# Patient Record
Sex: Male | Born: 1970 | Race: Black or African American | Hispanic: No | Marital: Single | Smoking: Current every day smoker
Health system: Southern US, Community
[De-identification: ages and names within clinical notes are randomized; demographics above are authoritative.]

## PROBLEM LIST (undated history)

## (undated) DIAGNOSIS — C8190 Hodgkin lymphoma, unspecified, unspecified site: Secondary | ICD-10-CM

## (undated) DIAGNOSIS — R590 Localized enlarged lymph nodes: Secondary | ICD-10-CM

## (undated) DIAGNOSIS — J40 Bronchitis, not specified as acute or chronic: Secondary | ICD-10-CM

## (undated) HISTORY — PX: INCISION AND DRAINAGE PERITONSILLAR ABSCESS: SUR670

## (undated) HISTORY — PX: WRIST SURGERY: SHX841

## (undated) HISTORY — PX: KNEE SURGERY: SHX244

---

## 2001-04-27 ENCOUNTER — Inpatient Hospital Stay (HOSPITAL_COMMUNITY): Admission: EM | Admit: 2001-04-27 | Discharge: 2001-04-29 | Payer: Self-pay | Admitting: Emergency Medicine

## 2001-05-18 ENCOUNTER — Encounter: Admission: RE | Admit: 2001-05-18 | Discharge: 2001-08-16 | Payer: Self-pay | Admitting: Orthopedic Surgery

## 2001-08-17 ENCOUNTER — Encounter: Admission: RE | Admit: 2001-08-17 | Discharge: 2001-09-06 | Payer: Self-pay | Admitting: Orthopedic Surgery

## 2002-03-28 ENCOUNTER — Emergency Department (HOSPITAL_COMMUNITY): Admission: EM | Admit: 2002-03-28 | Discharge: 2002-03-28 | Payer: Self-pay | Admitting: Emergency Medicine

## 2002-08-23 ENCOUNTER — Emergency Department (HOSPITAL_COMMUNITY): Admission: EM | Admit: 2002-08-23 | Discharge: 2002-08-23 | Payer: Self-pay | Admitting: Emergency Medicine

## 2002-08-23 ENCOUNTER — Encounter: Payer: Self-pay | Admitting: Emergency Medicine

## 2003-09-09 ENCOUNTER — Emergency Department (HOSPITAL_COMMUNITY): Admission: EM | Admit: 2003-09-09 | Discharge: 2003-09-10 | Payer: Self-pay | Admitting: Emergency Medicine

## 2004-06-22 ENCOUNTER — Emergency Department (HOSPITAL_COMMUNITY): Admission: EM | Admit: 2004-06-22 | Discharge: 2004-06-22 | Payer: Self-pay | Admitting: Emergency Medicine

## 2005-10-23 ENCOUNTER — Emergency Department (HOSPITAL_COMMUNITY): Admission: EM | Admit: 2005-10-23 | Discharge: 2005-10-23 | Payer: Self-pay | Admitting: Emergency Medicine

## 2010-04-12 ENCOUNTER — Emergency Department (HOSPITAL_COMMUNITY): Admission: EM | Admit: 2010-04-12 | Discharge: 2010-04-12 | Payer: Self-pay | Admitting: Emergency Medicine

## 2010-11-13 LAB — BASIC METABOLIC PANEL
Chloride: 95 mEq/L — ABNORMAL LOW (ref 96–112)
Potassium: 3.4 mEq/L — ABNORMAL LOW (ref 3.5–5.1)

## 2010-11-13 LAB — DIFFERENTIAL
Basophils Absolute: 0 10*3/uL (ref 0.0–0.1)
Eosinophils Absolute: 0 10*3/uL (ref 0.0–0.7)
Eosinophils Relative: 0 % (ref 0–5)
Lymphs Abs: 1.6 10*3/uL (ref 0.7–4.0)
Monocytes Absolute: 1.2 10*3/uL — ABNORMAL HIGH (ref 0.1–1.0)
Monocytes Relative: 12 % (ref 3–12)
Neutrophils Relative %: 72 % (ref 43–77)

## 2010-11-13 LAB — CBC
HCT: 44.4 % (ref 39.0–52.0)
MCH: 30.2 pg (ref 26.0–34.0)
MCHC: 33.7 g/dL (ref 30.0–36.0)
Platelets: 173 10*3/uL (ref 150–400)

## 2011-01-15 NOTE — Op Note (Signed)
[place]  Patient:    Robert Acevedo, [facility] Visit Number: [id number] MRN: [id number]          Service Type: MED Location: [id number] Attending Physician:  Dominica Severin Dictated by:   Elisha Ponder, M.D. Admit Date:  04/27/2001   CC:         [name], M.D., [facility], [place], [place]   Operative Report  PREOPERATIVE DIAGNOSIS:  Right middistal forearm laceration with ulnar nerve, ulnar artery, FDP, FDS, and FCU lacerations.  POSTOPERATIVE DIAGNOSIS:  Right middistal forearm laceration with ulnar nerve, ulnar artery, FDP, FDS, and FCU lacerations.  PROCEDURE: 1. I and D, skin and subcutaneous tissue, and tendon, right forearm, secondary to glass injury. 2. Repair of flexor digitorum superficialis to the index finger, middle finger, ring finger, and small finger. 3. Repair of flexor digitorum profundus to the middle finger, ring finger, and small finger. 4. Repair of FCU at the distal middle wrist level. 5. Repair of ulnar nerve, epineural technique, with microscope. 6. Repair of ulnar artery with microscope. 7. Fasciotomy, forearm, right upper extremity.  SURGEON:  Elisha Ponder, M.D.  ASSISTANT:  ______  ANESTHESIA:  General.  COMPLICATIONS:  None orthopedically that were immediate.  TOURNIQUET TIME:  Less than 2 hours.  DRAINS: One.  INDICATIONS FOR PROCEDURE:  This patient is a 40 year old white male who presents ______ He has been given tetanus prophylaxis.  He was given antibiotics during the procedure in the form of 2 gm of Ancef.  I have counseled him regarding risks and benefits of surgery.  I have discussed an ulnar nerve laceration is very significant and that he will have a very prolonged recovery period.  He may ultimately require tendon transfers, etc. I have given him no guarantees due to the fact that he has an ulnar nerve injury.  I have discussed with him that he needs to quit smoking  permanently. He understands this preoperatively and desires to proceed.  OPERATIVE FINDINGS:  The patient had a laceration to the FCU, FDS to the index and to small finger, and FDP to the middle finger, ring finger, and small finger.  He also had a laceration to the ulnar nerve and artery.  He ______ all structures and tendinous repairs were at the musculotendinous junction, but I was able to achieve tendon substance approximation and recreate the finger cascade.  PROCEDURE:  ______ anesthesia, he was taken to the operative suite and underwent smooth induction of general endotracheal anesthesia.  He was placed supine and appropriately padded and prepped and draped in the usual sterile fashion about the right upper extremity.  Betadine scrub and paint were placed.  Following this, a sterile field was isolated and secured.  Once this was done, the tourniquet was insufflated to 250 mmHg, and the patient underwent a distal and proximal extension to the incision.  Skin flaps were created.  The patient then underwent a thorough fasciotomy to prevent later swelling, etc.  Following this, the patient underwent I and D of skin and subcutaneous tissue, muscle, and tendon.  This was done to my satisfaction without difficulty.  This was excisional debridement without difficulty. Large amounts of copious irrigation was placed in the wound.  Once this was done, all tendinous structures were found as noted previously.  FDP, FDS, and ulnar nerves and arteries, as well as FCU were identified and were noted to be lacerated.  The profundus tendon to the index finger, as well as the  median nerve were identified and were noted to be intact.  I was very pleased wit the fact that the median nerve was lacerated; however, the patient certainly had an ulnar nerve laceration, and this will portend to a poor prognosis in the future, given that this was a complete laceration in the midforearm level. Once all  structures were identified in the median nerve, as well as FPL and the profundus and index finger were noted to be intact, I then proceeded with repair.  The FCU was repaired with Mersilene/Ethibond type suture of nonabsorbable braided suture without difficulty.  Multiple throws were placed in a modified Kessler ______ fashion.  Following this, the patient underwent ulnar nerve repair.  The microscope was brought in. An epineurial technique was used to repair the nerve. The nerve was not placed under any excessive tension.  I was able to get a very nice epineurial repair and align the fascicles.  I took care to try to recreate fascicular cascade, etc.  The patients dorsal ulnar motor branches about the nerve were kept in mind.  This was done without difficulty, and I was very happy with the nerve repair under the microscope.  Following this, the ulnar nerve was protected, and the patient underwent repair of the ulnar artery.  The patient had proximal and distal dissection.  Serrated scissors were used to freshen the ends of the artery.  A vessel clamp was placed.  The tourniquet was deflated at this point, and the patient then had proximal and distal flow re-established without difficulty.  He had excellent flow, both retrograde and antegrade. Following this, he underwent a repair with interrupted 8-0 and 9-0 nylon combination.  Once this was done, the clamp was removed, and the patient had an excellent patent artery.  I was very happy with the nerve and artery repair under the microscope.  This was done without difficulty.  Following this, the FDP was repaired deeply.  This was FDPd to the middle finger, small finger, and ring finger.  Following repair of this, the patient had a similar repair of the FDS tendon.  This was at the musculotendinous junction.  Index to small finger were repaired with a nonabsorbable braided suture.  This was done in a modified Kessler fashion without  difficulty.  I was very pleased with the repair.  I should note the median nerve and FDP to the index finger and FDL  were intact.  These were explored.  Thus, the patient underwent tendon repairs about the FDS, index through small, FDP, middle, ring, and small FCU and ulnar nerve and artery.  This was done without difficulty.  Following this, the fasciotomy was checked.  There was no excessive pressure.  The tourniquet was deflated.  The tourniquet time was less than two hours.  The patient had the tendon ends sutured with the tourniquet down. The tourniquet was not reinflated after the ulnar artery was repaired.  Following this, the patient had the size 10 TLS drain placed without difficulty, extending up proximally. The patient then underwent repair with Vicryl, followed by Prolene suture at the skin edge.  The area was then cleansed, and the patient had Adaptic placed.  20 cc of Marcaine were placed for postop analgesia.  The patient then underwent a sterile compressive dressing without excessive tightness.  A long arm splint with the wrist and fingers in flexion was placed without difficulty.  He was awakened from anesthesia without difficulty.  He presented to the recovery room.  We  will monitor him closely.  He will be placed on antibiotics, elevation, general wound precautions, and monitored closely with postop pain measures, etc.  It has been a pleasure to participate in his care.  ______ Dictated by:   Elisha Ponder, M.D. Attending Physician:  Dominica Severin DD:  04/27/01 TD:  04/28/01 Job: [id number]

## 2012-07-13 ENCOUNTER — Encounter (HOSPITAL_COMMUNITY): Payer: Self-pay | Admitting: *Deleted

## 2012-07-13 ENCOUNTER — Emergency Department (HOSPITAL_COMMUNITY)
Admission: EM | Admit: 2012-07-13 | Discharge: 2012-07-13 | Disposition: A | Discharge status: Home / Self Care | Payer: Self-pay | Attending: Emergency Medicine | Admitting: Emergency Medicine

## 2012-07-13 DIAGNOSIS — K0889 Other specified disorders of teeth and supporting structures: Secondary | ICD-10-CM

## 2012-07-13 DIAGNOSIS — R6884 Jaw pain: Secondary | ICD-10-CM | POA: Insufficient documentation

## 2012-07-13 DIAGNOSIS — F17200 Nicotine dependence, unspecified, uncomplicated: Secondary | ICD-10-CM | POA: Insufficient documentation

## 2012-07-13 DIAGNOSIS — K089 Disorder of teeth and supporting structures, unspecified: Secondary | ICD-10-CM | POA: Insufficient documentation

## 2012-07-13 DIAGNOSIS — R221 Localized swelling, mass and lump, neck: Secondary | ICD-10-CM | POA: Insufficient documentation

## 2012-07-13 DIAGNOSIS — R220 Localized swelling, mass and lump, head: Secondary | ICD-10-CM | POA: Insufficient documentation

## 2012-07-13 MED ORDER — HYDROCODONE-ACETAMINOPHEN 5-325 MG PO TABS: 1.0000 | ORAL_TABLET | ORAL | Status: DC | PRN

## 2012-07-13 MED ORDER — AMOXICILLIN 500 MG PO CAPS: 500.0000 mg | ORAL_CAPSULE | Freq: Three times a day (TID) | ORAL | Status: DC

## 2012-07-13 NOTE — ED Notes (Signed)
Dental pain to left side since yesterday. 

## 2012-07-13 NOTE — ED Provider Notes (Signed)
History     CSN: [id number]  Arrival date & time 07/13/12  1541   First MD Initiated Contact with Patient 07/13/12 1608      Chief Complaint  Patient presents with  . Dental Pain    (Consider location/radiation/quality/duration/timing/severity/associated sxs/prior treatment) Patient is a 41 y.o. male presenting with tooth pain. The history is provided by the patient.  Dental PainThe primary symptoms include mouth pain. Primary symptoms do not include shortness of breath or cough. The symptoms began yesterday. The symptoms are worsening. The symptoms occur constantly.  Additional symptoms include: gum swelling and jaw pain. Additional symptoms do not include: nosebleeds. Medical issues include: smoking.    History reviewed. No pertinent past medical history.  Past Surgical History  Procedure Date  . Wrist surgery     right    No family history on file.  History  Substance Use Topics  . Smoking status: Current Every Day Smoker    Types: Cigarettes  . Smokeless tobacco: Not on file  . Alcohol Use: No      Review of Systems  Constitutional: Negative for activity change.       All ROS Neg except as noted in HPI  HENT: Positive for dental problem. Negative for nosebleeds and neck pain.   Eyes: Negative for photophobia and discharge.  Respiratory: Negative for cough, shortness of breath and wheezing.   Cardiovascular: Negative for chest pain and palpitations.  Gastrointestinal: Negative for abdominal pain and blood in stool.  Genitourinary: Negative for dysuria, frequency and hematuria.  Musculoskeletal: Negative for back pain and arthralgias.  Skin: Negative.   Neurological: Negative for dizziness, seizures and speech difficulty.  Psychiatric/Behavioral: Negative for hallucinations and confusion.    Allergies  Review of patient's allergies indicates no known allergies.  Home Medications   Current Outpatient Rx  Name  Route  Sig  Dispense  Refill  . NAPROXEN  SODIUM 220 MG PO CAPS   Oral   Take 1 capsule by mouth once as needed. For pain           BP 131/84  Pulse 92  Temp 99 F (37.2 C) (Oral)  Resp 20  Ht 5\' 9"  (1.753 m)  Wt 195 lb (88.451 kg)  BMI 28.80 kg/m2  SpO2 100%  Physical Exam  Nursing note and vitals reviewed. Constitutional: He is oriented to person, place, and time. He appears well-developed and well-nourished.  Non-toxic appearance.  HENT:  Head: Normocephalic.  Right Ear: Tympanic membrane and external ear normal.  Left Ear: Tympanic membrane and external ear normal.       Dark discoloration of the gums noted. Multiple dental caries present. The left upper 2nd molar is decayed and broken at the gum line.  No visible abscess. Airway patent. No swelling under the tongue.  Eyes: EOM and lids are normal. Pupils are equal, round, and reactive to light.  Neck: Normal range of motion. Neck supple. Carotid bruit is not present.  Cardiovascular: Normal rate, regular rhythm, normal heart sounds, intact distal pulses and normal pulses.   Pulmonary/Chest: Breath sounds normal. No respiratory distress.  Abdominal: Soft. Bowel sounds are normal. There is no tenderness. There is no guarding.  Musculoskeletal: Normal range of motion.  Lymphadenopathy:       Head (right side): No submandibular adenopathy present.       Head (left side): No submandibular adenopathy present.    He has no cervical adenopathy.  Neurological: He is alert and oriented to person, place, and time.  He has normal strength. No cranial nerve deficit or sensory deficit.  Skin: Skin is warm and dry.  Psychiatric: He has a normal mood and affect. His speech is normal.    ED Course  Procedures (including critical care time)  Labs Reviewed - No data to display No results found.   No diagnosis found.    MDM  I have reviewed nursing notes, vital signs, and all appropriate lab and imaging results for this patient. Pt has a cavity and dental fx of the left  upper 2nd molar. He is waiting for his insurance in order to see a dentist. He is advised to use 2 aleeve daily. He is to use amoxil daily until all taken and Rx for Norco #20 tabs given.       Kathie Dike, PA 07/13/12 1624  Kathie Dike, [place] 07/13/12 1626

## 2012-07-14 NOTE — ED Provider Notes (Signed)
History/physical exam/procedure(s) were performed by non-physician practitioner and as supervising physician I was immediately available for consultation/collaboration. I have reviewed all notes and am in agreement with care and plan.   Hilario Quarry, MD 07/14/12 [phone]

## 2012-07-15 ENCOUNTER — Encounter (HOSPITAL_COMMUNITY): Payer: Self-pay

## 2012-07-15 ENCOUNTER — Emergency Department (HOSPITAL_COMMUNITY)
Admission: EM | Admit: 2012-07-15 | Discharge: 2012-07-15 | Disposition: A | Discharge status: Home / Self Care | Payer: Self-pay | Attending: Emergency Medicine | Admitting: Emergency Medicine

## 2012-07-15 DIAGNOSIS — K047 Periapical abscess without sinus: Secondary | ICD-10-CM | POA: Insufficient documentation

## 2012-07-15 DIAGNOSIS — F17200 Nicotine dependence, unspecified, uncomplicated: Secondary | ICD-10-CM | POA: Insufficient documentation

## 2012-07-15 DIAGNOSIS — K089 Disorder of teeth and supporting structures, unspecified: Secondary | ICD-10-CM | POA: Insufficient documentation

## 2012-07-15 DIAGNOSIS — K0889 Other specified disorders of teeth and supporting structures: Secondary | ICD-10-CM

## 2012-07-15 MED ORDER — HYDROMORPHONE HCL PF 1 MG/ML IJ SOLN
1.0000 mg | Freq: Once | INTRAMUSCULAR | Status: AC
  Administered 2012-07-15: 1 mg via INTRAMUSCULAR
  Filled 2012-07-15: qty 1

## 2012-07-15 MED ORDER — OXYCODONE-ACETAMINOPHEN 5-325 MG PO TABS: 1.0000 | ORAL_TABLET | ORAL | Status: AC | PRN

## 2012-07-15 MED ORDER — OXYCODONE-ACETAMINOPHEN 5-325 MG PO TABS
1.0000 | ORAL_TABLET | Freq: Once | ORAL | Status: AC
  Administered 2012-07-15: 1 via ORAL
  Filled 2012-07-15: qty 1

## 2012-07-15 MED ORDER — IBUPROFEN 600 MG PO TABS: 600.0000 mg | ORAL_TABLET | Freq: Four times a day (QID) | ORAL | Status: DC | PRN

## 2012-07-15 NOTE — ED Notes (Signed)
Dental pain that has became worse. Got meds on the 14th of November and it is not helping per pt.

## 2012-07-17 NOTE — ED Provider Notes (Signed)
History     CSN: [id number]  Arrival date & time 07/15/12  2101   First MD Initiated Contact with Patient 07/15/12 2116      Chief Complaint  Patient presents with  . Dental Pain    (Consider location/radiation/quality/duration/timing/severity/associated sxs/prior treatment) HPI Comments: Robert Acevedo presents for further management of dental pain and abscess.  He was seen here 2 days ago and placed on amoxicillin and hydrocodone which she states is not relieving his pain.  He received relief when he first started taking this pain medication but states he spent last night unable to sleep secondary to pain.  He has started taking the amoxicillin as instructed.  He is currently awaiting followup with a local dentist.  He denies fevers and chills and has had no worsened gingival swelling, sore throat or swelling.  He is tearful during this exam.  The history is provided by the patient and the spouse.    History reviewed. No pertinent past medical history.  Past Surgical History  Procedure Date  . Wrist surgery     right    No family history on file.  History  Substance Use Topics  . Smoking status: Current Every Day Smoker    Types: Cigarettes  . Smokeless tobacco: Not on file  . Alcohol Use: No      Review of Systems  Constitutional: Negative for fever.  HENT: Positive for dental problem. Negative for sore throat, facial swelling, neck pain and neck stiffness.   Respiratory: Negative for shortness of breath.     Allergies  Review of patient's allergies indicates no known allergies.  Home Medications   Current Outpatient Rx  Name  Route  Sig  Dispense  Refill  . AMOXICILLIN 500 MG PO CAPS   Oral   Take 1 capsule (500 mg total) by mouth 3 (three) times daily.   21 capsule   0   . HYDROCODONE-ACETAMINOPHEN 5-325 MG PO TABS   Oral   Take 1 tablet by mouth every 4 (four) hours as needed for pain.   20 tablet   0   . NAPROXEN SODIUM 220 MG PO CAPS  Oral   Take 1 capsule by mouth once as needed. For pain         . IBUPROFEN 600 MG PO TABS   Oral   Take 1 tablet (600 mg total) by mouth every 6 (six) hours as needed for pain.   30 tablet   0   . OXYCODONE-ACETAMINOPHEN 5-325 MG PO TABS   Oral   Take 1 tablet by mouth every 4 (four) hours as needed for pain.   15 tablet   0     BP 129/74  Pulse 70  Temp 99 F (37.2 C) (Oral)  Resp 16  Ht 5\' 9"  (1.753 m)  Wt 195 lb (88.451 kg)  BMI 28.80 kg/m2  SpO2 100%  Physical Exam  Constitutional: He is oriented to person, place, and time. He appears well-developed and well-nourished. No distress.  HENT:  Head: Normocephalic and atraumatic. No trismus in the jaw.  Right Ear: Tympanic membrane and external ear normal.  Left Ear: Tympanic membrane and external ear normal.  Mouth/Throat: Oropharynx is clear and moist and mucous membranes are normal. No oral lesions. Dental abscesses present.    Eyes: Conjunctivae normal are normal.  Neck: Normal range of motion. Neck supple.  Cardiovascular: Normal rate and normal heart sounds.   Pulmonary/Chest: Effort normal.  Abdominal: He exhibits no distension.  Musculoskeletal: Normal range of motion.  Lymphadenopathy:    He has no cervical adenopathy.  Neurological: He is alert and oriented to person, place, and time.  Skin: Skin is warm and dry. No erythema.  Psychiatric: He has a normal mood and affect.    ED Course  Procedures (including critical care time)  Labs Reviewed - No data to display No results found.   1. Pain, dental   2. Dental abscess       MDM  Patient was given an IM injection of Dilaudid.  Was also given a prescription for Percocet and instructed to use this in place of his hydrocodone.  Also advised adding ibuprofen which was also prescribed.  Dental referrals given as well.      [name], PA 07/17/12 0100

## 2012-07-17 NOTE — ED Provider Notes (Signed)
Medical screening examination/treatment/procedure(s) were performed by non-physician practitioner and as supervising physician I was immediately available for consultation/collaboration.   Manning Luna L Avantika Shere, MD 07/17/12 1553 

## 2012-08-07 ENCOUNTER — Emergency Department (HOSPITAL_COMMUNITY)
Admission: EM | Admit: 2012-08-07 | Discharge: 2012-08-07 | Disposition: A | Discharge status: Home / Self Care | Payer: Self-pay | Attending: Emergency Medicine | Admitting: Emergency Medicine

## 2012-08-07 ENCOUNTER — Encounter (HOSPITAL_COMMUNITY): Payer: Self-pay

## 2012-08-07 DIAGNOSIS — R599 Enlarged lymph nodes, unspecified: Secondary | ICD-10-CM | POA: Insufficient documentation

## 2012-08-07 DIAGNOSIS — R590 Localized enlarged lymph nodes: Secondary | ICD-10-CM

## 2012-08-07 DIAGNOSIS — K047 Periapical abscess without sinus: Secondary | ICD-10-CM

## 2012-08-07 DIAGNOSIS — R21 Rash and other nonspecific skin eruption: Secondary | ICD-10-CM

## 2012-08-07 DIAGNOSIS — L299 Pruritus, unspecified: Secondary | ICD-10-CM | POA: Insufficient documentation

## 2012-08-07 DIAGNOSIS — F17200 Nicotine dependence, unspecified, uncomplicated: Secondary | ICD-10-CM | POA: Insufficient documentation

## 2012-08-07 LAB — CBC WITH DIFFERENTIAL/PLATELET
Lymphocytes Relative: 44 % (ref 12–46)
MCHC: 33.3 g/dL (ref 30.0–36.0)
Monocytes Absolute: 0.7 10*3/uL (ref 0.1–1.0)
Monocytes Relative: 15 % — ABNORMAL HIGH (ref 3–12)
Neutrophils Relative %: 38 % — ABNORMAL LOW (ref 43–77)
Platelets: 173 10*3/uL (ref 150–400)

## 2012-08-07 LAB — RPR: RPR Ser Ql: NONREACTIVE

## 2012-08-07 MED ORDER — OXYCODONE-ACETAMINOPHEN 5-325 MG PO TABS
1.0000 | ORAL_TABLET | Freq: Once | ORAL | Status: AC
  Administered 2012-08-07: 1 via ORAL
  Filled 2012-08-07: qty 1

## 2012-08-07 MED ORDER — AMOXICILLIN 250 MG PO CAPS
500.0000 mg | ORAL_CAPSULE | Freq: Once | ORAL | Status: AC
  Administered 2012-08-07: 500 mg via ORAL
  Filled 2012-08-07: qty 2

## 2012-08-07 MED ORDER — AMOXICILLIN 500 MG PO CAPS: 500.0000 mg | ORAL_CAPSULE | Freq: Three times a day (TID) | ORAL | Status: AC

## 2012-08-07 MED ORDER — OXYCODONE-ACETAMINOPHEN 5-325 MG PO TABS: 1.0000 | ORAL_TABLET | ORAL | Status: AC | PRN

## 2012-08-07 NOTE — ED Provider Notes (Signed)
Medical screening examination/treatment/procedure(s) were performed by non-physician practitioner and as supervising physician I was immediately available for consultation/collaboration.  Geoffery Lyons, MD 08/07/12 1539

## 2012-08-07 NOTE — ED Provider Notes (Signed)
History     CSN: [id number]  Arrival date & time 08/07/12  4098   First MD Initiated Contact with Patient 08/07/12 [phone]      Chief Complaint  Patient presents with  . Dental Pain    (Consider location/radiation/quality/duration/timing/severity/associated sxs/prior treatment) HPI Comments: Robert Acevedo presents with left upper posterior molar dental pain, gingival swelling and now drainage since yesterday.  This is a chronic condition, he was last placed on amoxicillin one month ago which has temporarily improved his symptoms.  He is not yet been able to get in with the dentist.  He denies fevers or chills and has been able to swallow without difficulty.  He also has complaints of generalized rash which is pruritic, but only when he rubs it and has been present for the past one to 2 months.  Also mentions a tender lymph node in his left groin which has been present since August and was placed on a course of antibiotics by the health department and the swelling is now improved but still present.  He denies weakness, dizziness and has been afebrile.  He has had no wounds or lesions.  He states he was screened for all STDs when he was treated for this tender lymph node in his tests were negative.  He denies penile discharge and dysuria.  The history is provided by the patient and the spouse.    History reviewed. No pertinent past medical history.  Past Surgical History  Procedure Date  . Wrist surgery     right    No family history on file.  History  Substance Use Topics  . Smoking status: Current Every Day Smoker    Types: Cigarettes  . Smokeless tobacco: Not on file  . Alcohol Use: No      Review of Systems  Constitutional: Negative for fever.  HENT: Positive for dental problem. Negative for sore throat, facial swelling, neck pain and neck stiffness.   Respiratory: Negative for shortness of breath.   Skin: Positive for rash.  Hematological: Positive for adenopathy.  Does not bruise/bleed easily.    Allergies  Review of patient's allergies indicates no known allergies.  Home Medications   Current Outpatient Rx  Name  Route  Sig  Dispense  Refill  . AMOXICILLIN 500 MG PO CAPS   Oral   Take 1 capsule (500 mg total) by mouth 3 (three) times daily.   30 capsule   0   . OXYCODONE-ACETAMINOPHEN 5-325 MG PO TABS   Oral   Take 1 tablet by mouth every 4 (four) hours as needed for pain.   15 tablet   0     BP 121/73  Pulse 82  Temp 98.6 F (37 C) (Oral)  Resp 18  Ht 5\' 9"  (1.753 m)  Wt 195 lb (88.451 kg)  BMI 28.80 kg/m2  SpO2 100%  Physical Exam  Constitutional: He is oriented to person, place, and time. He appears well-developed and well-nourished. No distress.  HENT:  Head: Normocephalic and atraumatic. No trismus in the jaw.  Right Ear: Tympanic membrane and external ear normal.  Left Ear: Tympanic membrane and external ear normal.  Mouth/Throat: Oropharynx is clear and moist and mucous membranes are normal. No oral lesions. Dental abscesses present.    Eyes: Conjunctivae normal are normal.  Neck: Normal range of motion. Neck supple.  Cardiovascular: Normal rate and normal heart sounds.   Pulmonary/Chest: Effort normal.  Abdominal: He exhibits no distension.  Musculoskeletal: Normal range of motion.  Lymphadenopathy:    He has no cervical adenopathy.       Left: Inguinal adenopathy present.       Left inguinal lymph node is enlarged, nontender, mobile with no surrounding edema or erythema.  Skin appears healthy.  Neurological: He is alert and oriented to person, place, and time.  Skin: Skin is warm and dry. No erythema.       Patient has several grouped areas of flattened vesicles, largest grouping approximately 1 cm on right anterior tibia.  He had an additional patch on his left tibia, left upper thigh, left abdomen and bilateral forearms.  Psychiatric: He has a normal mood and affect.    ED Course  Procedures (including  critical care time)  Labs Reviewed  CBC WITH DIFFERENTIAL - Abnormal; Notable for the following:    Neutrophils Relative 38 (*)     Monocytes Relative 15 (*)     All other components within normal limits  RPR   No results found.   1. Dental abscess   2. Rash   3. Inguinal lymphadenopathy       MDM  Given atypical nonspecific rash and lymphadenopathy will re\re screen patient for possible syphilis, RPR pending at this time.  CBC also obtained.  Will treat dental infection with Amoxil and hydrocodone.  Referral to dentistry and also advised further management of his other complaints by his PCP.        Robert Amor, PA 08/07/12 1026

## 2012-08-07 NOTE — ED Notes (Addendum)
Pt reports has toothache in left side of mouth x 2 days.   Pt also reports fine rash to arms, left side, r lower leg since Monday.

## 2012-08-07 NOTE — ED Notes (Signed)
Pt c/o dental pain x 2 days with rash to arms, trunk, and legs since yesterday.

## 2012-11-05 ENCOUNTER — Encounter (HOSPITAL_COMMUNITY): Payer: Self-pay

## 2012-11-05 ENCOUNTER — Emergency Department (HOSPITAL_COMMUNITY)
Admission: EM | Admit: 2012-11-05 | Discharge: 2012-11-05 | Disposition: A | Discharge status: Home / Self Care | Payer: Self-pay | Attending: Emergency Medicine | Admitting: Emergency Medicine

## 2012-11-05 DIAGNOSIS — IMO0001 Reserved for inherently not codable concepts without codable children: Secondary | ICD-10-CM | POA: Insufficient documentation

## 2012-11-05 DIAGNOSIS — R059 Cough, unspecified: Secondary | ICD-10-CM | POA: Insufficient documentation

## 2012-11-05 DIAGNOSIS — K047 Periapical abscess without sinus: Secondary | ICD-10-CM | POA: Insufficient documentation

## 2012-11-05 DIAGNOSIS — R443 Hallucinations, unspecified: Secondary | ICD-10-CM | POA: Insufficient documentation

## 2012-11-05 DIAGNOSIS — F17200 Nicotine dependence, unspecified, uncomplicated: Secondary | ICD-10-CM | POA: Insufficient documentation

## 2012-11-05 DIAGNOSIS — R509 Fever, unspecified: Secondary | ICD-10-CM | POA: Insufficient documentation

## 2012-11-05 DIAGNOSIS — Z79899 Other long term (current) drug therapy: Secondary | ICD-10-CM | POA: Insufficient documentation

## 2012-11-05 DIAGNOSIS — R110 Nausea: Secondary | ICD-10-CM | POA: Insufficient documentation

## 2012-11-05 MED ORDER — AMOXICILLIN 250 MG PO CAPS
500.0000 mg | ORAL_CAPSULE | Freq: Once | ORAL | Status: AC
  Administered 2012-11-05: 500 mg via ORAL
  Filled 2012-11-05: qty 2

## 2012-11-05 MED ORDER — AMOXICILLIN 500 MG PO CAPS: 500.0000 mg | ORAL_CAPSULE | Freq: Three times a day (TID) | ORAL | Status: DC

## 2012-11-05 MED ORDER — OXYCODONE-ACETAMINOPHEN 5-325 MG PO TABS
1.0000 | ORAL_TABLET | Freq: Once | ORAL | Status: AC
  Administered 2012-11-05: 1 via ORAL
  Filled 2012-11-05: qty 1

## 2012-11-05 MED ORDER — ONDANSETRON 8 MG PO TBDP
8.0000 mg | ORAL_TABLET | Freq: Once | ORAL | Status: AC
  Administered 2012-11-05: 8 mg via ORAL
  Filled 2012-11-05: qty 1

## 2012-11-05 MED ORDER — HYDROCODONE-ACETAMINOPHEN 5-325 MG PO TABS: 1.0000 | ORAL_TABLET | ORAL | Status: DC | PRN

## 2012-11-05 NOTE — ED Notes (Signed)
Pt reports multiple toothaches for years. Has been seen in the er for same, has not been to his dentist.

## 2012-11-05 NOTE — ED Notes (Signed)
Pt tolerating apple sauce and ginger ale at this time.

## 2012-11-05 NOTE — ED Notes (Signed)
Pt presents with upper bilateral dental pain, no visible swelling noted. Pt also reports cold chills and diaphoresis at times.  NAD noted. Pt denies URI symptoms at this time.

## 2012-11-06 NOTE — ED Provider Notes (Signed)
History     CSN: [id number]  Arrival date & time 11/05/12  1713   First MD Initiated Contact with Patient 11/05/12 1843      Chief Complaint  Patient presents with  . Dental Pain    (Consider location/radiation/quality/duration/timing/severity/associated sxs/prior treatment) HPI Comments: Robert Acevedo is a 42 y.o. Male presenting with dental abscess and dental pain which started yesterday at the site of a right upper molar tooth which has had chronic decay and intermittent prior  Infections at this site.  He does not have a dentist.  He has develop mild nausea since arriving here and reduced appetite today along with chills and fever.  He denies nasal congestion,  Sore throat, headache, but has had generalized myalgias non productive cough and has had a reduced appetite today.  He has taken naproxen with no relief of symptoms.    The history is provided by the patient.    History reviewed. No pertinent past medical history.  Past Surgical History  Procedure Laterality Date  . Wrist surgery      right    No family history on file.  History  Substance Use Topics  . Smoking status: Current Every Day Smoker    Types: Cigarettes  . Smokeless tobacco: Not on file  . Alcohol Use: No      Review of Systems  Constitutional: Positive for fever, chills and diaphoresis.  HENT: Positive for dental problem. Negative for congestion, sore throat, facial swelling, rhinorrhea, neck pain and neck stiffness.   Respiratory: Positive for cough. Negative for shortness of breath, wheezing and stridor.   Gastrointestinal: Positive for nausea. Negative for vomiting.    Allergies  Review of patient's allergies indicates no known allergies.  Home Medications   Current Outpatient Rx  Name  Route  Sig  Dispense  Refill  . Naproxen Sodium (ALEVE) 220 MG CAPS   Oral   Take 440 mg by mouth once as needed (for pain).         Marland Kitchen amoxicillin (AMOXIL) 500 MG capsule   Oral   Take 1  capsule (500 mg total) by mouth 3 (three) times daily.   21 capsule   0   . HYDROcodone-acetaminophen (NORCO/VICODIN) 5-325 MG per tablet   Oral   Take 1 tablet by mouth every 4 (four) hours as needed for pain.   20 tablet   0     BP 120/72  Pulse 111  Temp(Src) 100.8 F (38.2 C) (Oral)  Resp 20  Ht 5\' 9"  (1.753 m)  Wt 185 lb (83.915 kg)  BMI 27.31 kg/m2  SpO2 100%  Physical Exam  Constitutional: He is oriented to person, place, and time. He appears well-developed and well-nourished. No distress.  HENT:  Head: Normocephalic and atraumatic.  Right Ear: Tympanic membrane and external ear normal.  Left Ear: Tympanic membrane and external ear normal.  Mouth/Throat: Oropharynx is clear and moist and mucous membranes are normal. No oral lesions. Dental abscesses present.    Tender slightly raised abscess gingival superior to #3, no drainage or fluctuance.  This tooth does have decay on occlusive surface.  No cellulitis, no edema or swelling of mandible or cheek.  Eyes: Conjunctivae are normal.  Neck: Normal range of motion. Neck supple.  Cardiovascular: Normal rate and normal heart sounds.   Pulmonary/Chest: Effort normal. He has no decreased breath sounds. He has no wheezes. He has no rhonchi. He has no rales.  Abdominal: He exhibits no distension.  Musculoskeletal: Normal range  of motion.  Lymphadenopathy:    He has no cervical adenopathy.  Neurological: He is alert and oriented to person, place, and time.  Skin: Skin is warm and dry. No erythema.  Psychiatric: He has a normal mood and affect.    ED Course  Procedures (including critical care time)  Labs Reviewed - No data to display No results found.   1. Dental abscess     Pt was given zofran due to nausea,  Then tolerated both fluids and apple sauce prior to dc home.  Given amoxil, percocet for dental infection.  MDM  Pt with classic dental abscess, with no signs of cellulitis or advanced infection.  Suspect  fever and chills from early viral process.  I do not think fever is associated with dental infection.  He was prescribed amoxil, hydrocodone.  Dental referrals given.  Advised to rest,  Hydrate, return here in the interim for any worsened sx.          [name], PA-C 11/06/12 2102

## 2012-11-07 NOTE — ED Provider Notes (Signed)
Medical screening examination/treatment/procedure(s) were performed by non-physician practitioner and as supervising physician I was immediately available for consultation/collaboration.   Shelda Jakes, MD 11/07/12 [phone]

## 2013-03-12 ENCOUNTER — Emergency Department (HOSPITAL_COMMUNITY)
Admission: EM | Admit: 2013-03-12 | Discharge: 2013-03-12 | Disposition: A | Discharge status: Home / Self Care | Payer: Self-pay | Attending: Emergency Medicine | Admitting: Emergency Medicine

## 2013-03-12 ENCOUNTER — Encounter (HOSPITAL_COMMUNITY): Payer: Self-pay | Admitting: Emergency Medicine

## 2013-03-12 DIAGNOSIS — K089 Disorder of teeth and supporting structures, unspecified: Secondary | ICD-10-CM | POA: Insufficient documentation

## 2013-03-12 DIAGNOSIS — F17200 Nicotine dependence, unspecified, uncomplicated: Secondary | ICD-10-CM | POA: Insufficient documentation

## 2013-03-12 DIAGNOSIS — K029 Dental caries, unspecified: Secondary | ICD-10-CM | POA: Insufficient documentation

## 2013-03-12 DIAGNOSIS — K0889 Other specified disorders of teeth and supporting structures: Secondary | ICD-10-CM

## 2013-03-12 MED ORDER — AMOXICILLIN 500 MG PO CAPS: 500.0000 mg | ORAL_CAPSULE | Freq: Three times a day (TID) | ORAL | Status: DC

## 2013-03-12 MED ORDER — TRAMADOL HCL 50 MG PO TABS: 50.0000 mg | ORAL_TABLET | Freq: Four times a day (QID) | ORAL | Status: DC | PRN

## 2013-03-12 NOTE — ED Provider Notes (Signed)
Medical screening examination/treatment/procedure(s) were performed by non-physician practitioner and as supervising physician I was immediately available for consultation/collaboration.   Shelda Jakes, MD 03/12/13 1640

## 2013-03-12 NOTE — ED Notes (Addendum)
Patient c/o left upper dental pain. Patient states "I had a dental abscess on Thursday about the size of a dime that popped on Saturday and now I have a whole in my mouth and a sharp pain that shoots in my ear. I have not been able since." Patient took one amoxicillin capsule this morning.

## 2013-03-12 NOTE — ED Provider Notes (Signed)
History    CSN: [id number] Arrival date & time 03/12/13  4098  First MD Initiated Contact with Patient 03/12/13 [phone]     Chief Complaint  Patient presents with  . Dental Pain   (Consider location/radiation/quality/duration/timing/severity/associated sxs/prior Treatment) HPI Comments: Robert Acevedo is a 42 y.o. male who presents to the Emergency Department complaining of dental pain.  He complains of worsening dental pain since Thursday.  Noticed a "bump" to his gums on Friday that began to drain and c/o worsening pain since then,  Tried salt water rinses w/o relief.  Describes a shooting pain from his jaw and into his ear.  He denies difficulty swallowing or breathing, fever, facial swelling or fever  Patient is a 42 y.o. male presenting with tooth pain. The history is provided by the patient.  Dental Pain Associated symptoms: no congestion, no facial swelling, no fever, no headaches and no neck pain    History reviewed. No pertinent past medical history. Past Surgical History  Procedure Laterality Date  . Wrist surgery      right  . Knee surgery    . Incision and drainage peritonsillar abscess     Family History  Problem Relation Age of Onset  . Seizures Mother    History  Substance Use Topics  . Smoking status: Current Every Day Smoker -- 0.02 packs/day for 2 years    Types: Cigarettes  . Smokeless tobacco: Never Used  . Alcohol Use: No    Review of Systems  Constitutional: Negative for fever and appetite change.  HENT: Positive for dental problem. Negative for congestion, sore throat, facial swelling, trouble swallowing, neck pain and neck stiffness.   Eyes: Negative for pain and visual disturbance.  Neurological: Negative for dizziness, facial asymmetry and headaches.  Hematological: Negative for adenopathy.  All other systems reviewed and are negative.    Allergies  Review of patient's allergies indicates no known allergies.  Home Medications    Current Outpatient Rx  Name  Route  Sig  Dispense  Refill  . amoxicillin (AMOXIL) 500 MG capsule   Oral   Take 500 mg by mouth 3 (three) times daily.         Marland Kitchen ibuprofen (ADVIL,MOTRIN) 200 MG tablet   Oral   Take 400 mg by mouth every 6 (six) hours as needed for pain.          BP 104/68  Pulse 86  Temp(Src) 98.6 F (37 C) (Oral)  Ht 5\' 9"  (1.753 m)  Wt 185 lb (83.915 kg)  BMI 27.31 kg/m2  SpO2 98% Physical Exam  Nursing note and vitals reviewed. Constitutional: He is oriented to person, place, and time. He appears well-developed and well-nourished. No distress.  HENT:  Head: Normocephalic and atraumatic. No trismus in the jaw.  Right Ear: Tympanic membrane and ear canal normal.  Left Ear: Tympanic membrane and ear canal normal.  Mouth/Throat: Uvula is midline, oropharynx is clear and moist and mucous membranes are normal. Dental caries present. No dental abscesses or edematous.    +ttp of the left upper molar with small draining abscess to the left upper gums.  No trismus or facial edema. Poor dental hygiene and multiple caries  Neck: Normal range of motion and phonation normal. Neck supple.  Cardiovascular: Normal rate, regular rhythm, normal heart sounds and intact distal pulses.   No murmur heard. Pulmonary/Chest: Effort normal and breath sounds normal.  Musculoskeletal: Normal range of motion.  Lymphadenopathy:    He has no cervical  adenopathy.  Neurological: He is alert and oriented to person, place, and time. He exhibits normal muscle tone. Coordination normal.  Skin: Skin is warm and dry.    ED Course  Procedures (including critical care time) Labs Reviewed - No data to display   MDM    Patient is well appearing. VSS.  Small draining abscess to the left upper gums. No trismus, facial edema or sublingual abnml.  Pt given referral info for dentist.    Tandy Lewin L. Trisha Mangle, PA-C 03/12/13 1036

## 2013-12-08 ENCOUNTER — Emergency Department (HOSPITAL_COMMUNITY)
Admission: EM | Admit: 2013-12-08 | Discharge: 2013-12-09 | Disposition: A | Discharge status: Home / Self Care | Payer: Self-pay | Attending: Emergency Medicine | Admitting: Emergency Medicine

## 2013-12-08 ENCOUNTER — Encounter (HOSPITAL_COMMUNITY): Payer: Self-pay | Admitting: Emergency Medicine

## 2013-12-08 DIAGNOSIS — F17200 Nicotine dependence, unspecified, uncomplicated: Secondary | ICD-10-CM | POA: Insufficient documentation

## 2013-12-08 DIAGNOSIS — W261XXA Contact with sword or dagger, initial encounter: Secondary | ICD-10-CM

## 2013-12-08 DIAGNOSIS — Y9389 Activity, other specified: Secondary | ICD-10-CM | POA: Insufficient documentation

## 2013-12-08 DIAGNOSIS — W260XXA Contact with knife, initial encounter: Secondary | ICD-10-CM | POA: Insufficient documentation

## 2013-12-08 DIAGNOSIS — IMO0002 Reserved for concepts with insufficient information to code with codable children: Secondary | ICD-10-CM

## 2013-12-08 DIAGNOSIS — Y92009 Unspecified place in unspecified non-institutional (private) residence as the place of occurrence of the external cause: Secondary | ICD-10-CM | POA: Insufficient documentation

## 2013-12-08 DIAGNOSIS — S61209A Unspecified open wound of unspecified finger without damage to nail, initial encounter: Secondary | ICD-10-CM | POA: Insufficient documentation

## 2013-12-08 MED ORDER — LIDOCAINE HCL (PF) 2 % IJ SOLN
INTRAMUSCULAR | Status: AC
  Filled 2013-12-08: qty 10

## 2013-12-08 NOTE — ED Notes (Signed)
Pt reports cutting tip of finger with butcher knife.  Bleeding controlled at this time.

## 2013-12-09 NOTE — Discharge Instructions (Signed)
Laceration Care, Adult °A laceration is a cut or lesion that goes through all layers of the skin and into the tissue just beneath the skin. °TREATMENT  °Some lacerations may not require closure. Some lacerations may not be able to be closed due to an increased risk of infection. It is important to see your caregiver as soon as possible after an injury to minimize the risk of infection and maximize the opportunity for successful closure. °If closure is appropriate, pain medicines may be given, if needed. The wound will be cleaned to help prevent infection. Your caregiver will use stitches (sutures), staples, wound glue (adhesive), or skin adhesive strips to repair the laceration. These tools bring the skin edges together to allow for faster healing and a better cosmetic outcome. However, all wounds will heal with a scar. Once the wound has healed, scarring can be minimized by covering the wound with sunscreen during the day for 1 full year. °HOME CARE INSTRUCTIONS  °For sutures or staples: °· Keep the wound clean and dry. °· If you were given a bandage (dressing), you should change it at least once a day. Also, change the dressing if it becomes wet or dirty, or as directed by your caregiver. °· Wash the wound with soap and water 2 times a day. Rinse the wound off with water to remove all soap. Pat the wound dry with a clean towel. °· After cleaning, apply a thin layer of the antibiotic ointment as recommended by your caregiver. This will help prevent infection and keep the dressing from sticking. °· You may shower as usual after the first 24 hours. Do not soak the wound in water until the sutures are removed. °· Only take over-the-counter or prescription medicines for pain, discomfort, or fever as directed by your caregiver. °· Get your sutures or staples removed as directed by your caregiver. °For skin adhesive strips: °· Keep the wound clean and dry. °· Do not get the skin adhesive strips wet. You may bathe  carefully, using caution to keep the wound dry. °· If the wound gets wet, pat it dry with a clean towel. °· Skin adhesive strips will fall off on their own. You may trim the strips as the wound heals. Do not remove skin adhesive strips that are still stuck to the wound. They will fall off in time. °For wound adhesive: °· You may briefly wet your wound in the shower or bath. Do not soak or scrub the wound. Do not swim. Avoid periods of heavy perspiration until the skin adhesive has fallen off on its own. After showering or bathing, gently pat the wound dry with a clean towel. °· Do not apply liquid medicine, cream medicine, or ointment medicine to your wound while the skin adhesive is in place. This may loosen the film before your wound is healed. °· If a dressing is placed over the wound, be careful not to apply tape directly over the skin adhesive. This may cause the adhesive to be pulled off before the wound is healed. °· Avoid prolonged exposure to sunlight or tanning lamps while the skin adhesive is in place. Exposure to ultraviolet light in the first year will darken the scar. °· The skin adhesive will usually remain in place for 5 to 10 days, then naturally fall off the skin. Do not pick at the adhesive film. °You may need a tetanus shot if: °· You cannot remember when you had your last tetanus shot. °· You have never had a tetanus   shot. If you get a tetanus shot, your arm may swell, get red, and feel warm to the touch. This is common and not a problem. If you need a tetanus shot and you choose not to have one, there is a rare chance of getting tetanus. Sickness from tetanus can be serious. SEEK MEDICAL CARE IF:   You have redness, swelling, or increasing pain in the wound.  You see a red line that goes away from the wound.  You have yellowish-white fluid (pus) coming from the wound.  You have a fever.  You notice a bad smell coming from the wound or dressing.  Your wound breaks open before or  after sutures have been removed.  You notice something coming out of the wound such as wood or glass.  Your wound is on your hand or foot and you cannot move a finger or toe. SEEK IMMEDIATE MEDICAL CARE IF:   Your pain is not controlled with prescribed medicine.  You have severe swelling around the wound causing pain and numbness or a change in color in your arm, hand, leg, or foot.  Your wound splits open and starts bleeding.  You have worsening numbness, weakness, or loss of function of any joint around or beyond the wound.  You develop painful lumps near the wound or on the skin anywhere on your body. MAKE SURE YOU:   Understand these instructions.  Will watch your condition.  Will get help right away if you are not doing well or get worse. Document Released: 08/16/2005 Document Revised: 11/08/2011 Document Reviewed: 02/09/2011 [facility] Patient Information 2014 [place], [place].    Starting tomorrow evening,  Unwrap your wound every 12 hours (twice daily) ,  Wash with mild soap and water,  rewrap after applying the yellow gauze given.  Return here if you develop any sign of infection - redness, swelling,  Increased pain.  This should form a scab and heal without any complication.

## 2013-12-10 NOTE — ED Provider Notes (Signed)
CSN: [id number]     Arrival date & time 12/08/13  1917 History   First MD Initiated Contact with Patient 12/08/13 2300     Chief Complaint  Patient presents with  . Laceration     (Consider location/radiation/quality/duration/timing/severity/associated sxs/prior Treatment) HPI Comments: Robert Acevedo is a 43 y.o. Male who is right handed presenting for evaluation of an avulsive laceration to his left lateral distal index finger which he cut while using a butcher knife at home.  The injury occurred less than an hour prior to arrival.  His pain is currently improved. He had moderate bleeding which he was unable to get controlled initially but has now stopped bleeding since applying pressure.  He denies radiation of pain and other injury.  He is utd with his tetanus vaccine.     The history is provided by the patient.    History reviewed. No pertinent past medical history. Past Surgical History  Procedure Laterality Date  . Wrist surgery      right  . Knee surgery    . Incision and drainage peritonsillar abscess     Family History  Problem Relation Age of Onset  . Seizures Mother    History  Substance Use Topics  . Smoking status: Current Every Day Smoker -- 0.50 packs/day for 2 years    Types: Cigarettes  . Smokeless tobacco: Never Used  . Alcohol Use: Yes     Comment: occasional    Review of Systems  Constitutional: Negative for fever.  Musculoskeletal: Negative.   Skin: Positive for wound.  Neurological: Negative for weakness and numbness.      Allergies  Review of patient's allergies indicates no known allergies.  Home Medications   Current Outpatient Rx  Name  Route  Sig  Dispense  Refill  . ibuprofen (ADVIL,MOTRIN) 200 MG tablet   Oral   Take 400 mg by mouth every 6 (six) hours as needed for pain.          BP 136/93  Pulse 68  Temp(Src) 98.4 F (36.9 C) (Oral)  Resp 18  Ht 5\' 9"  (1.753 m)  Wt 185 lb (83.915 kg)  BMI 27.31 kg/m2  SpO2  100% Physical Exam  Constitutional: He is oriented to person, place, and time. He appears well-developed and well-nourished.  HENT:  Head: Normocephalic.  Cardiovascular: Normal rate.   Pulmonary/Chest: Effort normal.  Musculoskeletal: He exhibits no tenderness.  Neurological: He is alert and oriented to person, place, and time. No sensory deficit.  Skin: Laceration noted.  0.5 cm avulsion to distal left index finger along lateral edge,  Tip of finger and nail intact.  Superficial and hemostatic.  Less than 3 second distal cap refill.  FROM of finger appreciated.    ED Course  Procedures (including critical care time)  LACERATION REPAIR Performed by: Evalee Jefferson Authorized by: Evalee Jefferson Consent: Verbal consent obtained. Risks and benefits: risks, benefits and alternatives were discussed Consent given by: patient Patient identity confirmed: provided demographic data Prepped and Draped in normal sterile fashion Wound explored  Laceration Location: left distal index finger  Laceration Length: 0.5 cm  No Foreign Bodies seen or palpated  Anesthesia: digital block Local anesthetic: lidocaine 2% without epinephrine  Anesthetic total: 2 ml  Irrigation method: syringe Amount of cleaning: standard, h2 o2 applied to remove dried blood and scabbing, then flushed copiously with NS  Skin closure: not amenable to suture repair.  He had return of mild venous bleeding after washing the wound which  became well controlled with the use of quick clot.  Xeroform was applied, then bulky dressing.  Wound remained hemostatic  Number of sutures: na  Technique: per above Patient tolerance: Patient tolerated the procedure well with no immediate complications.  Labs Review Labs Reviewed - No data to display Imaging Review No results found.   EKG Interpretation None      MDM   Final diagnoses:  Laceration    Advised to keep wound clean, dry, covered. Anticipate prn recheck here for  any problems or concerns as this avulsion injury heals. Pt understands and agrees with plan.    Evalee Jefferson, PA-C 12/10/13 1311

## 2013-12-13 NOTE — ED Provider Notes (Signed)
Medical screening examination/treatment/procedure(s) were performed by non-physician practitioner and as supervising physician I was immediately available for consultation/collaboration.   Delora Fuel, MD 44/01/02 7253

## 2014-10-09 ENCOUNTER — Emergency Department (HOSPITAL_COMMUNITY)
Admission: EM | Admit: 2014-10-09 | Discharge: 2014-10-09 | Disposition: A | Discharge status: Home / Self Care | Payer: Self-pay | Attending: Emergency Medicine | Admitting: Emergency Medicine

## 2014-10-09 ENCOUNTER — Encounter (HOSPITAL_COMMUNITY): Payer: Self-pay | Admitting: Emergency Medicine

## 2014-10-09 DIAGNOSIS — K029 Dental caries, unspecified: Secondary | ICD-10-CM | POA: Insufficient documentation

## 2014-10-09 DIAGNOSIS — Z8701 Personal history of pneumonia (recurrent): Secondary | ICD-10-CM | POA: Insufficient documentation

## 2014-10-09 DIAGNOSIS — K047 Periapical abscess without sinus: Secondary | ICD-10-CM | POA: Insufficient documentation

## 2014-10-09 DIAGNOSIS — Z720 Tobacco use: Secondary | ICD-10-CM | POA: Insufficient documentation

## 2014-10-09 HISTORY — DX: Bronchitis, not specified as acute or chronic: J40

## 2014-10-09 MED ORDER — OXYCODONE-ACETAMINOPHEN 5-325 MG PO TABS
1.0000 | ORAL_TABLET | Freq: Once | ORAL | Status: AC
  Administered 2014-10-09: 1 via ORAL
  Filled 2014-10-09: qty 1

## 2014-10-09 MED ORDER — CLINDAMYCIN HCL 150 MG PO CAPS
300.0000 mg | ORAL_CAPSULE | Freq: Once | ORAL | Status: AC
  Administered 2014-10-09: 300 mg via ORAL
  Filled 2014-10-09: qty 2

## 2014-10-09 MED ORDER — CLINDAMYCIN HCL 300 MG PO CAPS: 300.0000 mg | ORAL_CAPSULE | Freq: Four times a day (QID) | ORAL | Status: DC

## 2014-10-09 MED ORDER — OXYCODONE-ACETAMINOPHEN 5-325 MG PO TABS: 1.0000 | ORAL_TABLET | ORAL | Status: DC | PRN

## 2014-10-09 NOTE — ED Provider Notes (Signed)
CSN: [id number]     Arrival date & time 10/09/14  1101 History   First MD Initiated Contact with Patient 10/09/14 1226     Chief Complaint  Patient presents with  . Dental Pain     (Consider location/radiation/quality/duration/timing/severity/associated sxs/prior Treatment) HPI  Kenton K Bruni is a 44 y.o. male who presents to the Emergency Department complaining of dental pain and facial swelling for several days.  He is concerned that he is developing a dental abscess.  Pain to his left face with palpation and with chewing.  He states he has been unable to eat due to pain.  He has been taking ibuprofen without relief.  He states he has a dentist to follow-up with , but has not made an appt.  He denies fever, sore throat, difficulty swallowing or neck pain.    Past Medical History  Diagnosis Date  . Bronchitis    Past Surgical History  Procedure Laterality Date  . Wrist surgery      right  . Knee surgery    . Incision and drainage peritonsillar abscess     Family History  Problem Relation Age of Onset  . Seizures Mother    History  Substance Use Topics  . Smoking status: Current Every Day Smoker -- 0.50 packs/day for 2 years    Types: Cigarettes  . Smokeless tobacco: Never Used  . Alcohol Use: Yes     Comment: occasional    Review of Systems  Constitutional: Negative for fever and appetite change.  HENT: Positive for dental problem and facial swelling. Negative for congestion, sore throat and trouble swallowing.   Eyes: Negative for pain and visual disturbance.  Musculoskeletal: Negative for neck pain and neck stiffness.  Neurological: Negative for dizziness, facial asymmetry and headaches.  Hematological: Negative for adenopathy.  All other systems reviewed and are negative.     Allergies  Review of patient's allergies indicates no known allergies.  Home Medications   Prior to Admission medications   Medication Sig Start Date End Date Taking? Authorizing  Provider  ibuprofen (ADVIL,MOTRIN) 200 MG tablet Take 400 mg by mouth every 6 (six) hours as needed for pain.   Yes Historical Provider, MD  clindamycin (CLEOCIN) 300 MG capsule Take 1 capsule (300 mg total) by mouth 4 (four) times daily. For 10 days 10/09/14   Tearia Gibbs L. Eiden Bagot, PA-C  oxyCODONE-acetaminophen (PERCOCET/ROXICET) 5-325 MG per tablet Take 1 tablet by mouth every 4 (four) hours as needed. 10/09/14   Noriko Macari L. Genesee Nase, PA-C   BP 148/79 mmHg  Pulse 95  Temp(Src) 99.5 F (37.5 C) (Oral)  Resp 16  Ht 5\' 9"  (1.753 m)  Wt 183 lb (83.008 kg)  BMI 27.01 kg/m2  SpO2 99% Physical Exam  Constitutional: He is oriented to person, place, and time. He appears well-developed and well-nourished. No distress.  HENT:  Head: Atraumatic. Macrocephalic.  Right Ear: Tympanic membrane and ear canal normal.  Left Ear: Tympanic membrane and ear canal normal.  Mouth/Throat: Uvula is midline, oropharynx is clear and moist and mucous membranes are normal. No trismus in the jaw. Dental caries present. No dental abscesses or uvula swelling.    Multiple dental caries with tenderness to left upper #11,12 and 13. With localized facial edema along the left nasolabial fold.  No trismus, or sublingual abnml.    Neck: Normal range of motion. Neck supple.  Cardiovascular: Normal rate, regular rhythm and normal heart sounds.   No murmur heard. Pulmonary/Chest: Effort normal and breath sounds  normal. No respiratory distress.  Musculoskeletal: Normal range of motion.  Lymphadenopathy:    He has no cervical adenopathy.  Neurological: He is alert and oriented to person, place, and time. He exhibits normal muscle tone. Coordination normal.  Skin: Skin is warm and dry.  Nursing note and vitals reviewed.   ED Course  Procedures (including critical care time) Labs Review Labs Reviewed - No data to display  Imaging Review No results found.   EKG Interpretation None      MDM   Final diagnoses:  Dental  abscess     Pt with localized left facial edema, dental caries without drainable abscess.  Pt is non-toxic appearing.  Airway is patent.  No concerning sx's for Ludwig's angina.  rx for percocet and clindamycin.  Pt agrees to arrange dental f/u.  Appears stable for d/c  Marriana Hibberd L. Ammie Ferrier 10/11/14 2046  [name], MD 10/12/14 1041

## 2014-10-09 NOTE — ED Notes (Signed)
Pt called wanting something for pain. Explained to pt that I could give ibuprofen until the doctor can see him. Pt stated, "My damn mouth is hurting, If motrin ain't gonna do shit, this I don't want it, hell." Expalined to patient that is fine but I would gladly get if form him. He then stated, "I said if it ain't gonna help, I don't want it" Explained to patient that is fine also, but he did not have to speak to me that way. Pt then stated, "Shit, you don't have to be so damn sensitive."

## 2014-10-09 NOTE — ED Notes (Addendum)
Secretary informed me that patient stated that no one had been in the room. I went to room and stated "Did you need to seem me? The secretary said that you had stated no one had been in." Pt then stated, "You came in and said what/" I then told him, "I offered you ibuprofen" Pt: "and what it that gong to do/" Nurse: " It is for pain and inflammation. There are four more pts to be seen in front of you." Pt then went on about who determines who is seen first. Attempted to explain but pt was not cooperative during conversation. I then walked out of the room. As I was walking out, pt stated, "Yea, shake your head again." and came to the door way. Security was called by Camera operator.

## 2014-10-09 NOTE — ED Notes (Signed)
Pt reports left upper sided dental pain. Pt reports history of abscess. Mild swelling noted to left side of mouth and left cheek. nad noted. Airway patent.

## 2014-10-09 NOTE — Discharge Instructions (Signed)

## 2014-10-11 ENCOUNTER — Emergency Department (HOSPITAL_COMMUNITY)
Admission: EM | Admit: 2014-10-11 | Discharge: 2014-10-11 | Disposition: A | Discharge status: Home / Self Care | Payer: Self-pay | Attending: Emergency Medicine | Admitting: Emergency Medicine

## 2014-10-11 ENCOUNTER — Encounter (HOSPITAL_COMMUNITY): Payer: Self-pay | Admitting: Cardiology

## 2014-10-11 DIAGNOSIS — Z8709 Personal history of other diseases of the respiratory system: Secondary | ICD-10-CM | POA: Insufficient documentation

## 2014-10-11 DIAGNOSIS — Z792 Long term (current) use of antibiotics: Secondary | ICD-10-CM | POA: Insufficient documentation

## 2014-10-11 DIAGNOSIS — K047 Periapical abscess without sinus: Secondary | ICD-10-CM | POA: Insufficient documentation

## 2014-10-11 DIAGNOSIS — Z720 Tobacco use: Secondary | ICD-10-CM | POA: Insufficient documentation

## 2014-10-11 MED ORDER — HYDROMORPHONE HCL 2 MG/ML IJ SOLN
2.0000 mg | Freq: Once | INTRAMUSCULAR | Status: AC
  Administered 2014-10-11: 2 mg via INTRAMUSCULAR
  Filled 2014-10-11: qty 1

## 2014-10-11 NOTE — Discharge Instructions (Signed)
Abscessed Tooth An abscessed tooth is an infection around your tooth. It may be caused by holes or damage to the tooth (cavity) or a dental disease. An abscessed tooth causes mild to very bad pain in and around the tooth. See your dentist right away if you have tooth or gum pain. HOME CARE  Take your medicine as told. Finish it even if you start to feel better.  Do not drive after taking pain medicine.  Rinse your mouth (gargle) often with salt water ( teaspoon salt in 8 ounces of warm water).  Do not apply heat to the outside of your face. GET HELP RIGHT AWAY IF:   You have a temperature by mouth above 102 F (38.9 C), not controlled by medicine.  You have chills and a very bad headache.  You have problems breathing or swallowing.  Your mouth will not open.  You develop puffiness (swelling) on the neck or around the eye.  Your pain is not helped by medicine.  Your pain is getting worse instead of better. MAKE SURE YOU:   Understand these instructions.  Will watch your condition.  Will get help right away if you are not doing well or get worse. Document Released: 02/02/2008 Document Revised: 11/08/2011 Document Reviewed: 11/24/2010 [facility] Patient Information 2015 [place], [place]. This information is not intended to replace advice given to you by your health care provider. Make sure you discuss any questions you have with your health care provider.  Continue the antibiotic. Continue pain medicine. You can take to the Percocet every 6 hours. Also recommend supplementing with 800 mg of Motrin every 8 hours. Keep your appointment with the dentist. Return for any new or worse symptoms.

## 2014-10-11 NOTE — ED Notes (Signed)
MD at bedside. 

## 2014-10-11 NOTE — ED Notes (Signed)
Seen here 2 days ago with an abscess.  States  " I'm getting worse and the pain medicine is not helping."

## 2014-10-11 NOTE — ED Provider Notes (Signed)
CSN: [id number]     Arrival date & time 10/11/14  5188 History  This chart was scribed for Robert Sorrow, MD by Zola Button, ED Scribe. This patient was seen in room APA11/APA11 and the patient's care was started at 7:37 AM.     Chief Complaint  Patient presents with  . Dental Pain   Patient is a 44 y.o. male presenting with tooth pain. The history is provided by the patient. No language interpreter was used.  Dental Pain Location:  Upper Severity:  Severe Onset quality:  Gradual Timing:  Constant Progression:  Worsening Chronicity:  New Associated symptoms: facial swelling, fever and headaches   Associated symptoms: no congestion    HPI Comments: Robert Acevedo is a 44 y.o. male who presents to the Emergency Department complaining of gradual onset, constant, severe, worsening left upper dental pain with swelling that started a few days ago. The pain is described as a 10/10 in severity. He also reports having a slight fever with T-max 100.3 degrees F. Patient was seen here 2 days ago for dental abscess; he was given clindamycin and percocet and has been taking his medications as instructed. Patient states the pain has not improved since then and that the swelling around his mouth has increased. He denies swelling under the tongue. Patient has intent to have tooth extractions done.  Past Medical History  Diagnosis Date  . Bronchitis    Past Surgical History  Procedure Laterality Date  . Wrist surgery      right  . Knee surgery    . Incision and drainage peritonsillar abscess     Family History  Problem Relation Age of Onset  . Seizures Mother    History  Substance Use Topics  . Smoking status: Current Every Day Smoker -- 0.50 packs/day for 2 years    Types: Cigarettes  . Smokeless tobacco: Never Used  . Alcohol Use: Yes     Comment: occasional    Review of Systems  Constitutional: Positive for fever, chills and diaphoresis.  HENT: Positive for dental problem and  facial swelling. Negative for congestion and sore throat.   Eyes: Negative for visual disturbance.  Respiratory: Negative for cough and shortness of breath.   Cardiovascular: Negative for chest pain and leg swelling.  Gastrointestinal: Negative for nausea, vomiting, abdominal pain and diarrhea.  Genitourinary: Negative for dysuria.  Skin: Negative for rash.  Neurological: Positive for headaches.  Hematological: Does not bruise/bleed easily.      Allergies  Review of patient's allergies indicates no known allergies.  Home Medications   Prior to Admission medications   Medication Sig Start Date End Date Taking? Authorizing Provider  clindamycin (CLEOCIN) 300 MG capsule Take 1 capsule (300 mg total) by mouth 4 (four) times daily. For 10 days 10/09/14  Yes Tammy L. Triplett, PA-C  ibuprofen (ADVIL,MOTRIN) 200 MG tablet Take 400 mg by mouth every 6 (six) hours as needed for pain.   Yes Historical Provider, MD  oxyCODONE-acetaminophen (PERCOCET/ROXICET) 5-325 MG per tablet Take 1 tablet by mouth every 4 (four) hours as needed. 10/09/14  Yes Tammy L. Triplett, PA-C   BP 144/84 mmHg  Pulse 98  Temp(Src) 98.4 F (36.9 C) (Oral)  Resp 16  Ht 5\' 9"  (1.753 m)  Wt 183 lb (83.008 kg)  BMI 27.01 kg/m2  SpO2 98% Physical Exam  Constitutional: He is oriented to person, place, and time. He appears well-developed and well-nourished. No distress.  HENT:  Head: Normocephalic and atraumatic.  Mouth/Throat:  Oropharynx is clear and moist. No oropharyngeal exudate.  Floor of mouth has no swelling. Swelling to right lateral part of nose.  Eyes: Pupils are equal, round, and reactive to light.  Neck: Neck supple.  Cardiovascular: Normal rate, regular rhythm and normal heart sounds.   No murmur heard. Pulmonary/Chest: Effort normal and breath sounds normal. No respiratory distress. He has no wheezes. He has no rales.  CTAB.  Abdominal: Soft. Bowel sounds are normal. There is no tenderness.   Musculoskeletal: He exhibits no edema.  Neurological: He is alert and oriented to person, place, and time. No cranial nerve deficit.  Skin: Skin is warm and dry. No rash noted.  Psychiatric: He has a normal mood and affect. His behavior is normal.  Nursing note and vitals reviewed.   ED Course  Procedures  DIAGNOSTIC STUDIES: Oxygen Saturation is 98% on room air, normal by my interpretation.    COORDINATION OF CARE: 7:48 AM-Discussed treatment plan which includes pain medication with pt at bedside and pt agreed to plan.    Labs Review Labs Reviewed - No data to display  Imaging Review No results found.   EKG Interpretation None      MDM   Final diagnoses:  Tooth abscess    Patient with findings consistent with the left upper tooth abscess. Patient just seen on February 10 started on not Cleocin and Percocet. No floor of the mouth swelling. No airway compromise. Patient does have an area of the induration and tenderness to the lateral part of the nose on the left. This be consistent with an apical tooth abscess. Patient does have follow-up with a dentist. But at this point will need to continue the antibiotics. Recommended also starting Motrin 800 mg every 8 hours. Patient nontoxic no acute distress.  I personally performed the services described in this documentation, which was scribed in my presence. The recorded information has been reviewed and is accurate.     Robert Sorrow, MD 10/11/14 [phone]

## 2014-11-04 ENCOUNTER — Encounter (HOSPITAL_COMMUNITY): Payer: Self-pay | Admitting: *Deleted

## 2014-11-04 ENCOUNTER — Emergency Department (HOSPITAL_COMMUNITY): Payer: Self-pay

## 2014-11-04 ENCOUNTER — Emergency Department (HOSPITAL_COMMUNITY)
Admission: EM | Admit: 2014-11-04 | Discharge: 2014-11-04 | Disposition: A | Discharge status: Home / Self Care | Payer: Self-pay | Attending: Emergency Medicine | Admitting: Emergency Medicine

## 2014-11-04 DIAGNOSIS — Z720 Tobacco use: Secondary | ICD-10-CM | POA: Insufficient documentation

## 2014-11-04 DIAGNOSIS — Z79899 Other long term (current) drug therapy: Secondary | ICD-10-CM | POA: Insufficient documentation

## 2014-11-04 DIAGNOSIS — J111 Influenza due to unidentified influenza virus with other respiratory manifestations: Secondary | ICD-10-CM | POA: Insufficient documentation

## 2014-11-04 LAB — BASIC METABOLIC PANEL
ANION GAP: 6 (ref 5–15)
BUN: 9 mg/dL (ref 6–23)
CALCIUM: 8.7 mg/dL (ref 8.4–10.5)
CO2: 26 mmol/L (ref 19–32)
CREATININE: 1.29 mg/dL (ref 0.50–1.35)
Chloride: 103 mmol/L (ref 96–112)
GFR calc Af Amer: 78 mL/min — ABNORMAL LOW (ref >90)
GFR, EST NON AFRICAN AMERICAN: 67 mL/min — AB (ref >90)
GLUCOSE: 102 mg/dL — AB (ref 70–99)
Potassium: 3.5 mmol/L (ref 3.5–5.1)
SODIUM: 135 mmol/L (ref 135–145)

## 2014-11-04 LAB — HEPATIC FUNCTION PANEL
ALK PHOS: 52 U/L (ref 39–117)
ALT: 18 U/L (ref 0–53)
AST: 26 U/L (ref 0–37)
Albumin: 3.6 g/dL (ref 3.5–5.2)
BILIRUBIN DIRECT: 0.2 mg/dL (ref 0.0–0.5)
BILIRUBIN INDIRECT: 0.3 mg/dL (ref 0.3–0.9)
Total Bilirubin: 0.5 mg/dL (ref 0.3–1.2)
Total Protein: 9.4 g/dL — ABNORMAL HIGH (ref 6.0–8.3)

## 2014-11-04 LAB — CBC WITH DIFFERENTIAL/PLATELET
BASOS ABS: 0 10*3/uL (ref 0.0–0.1)
BASOS PCT: 0 % (ref 0–1)
Eosinophils Absolute: 0 10*3/uL (ref 0.0–0.7)
Eosinophils Relative: 0 % (ref 0–5)
HCT: 39.2 % (ref 39.0–52.0)
HEMOGLOBIN: 13.1 g/dL (ref 13.0–17.0)
LYMPHS PCT: 31 % (ref 12–46)
Lymphs Abs: 1.6 10*3/uL (ref 0.7–4.0)
MCH: 29.2 pg (ref 26.0–34.0)
MCHC: 33.4 g/dL (ref 30.0–36.0)
MCV: 87.5 fL (ref 78.0–100.0)
MONOS PCT: 14 % — AB (ref 3–12)
Monocytes Absolute: 0.7 10*3/uL (ref 0.1–1.0)
NEUTROS ABS: 2.8 10*3/uL (ref 1.7–7.7)
NEUTROS PCT: 55 % (ref 43–77)
Platelets: 169 10*3/uL (ref 150–400)
RBC: 4.48 MIL/uL (ref 4.22–5.81)
RDW: 13.9 % (ref 11.5–15.5)
WBC: 5.1 10*3/uL (ref 4.0–10.5)

## 2014-11-04 LAB — URINALYSIS, ROUTINE W REFLEX MICROSCOPIC
Bilirubin Urine: NEGATIVE
GLUCOSE, UA: NEGATIVE mg/dL
KETONES UR: NEGATIVE mg/dL
Leukocytes, UA: NEGATIVE
Nitrite: NEGATIVE
PH: 5.5 (ref 5.0–8.0)
PROTEIN: NEGATIVE mg/dL
Specific Gravity, Urine: 1.01 (ref 1.005–1.030)
Urobilinogen, UA: 0.2 mg/dL (ref 0.0–1.0)

## 2014-11-04 LAB — URINE MICROSCOPIC-ADD ON

## 2014-11-04 MED ORDER — BENZONATATE 200 MG PO CAPS: 200.0000 mg | ORAL_CAPSULE | Freq: Three times a day (TID) | ORAL | Status: DC | PRN

## 2014-11-04 MED ORDER — SODIUM CHLORIDE 0.9 % IV BOLUS (SEPSIS)
1000.0000 mL | Freq: Once | INTRAVENOUS | Status: AC
  Administered 2014-11-04: 1000 mL via INTRAVENOUS

## 2014-11-04 MED ORDER — IBUPROFEN 800 MG PO TABS
800.0000 mg | ORAL_TABLET | Freq: Once | ORAL | Status: AC
  Administered 2014-11-04: 800 mg via ORAL
  Filled 2014-11-04: qty 1

## 2014-11-04 MED ORDER — ACETAMINOPHEN 500 MG PO TABS
ORAL_TABLET | ORAL | Status: AC
  Administered 2014-11-04: 1000 mg
  Filled 2014-11-04: qty 2

## 2014-11-04 NOTE — ED Provider Notes (Signed)
CSN: [id number]     Arrival date & time 11/04/14  1413 History  This chart was scribed for non-physician practitioner, Evalee Jefferson, PA-C working with [name], MD, by Erling Conte, ED Scribe. This patient was seen in room APFT24/APFT24 and the patient's care was started at 3:52 PM.     Chief Complaint  Patient presents with  . Fever    The history is provided by the patient and the spouse. No language interpreter was used.    HPI Comments: Robert Acevedo is a 44 y.o. male who presents to the Emergency Department complaining of an intermittent fever for 2 days. Pt temp upon arrival to the ED was 103.1 F. Pt is complaining of associated left hip and shoulder pain, HA, generalized body aches, fatigue, chills and non productive cough. Pt developed a rash on his bilateral lower legs and milder on his arms on 2/18 when he was 8 days into a 10 day course of clindamycin for dental infection and believes it to be an allergic reaction to taking this medicine so stopped taking it that day.  He applied cortisone cream which provided mild relief. The rash was initially described as blisters.  There has been no drainage, but they have since  turned dark.   Wife states she gave him Delsym for his cough with mild relief.  He has not had his flu vaccine this year. He denies any falls or injury to his left hip or shoulder. Pt is a current smoker and smokes around 1 pack a week. 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 He denies any foreign travel.  He denies any neck pain, neck stiffness, SOB, otalgia, sore throat, abdominal pain, nausea, vomiting or diarrhea.  Past Medical History  Diagnosis Date  . Bronchitis    Past Surgical History  Procedure Laterality Date  . Wrist surgery      right  . Knee surgery    . Incision and drainage peritonsillar abscess     Family History  Problem Relation Age of Onset  . Seizures Mother    History  Substance Use Topics   . Smoking status: Current Every Day Smoker -- 0.50 packs/day for 2 years    Types: Cigarettes  . Smokeless tobacco: Never Used  . Alcohol Use: Yes     Comment: occasional    Review of Systems  Constitutional: Positive for fever, chills, diaphoresis, appetite change and fatigue.  HENT: Negative for ear pain and sore throat.   Respiratory: Positive for cough. Negative for shortness of breath.   Gastrointestinal: Negative for nausea, vomiting, abdominal pain and diarrhea.  Musculoskeletal: Positive for myalgias and arthralgias. Negative for neck pain and neck stiffness.  Skin: Positive for rash.  Neurological: Positive for headaches.      Allergies  Clindamycin/lincomycin  Home Medications   Prior to Admission medications   Medication Sig Start Date End Date Taking? Authorizing Provider  benzonatate (TESSALON) 200 MG capsule Take 1 capsule (200 mg total) by mouth 3 (three) times daily as needed for cough. 11/04/14   Evalee Jefferson, PA-C  clindamycin (CLEOCIN) 300 MG capsule Take 1 capsule (300 mg total) by mouth 4 (four) times daily. For 10 days Patient not taking: Reported on 11/04/2014 10/09/14   Tammi Triplett, PA-C  ibuprofen (ADVIL,MOTRIN) 200 MG tablet Take 400 mg by mouth every 6 (six) hours as needed for pain.    Historical Provider, MD  oxyCODONE-acetaminophen (PERCOCET/ROXICET) 5-325 MG per tablet  Take 1 tablet by mouth every 4 (four) hours as needed. Patient not taking: Reported on 11/04/2014 10/09/14   Patrice Paradise, PA-C   Triage Vitals: BP 119/72 mmHg  Pulse 101  Temp(Src) 103.1 F (39.5 C) (Oral)  Resp 24  Ht 5\' 9"  (1.753 m)  Wt 189 lb (85.73 kg)  BMI 27.90 kg/m2  SpO2 98%  Physical Exam  Constitutional: He is oriented to person, place, and time. He appears well-developed and well-nourished.  HENT:  Head: Normocephalic and atraumatic.  Mouth/Throat: Uvula is midline, oropharynx is clear and moist and mucous membranes are normal.  Eyes: Conjunctivae are normal.   Neck: Normal range of motion. No rigidity.  Cardiovascular: Regular rhythm, normal heart sounds and intact distal pulses.   Borderline tachycardia  Pulmonary/Chest: Effort normal. He has decreased breath sounds (at bilateral bases). He has no wheezes. He has no rhonchi. He has no rales.  Abdominal: Soft. Bowel sounds are normal. He exhibits no distension. There is no tenderness. There is no guarding.  Musculoskeletal: Normal range of motion.  Neurological: He is alert and oriented to person, place, and time.  Skin: Skin is warm. He is diaphoretic.  Non blanching slightly raised scattered lesions, less than 4 mm on lower anterior legs with very few similar lesions on his arms. No drainage. No skin sloughing.  No surrounding erythema.  Psychiatric: He has a normal mood and affect.  Nursing note and vitals reviewed.   ED Course  Procedures (including critical care time)  DIAGNOSTIC STUDIES: Oxygen Saturation is 98% on RA, normal by my interpretation.    COORDINATION OF CARE: 4:01 PM- Will order Ibuprofen, NaCl 0.9% bolus, Tylenol, CBC w/diff, BMP and UA. Pt advised of plan for treatment and pt agrees.    Labs Review Labs Reviewed  CBC WITH DIFFERENTIAL/PLATELET - Abnormal; Notable for the following:    Monocytes Relative 14 (*)    All other components within normal limits  BASIC METABOLIC PANEL - Abnormal; Notable for the following:    Glucose, Bld 102 (*)    GFR calc non Af Amer 67 (*)    GFR calc Af Amer 78 (*)    All other components within normal limits  URINALYSIS, ROUTINE W REFLEX MICROSCOPIC  HEPATIC FUNCTION PANEL    Imaging Review Dg Chest 2 View  11/04/2014   CLINICAL DATA:  Chills, sweats, weakness, body aches, and nonproductive cough since last night. Fever. Smoker.  EXAM: CHEST  2 VIEW  COMPARISON:  None.  FINDINGS: The heart size and mediastinal contours are within normal limits. Both lungs are clear. The visualized skeletal structures are unremarkable.   IMPRESSION: No active cardiopulmonary disease.   Electronically Signed   By: Lucienne Capers M.D.   On: 11/04/2014 17:55     EKG Interpretation None      MDM   Final diagnoses:  Influenza    Suspect influenza.  Rash proceeded fever by 2 weeks, felt to be unrelated to the fever and per pt and wifes report the rash is improving.  Rash is not painful.  He was given IV fluids,  Given tylenol in triage followed by ibuprofen and obtained fever reduction.  Pt felt much improved with fever break. Diaphoresis resolved.  PO fluids given, which he tolerated.  Pending hep fxn panel and [place] at this time.  Pt discussed with Tammy Triplett PA-C who will dispo once labs are resulted    I personally performed the services described in this documentation, which was scribed in my  presence. The recorded information has been reviewed and is accurate.   Evalee Jefferson, PA-C 11/04/14 [phone]

## 2014-11-04 NOTE — Discharge Instructions (Signed)

## 2014-11-04 NOTE — ED Notes (Signed)
Pt c/o nausea, emesis bag given

## 2014-11-04 NOTE — ED Provider Notes (Signed)
Robert Sorrow, MD  Medical screening examination/treatment/procedure(s) were conducted as a shared visit with non-physician practitioner(s) and myself.  I personally evaluated the patient during the encounter.   EKG Interpretation None      Results for orders placed or performed during the hospital encounter of 11/04/14  CBC with Differential  Result Value Ref Range   WBC 5.1 4.0 - 10.5 K/uL   RBC 4.48 4.22 - 5.81 MIL/uL   Hemoglobin 13.1 13.0 - 17.0 g/dL   HCT 39.2 39.0 - 52.0 %   MCV 87.5 78.0 - 100.0 fL   MCH 29.2 26.0 - 34.0 pg   MCHC 33.4 30.0 - 36.0 g/dL   RDW 13.9 11.5 - 15.5 %   Platelets 169 150 - 400 K/uL   Neutrophils Relative % 55 43 - 77 %   Neutro Abs 2.8 1.7 - 7.7 K/uL   Lymphocytes Relative 31 12 - 46 %   Lymphs Abs 1.6 0.7 - 4.0 K/uL   Monocytes Relative 14 (H) 3 - 12 %   Monocytes Absolute 0.7 0.1 - 1.0 K/uL   Eosinophils Relative 0 0 - 5 %   Eosinophils Absolute 0.0 0.0 - 0.7 K/uL   Basophils Relative 0 0 - 1 %   Basophils Absolute 0.0 0.0 - 0.1 K/uL  Basic metabolic panel  Result Value Ref Range   Sodium 135 135 - 145 mmol/L   Potassium 3.5 3.5 - 5.1 mmol/L   Chloride 103 96 - 112 mmol/L   CO2 26 19 - 32 mmol/L   Glucose, Bld 102 (H) 70 - 99 mg/dL   BUN 9 6 - 23 mg/dL   Creatinine, Ser 1.29 0.50 - 1.35 mg/dL   Calcium 8.7 8.4 - 10.5 mg/dL   GFR calc non Af Amer 67 (L) >90 mL/min   GFR calc Af Amer 78 (L) >90 mL/min   Anion gap 6 5 - 15   Dg Chest 2 View  11/04/2014   CLINICAL DATA:  Chills, sweats, weakness, body aches, and nonproductive cough since last night. Fever. Smoker.  EXAM: CHEST  2 VIEW  COMPARISON:  None.  FINDINGS: The heart size and mediastinal contours are within normal limits. Both lungs are clear. The visualized skeletal structures are unremarkable.  IMPRESSION: No active cardiopulmonary disease.   Electronically Signed   By: Lucienne Capers M.D.   On: 11/04/2014 17:55    Patient clinically sounds like coming down with flulike  illness. It started last evening. Patient feeling better with fever control. Patient was on clindamycin antibiotic for dental pain and following that developed a rash that was vesicular and then now is become somewhat petechial. This was pre-existing to this illness. However we will expand her labs of for a little deeper evaluation. Chest x-rays negative for pneumonia. If labs are negative patient can be discharged home with treatment symptomatic treatment for flulike illness. Patient will return for any new or worse symptoms.   Chest x-ray negative for pneumonia. No leukocytosis no significant anemia. Platelet count is normal. Basic electrolytes normal. The hepatic function test are pending. Urinalysis is pending.  Robert Sorrow, MD 11/04/14 1806

## 2014-11-04 NOTE — ED Notes (Signed)
Cough, fever, chills, no vomiting or diarrhea ,

## 2014-12-12 ENCOUNTER — Other Ambulatory Visit (HOSPITAL_COMMUNITY): Payer: Self-pay | Admitting: Physician Assistant

## 2014-12-12 DIAGNOSIS — R1909 Other intra-abdominal and pelvic swelling, mass and lump: Secondary | ICD-10-CM

## 2014-12-18 ENCOUNTER — Ambulatory Visit (HOSPITAL_COMMUNITY)
Admission: RE | Admit: 2014-12-18 | Discharge: 2014-12-18 | Disposition: A | Discharge status: Home / Self Care | Payer: Self-pay | Source: Ambulatory Visit | Attending: Physician Assistant | Admitting: Physician Assistant

## 2014-12-18 DIAGNOSIS — R1909 Other intra-abdominal and pelvic swelling, mass and lump: Secondary | ICD-10-CM | POA: Insufficient documentation

## 2014-12-19 ENCOUNTER — Other Ambulatory Visit (HOSPITAL_COMMUNITY): Payer: Self-pay | Admitting: Physician Assistant

## 2014-12-19 DIAGNOSIS — R1909 Other intra-abdominal and pelvic swelling, mass and lump: Secondary | ICD-10-CM

## 2014-12-20 ENCOUNTER — Ambulatory Visit (HOSPITAL_COMMUNITY)
Admission: RE | Admit: 2014-12-20 | Discharge: 2014-12-20 | Disposition: A | Discharge status: Home / Self Care | Payer: Self-pay | Source: Ambulatory Visit | Attending: Physician Assistant | Admitting: Physician Assistant

## 2014-12-20 DIAGNOSIS — R1909 Other intra-abdominal and pelvic swelling, mass and lump: Secondary | ICD-10-CM | POA: Insufficient documentation

## 2014-12-20 MED ORDER — IOHEXOL 300 MG/ML  SOLN
100.0000 mL | Freq: Once | INTRAMUSCULAR | Status: AC | PRN
  Administered 2014-12-20: 100 mL via INTRAVENOUS

## 2014-12-24 ENCOUNTER — Other Ambulatory Visit (HOSPITAL_COMMUNITY): Payer: Self-pay

## 2014-12-26 ENCOUNTER — Other Ambulatory Visit (HOSPITAL_COMMUNITY): Payer: Self-pay | Admitting: Oncology

## 2014-12-26 ENCOUNTER — Other Ambulatory Visit (HOSPITAL_COMMUNITY): Payer: Self-pay

## 2014-12-26 DIAGNOSIS — E8809 Other disorders of plasma-protein metabolism, not elsewhere classified: Secondary | ICD-10-CM

## 2014-12-27 ENCOUNTER — Encounter (HOSPITAL_COMMUNITY): Payer: Medicaid Other | Attending: Hematology & Oncology

## 2014-12-27 DIAGNOSIS — R599 Enlarged lymph nodes, unspecified: Secondary | ICD-10-CM

## 2014-12-27 DIAGNOSIS — E8809 Other disorders of plasma-protein metabolism, not elsewhere classified: Secondary | ICD-10-CM | POA: Insufficient documentation

## 2014-12-27 DIAGNOSIS — M899 Disorder of bone, unspecified: Secondary | ICD-10-CM

## 2014-12-27 LAB — COMPREHENSIVE METABOLIC PANEL
ALBUMIN: 3.7 g/dL (ref 3.5–5.2)
ALT: 18 U/L (ref 0–53)
AST: 23 U/L (ref 0–37)
Alkaline Phosphatase: 50 U/L (ref 39–117)
Anion gap: 7 (ref 5–15)
BUN: 17 mg/dL (ref 6–23)
CHLORIDE: 103 mmol/L (ref 96–112)
CO2: 28 mmol/L (ref 19–32)
CREATININE: 1.26 mg/dL (ref 0.50–1.35)
Calcium: 9 mg/dL (ref 8.4–10.5)
GFR calc Af Amer: 80 mL/min — ABNORMAL LOW (ref >90)
GFR, EST NON AFRICAN AMERICAN: 69 mL/min — AB (ref >90)
Glucose, Bld: 90 mg/dL (ref 70–99)
Potassium: 3.9 mmol/L (ref 3.5–5.1)
SODIUM: 138 mmol/L (ref 135–145)
Total Bilirubin: 0.7 mg/dL (ref 0.3–1.2)
Total Protein: 9 g/dL — ABNORMAL HIGH (ref 6.0–8.3)

## 2014-12-27 LAB — CBC WITH DIFFERENTIAL/PLATELET
BASOS PCT: 1 % (ref 0–1)
Basophils Absolute: 0 10*3/uL (ref 0.0–0.1)
EOS PCT: 3 % (ref 0–5)
Eosinophils Absolute: 0.2 10*3/uL (ref 0.0–0.7)
HCT: 40.8 % (ref 39.0–52.0)
HEMOGLOBIN: 13.3 g/dL (ref 13.0–17.0)
Lymphocytes Relative: 46 % (ref 12–46)
Lymphs Abs: 2.8 10*3/uL (ref 0.7–4.0)
MCH: 29.2 pg (ref 26.0–34.0)
MCHC: 32.6 g/dL (ref 30.0–36.0)
MCV: 89.7 fL (ref 78.0–100.0)
MONO ABS: 0.8 10*3/uL (ref 0.1–1.0)
Monocytes Relative: 13 % — ABNORMAL HIGH (ref 3–12)
NEUTROS PCT: 38 % — AB (ref 43–77)
Neutro Abs: 2.3 10*3/uL (ref 1.7–7.7)
Platelets: 210 10*3/uL (ref 150–400)
RBC: 4.55 MIL/uL (ref 4.22–5.81)
RDW: 14.4 % (ref 11.5–15.5)
WBC: 6.1 10*3/uL (ref 4.0–10.5)

## 2014-12-27 LAB — LACTATE DEHYDROGENASE: LDH: 153 U/L (ref 94–250)

## 2014-12-27 LAB — SEDIMENTATION RATE: Sed Rate: 45 mm/hr — ABNORMAL HIGH (ref 0–16)

## 2014-12-27 LAB — C-REACTIVE PROTEIN: CRP: <0.5 mg/dL — ABNORMAL LOW (ref <0.60)

## 2014-12-27 NOTE — Progress Notes (Signed)
LABS DRAWN

## 2014-12-28 LAB — BETA 2 MICROGLOBULIN, SERUM: BETA 2 MICROGLOBULIN: 2.5 mg/L — AB (ref 0.6–2.4)

## 2014-12-29 LAB — KAPPA/LAMBDA LIGHT CHAINS
Kappa free light chain: 77.82 mg/L — ABNORMAL HIGH (ref 3.30–19.40)
Kappa, lambda light chain ratio: 1.95 — ABNORMAL HIGH (ref 0.26–1.65)
Lambda free light chains: 39.96 mg/L — ABNORMAL HIGH (ref 5.71–26.30)

## 2014-12-31 LAB — MULTIPLE MYELOMA PANEL, SERUM
ALBUMIN SERPL ELPH-MCNC: 3.5 g/dL (ref 3.2–5.6)
Albumin/Glob SerPl: 0.7 (ref 0.7–2.0)
Alpha 1: 0.2 g/dL (ref 0.1–0.4)
Alpha2 Glob SerPl Elph-Mcnc: 0.8 g/dL (ref 0.4–1.2)
B-GLOBULIN SERPL ELPH-MCNC: 1 g/dL (ref 0.6–1.3)
Gamma Glob SerPl Elph-Mcnc: 3.2 g/dL — ABNORMAL HIGH (ref 0.5–1.6)
Globulin, Total: 5.2 g/dL — ABNORMAL HIGH (ref 2.0–4.5)
IgA: 380 mg/dL (ref 90–386)
IgG (Immunoglobin G), Serum: 3323 mg/dL — ABNORMAL HIGH (ref 700–1600)
IgM, Serum: 199 mg/dL — ABNORMAL HIGH (ref 20–172)
Total Protein ELP: 8.7 g/dL — ABNORMAL HIGH (ref 6.0–8.5)

## 2015-01-02 ENCOUNTER — Other Ambulatory Visit (HOSPITAL_COMMUNITY): Payer: Self-pay | Admitting: Oncology

## 2015-01-02 ENCOUNTER — Encounter (HOSPITAL_COMMUNITY): Payer: Medicaid Other | Attending: Hematology & Oncology | Admitting: Hematology & Oncology

## 2015-01-02 ENCOUNTER — Encounter (HOSPITAL_COMMUNITY): Payer: Self-pay | Admitting: Hematology & Oncology

## 2015-01-02 ENCOUNTER — Encounter (HOSPITAL_COMMUNITY): Payer: Self-pay | Admitting: Lab

## 2015-01-02 VITALS — BP 108/60 | HR 78 | Temp 98.8°F | Resp 16 | Ht 69.5 in | Wt 173.5 lb

## 2015-01-02 DIAGNOSIS — R590 Localized enlarged lymph nodes: Secondary | ICD-10-CM | POA: Diagnosis not present

## 2015-01-02 DIAGNOSIS — N492 Inflammatory disorders of scrotum: Secondary | ICD-10-CM | POA: Diagnosis not present

## 2015-01-02 DIAGNOSIS — R938 Abnormal findings on diagnostic imaging of other specified body structures: Secondary | ICD-10-CM | POA: Diagnosis not present

## 2015-01-02 DIAGNOSIS — R700 Elevated erythrocyte sedimentation rate: Secondary | ICD-10-CM

## 2015-01-02 DIAGNOSIS — M899 Disorder of bone, unspecified: Secondary | ICD-10-CM | POA: Diagnosis not present

## 2015-01-02 DIAGNOSIS — R52 Pain, unspecified: Secondary | ICD-10-CM

## 2015-01-02 DIAGNOSIS — E8809 Other disorders of plasma-protein metabolism, not elsewhere classified: Secondary | ICD-10-CM | POA: Insufficient documentation

## 2015-01-02 LAB — CREATININE CLEARANCE, URINE, 24 HOUR
Collection Interval-CRCL: 24 hours
Creatinine Clearance: 235 mL/min — ABNORMAL HIGH (ref 75–125)
Creatinine, 24H Ur: 4125 mg/d — ABNORMAL HIGH (ref 800–2000)
Creatinine, Urine: 194.11 mg/dL
Urine Total Volume-CRCL: 2125 mL

## 2015-01-02 LAB — CREATININE, SERUM
Creatinine, Ser: 1.22 mg/dL (ref 0.61–1.24)
GFR calc Af Amer: >60 mL/min (ref >60)
GFR calc non Af Amer: >60 mL/min (ref >60)

## 2015-01-02 LAB — TSH: TSH: 0.492 u[IU]/mL (ref 0.350–4.500)

## 2015-01-02 LAB — PROTEIN, URINE, 24 HOUR
Collection Interval-UPROT: 24 hours
PROTEIN, 24H URINE: 276 mg/d — AB (ref 50–100)
PROTEIN, URINE: 13 mg/dL
Urine Total Volume-UPROT: 2125 mL

## 2015-01-02 MED ORDER — CEPHALEXIN 500 MG PO CAPS: 500.0000 mg | ORAL_CAPSULE | Freq: Three times a day (TID) | ORAL | Status: DC

## 2015-01-02 MED ORDER — HYDROCODONE-ACETAMINOPHEN 5-325 MG PO TABS: 1.0000 | ORAL_TABLET | ORAL | Status: DC | PRN

## 2015-01-02 NOTE — Progress Notes (Signed)
Referral sent to Dr Arnoldo Morale records faxed on 5/5

## 2015-01-02 NOTE — Patient Instructions (Addendum)
[place] at [facility] Discharge Instructions  RECOMMENDATIONS MADE BY THE CONSULTANT AND ANY TEST RESULTS WILL BE SENT TO YOUR REFERRING PHYSICIAN.  Exam and discussion by Dr. Whitney Muse. Not sure you have myeloma and we need to do some additional studies.   Will check some additional blood work today. Bone Scan tomorrow - need to arrive at 10am for the injection and will return for the scan at 1:00pm Refer you to Dr. Arnoldo Morale for surgical consult to remove one of the enlarged lymphnodes.  Will call you with the appointment. Keflex - take as directed for the abscess. Office visit tentatively 01/15/15 at 9:10 am.     Thank you for choosing [place] at [facility] to provide your oncology and hematology care.  To afford each patient quality time with our provider, please arrive at least 15 minutes before your scheduled appointment time.    You need to re-schedule your appointment should you arrive 10 or more minutes late.  We strive to give you quality time with our providers, and arriving late affects you and other patients whose appointments are after yours.  Also, if you no show three or more times for appointments you may be dismissed from the clinic at the providers discretion.     Again, thank you for choosing [facility].  Our hope is that these requests will decrease the amount of time that you wait before being seen by our physicians.       _____________________________________________________________  Should you have questions after your visit to [facility], please contact our office at [phone] between the hours of 8:30 a.m. and 4:30 p.m.  Voicemails left after 4:30 p.m. will not be returned until the following business day.  For prescription refill requests, have your pharmacy contact our office.    Bone Scan Your caregiver has requested that you have a bone scan. In this test a very small amount of  radioactive material is injected into a vein (intravenously). This radioactive material targets your bones, but the highest concentration occurs at areas of:  Injury.  Disease. Your bones can then be scanned by a machine that detects radioactive material. The scanning is painless. The scan can detect bone damage from various causes and will sometimes detect very small defects in bones that ordinary X-rays cannot. It can take up to 4 hours to finish a scan. LET YOUR CAREGIVERS KNOW ABOUT:  Allergies.  Medicines taken including herbs, eye drops, over-the-counter medicines, and creams.  Use of steroids (by mouth or creams).  Previous problems with anesthetics.  Possibility of pregnancy, if this applies.  History of blood clots.  History of bleeding or blood problems.  Previous surgery.  Other health problems. PREPARATION  Drink extra fluids for 24 hours prior to the exam.  Do not have exams using barium or X-ray contrast medium for 48 hours before the exam.  Continue with normal routines regarding food and medicines, or as told by your caregiver.  Do not wear jewelry or clothes with metal to the exam. BEFORE THE PROCEDURE You should be present 60 minutes prior to your procedure, or as directed by your health care provider. PROCEDURE   Radioactive material is injected intravenously over approximately 10 minutes.  There generally are no side effects from the injection.  Depending on the type of exam, images are taken at varying time intervals for up to 4 hours. Your technologist performing the test will let  you know what time intervals will be used for your test. You may go on with normal activities in between scans. DURING THE WAITING PERIODS  Continue drinking extra fluids.  Empty your bladder often as this eliminates the radioactive material that your bones do not use.  Eat and take medicines normally during the test. AFTER THE PROCEDURE  You may return to all normal  activities.  All of the radioactive material will eventually leave your body through your urine. The radioactive material used is in such a small amount you should have no long-term effect from this test. Your scan will be interpreted by a specialist in reading X-rays (radiologist) with special training in nuclear medicine. He or she will send a report of this scan to your primary caregiver. It may take a couple days for your primary caregiver to receive and review these results. Find out how you are to receive your results, or call for your results as instructed by your caregiver. Remember, it is your responsibility to obtain the results of your procedure. Do not assume everything is fine because you do not hear from your caregiver. Document Released: 08/13/2000 Document Revised: 08/21/2013 Document Reviewed: 10/14/2008 [facility] Patient Information 2015 [place], [place]. This information is not intended to replace advice given to you by your health care provider. Make sure you discuss any questions you have with your health care provider.

## 2015-01-02 NOTE — Progress Notes (Addendum)
[facility] [place] NOTE  Patient Care Team: No Pcp Per Patient as PCP - General (General Practice)  CHIEF COMPLAINTS/PURPOSE OF CONSULTATION:  Potential multiple myeloma Enlarged lymph nodes in pelvis Elevated Sed rate Abscess on right scrotum  HISTORY OF PRESENTING ILLNESS:  Robert Acevedo 44 y.o. male is here because of lymphadenopathy in the groin and abnormal CT imaging. He reports developing a knot in his left groin 2 years ago. He states it grew slowly over time. He did not seek medical care. He then developed an abscessed tooth approximately the same time. He  had to have surgery. He notes he had to have "straws put in his jaw to drain it  He states he has had multiple dental issues and that he [facility]. He had a routine physical exam and the "knot" in his groin was noted. CT scan of the pelvis with contrast was performed on 12/20/2014 and shows adenopathy in the left groin involving the left external iliac chain largest lymph node measuring 41 x 22 mm. In addition multiple lytic lesions were noted in the iliac bones and sacrum with appearance consistent with multiple myeloma. He came in to [facility] several days ago for laboratory studies and is here to review them and make additional recommendations regarding ongoing evaluation.  He reports reports night sweats to the point where he has to change shirts because the neck and back are so wet, almost feeling like he was in the shower. Lost 9 lbs over the past year. He states he has not been eating right in "a while". He feels full easily. Pt reports headaches, describing them as being short duration and stabbing in nature.  Girlfriend reports he wakes up every 2 to 3 hours in the night. Pt denies any feelings of depression since getting out of prison. Reports vision changes, holding reading material further away (probably just natural aging).    Today he states he has a "boil" on his scrotal area.  Noticed it Wednesday morning. About the size of a quarter. He says he switched to boxers which cause more sweating in the groin area.   MEDICAL HISTORY:  Past Medical History  Diagnosis Date  . Bronchitis     SURGICAL HISTORY: Past Surgical History  Procedure Laterality Date  . Wrist surgery      right  . Knee surgery    . Incision and drainage peritonsillar abscess      SOCIAL HISTORY: History   Social History  . Marital Status: Single    Spouse Name: N/A  . Number of Children: N/A  . Years of Education: N/A   Occupational History  . Not on file.   Social History Main Topics  . Smoking status: Current Every Day Smoker -- 0.50 packs/day for 2 years    Types: Cigarettes  . Smokeless tobacco: Never Used  . Alcohol Use: Yes     Comment: occasional  . Drug Use: Yes    Special: Marijuana     Comment: on occasion - last used 10/08/14  . Sexual Activity: Yes    Birth Control/ Protection: None   Other Topics Concern  . Not on file   Social History Narrative  Pt is a smoker starting 2008, pack of cigarettes a week. Infrequent drinking alcohol, maybe one or two once a month. Denies drug use other than daily marijuana. In jail for 18 months.  Has  3 kids, 71 yo, 63 yo, and 68 yo. Has 1 grandchild.    FAMILY HISTORY: Family History  Problem Relation Age of Onset  . Seizures Mother   . Diabetes Father   Father is 70 yo diabetic. Mother is 58 yo. Both still living. 1 brother, 4 sisters. One sister has breast cancer, diagnosed 1996 in her 68s. Another sister has severe depression.   ALLERGIES:  is allergic to clindamycin/lincomycin.  MEDICATIONS:  Current Outpatient Prescriptions  Medication Sig Dispense Refill  . benzonatate (TESSALON) 200 MG capsule Take 1 capsule (200 mg total) by mouth 3 (three) times daily as needed for cough. 30 capsule 0  . ibuprofen (ADVIL,MOTRIN) 200 MG tablet Take 400 mg by mouth every 6  (six) hours as needed for pain.    . cephALEXin (KEFLEX) 500 MG capsule Take 1 capsule (500 mg total) by mouth 3 (three) times daily. 21 capsule 0  . HYDROcodone-acetaminophen (NORCO/VICODIN) 5-325 MG per tablet Take 1 tablet by mouth every 4 (four) hours as needed for severe pain. 20 tablet 0   No current facility-administered medications for this visit.    Review of Systems  Constitutional: Positive for weight loss and diaphoresis. Negative for fever, chills and malaise/fatigue.  HENT: Negative for congestion, ear discharge, ear pain, hearing loss, nosebleeds, sore throat and tinnitus.        Tooth abscess.  Eyes: Positive for blurred vision. Negative for double vision, photophobia, pain, discharge and redness.  Respiratory: Negative.  Negative for stridor.   Cardiovascular: Negative.   Gastrointestinal: Positive for constipation. Negative for heartburn, nausea, vomiting, abdominal pain, diarrhea, blood in stool and melena.       "early satiety"  Genitourinary: Positive for urgency. Negative for dysuria, frequency, hematuria and flank pain.       Pain in testicle from abscess.  Musculoskeletal: Negative.        Right ankle feels "stiff".  Skin: Negative.        "scrotal boil"  Neurological: Positive for headaches. Negative for dizziness, tingling, tremors, sensory change, speech change, focal weakness, seizures, loss of consciousness and weakness.  Endo/Heme/Allergies: Negative.   Psychiatric/Behavioral: Positive for substance abuse. Negative for depression, suicidal ideas, hallucinations and memory loss. The patient has insomnia. The patient is not nervous/anxious.        Girlfriend reports pt wakes up every 2 to 3 hours in the night.   14 point ROS was done and is otherwise as detailed above or in HPI   PHYSICAL EXAMINATION: ECOG PERFORMANCE STATUS: 1 - Symptomatic but completely ambulatory  Filed Vitals:   01/02/15 1410  BP: 108/60  Pulse: 78  Temp: 98.8 F (37.1 C)  Resp:  16   Filed Weights   01/02/15 1410  Weight: 173 lb 8 oz (78.699 kg)   Physical Exam  Constitutional: He is oriented to person, place, and time and well-developed, well-nourished, and in no distress.  HENT:  Head: Normocephalic and atraumatic.  Nose: Nose normal.  Mouth/Throat: Oropharynx is clear and moist. No oropharyngeal exudate.  Eyes: Conjunctivae and EOM are normal. Pupils are equal, round, and reactive to light. Right eye exhibits no discharge. Left eye exhibits no discharge. No scleral icterus.  Neck: Normal range of motion. Neck supple. No tracheal deviation present. No thyromegaly present.  Cardiovascular: Normal rate, regular rhythm and normal heart sounds.  Exam reveals no gallop and no friction rub.   No murmur heard. Pulmonary/Chest: Effort normal and breath sounds normal. He has no wheezes. He has no  rales.  Abdominal: Soft. Bowel sounds are normal. He exhibits no distension and no mass. There is no tenderness. There is no rebound and no guarding.  Genitourinary:  Abscess on right scrotum., small approximately 1 cm, mobile and not draining.  Musculoskeletal: Normal range of motion. He exhibits no edema.  Has trouble ambulating due to the boil on his scrotum.   Lymphadenopathy:       Head (right side): No submental, no submandibular, no tonsillar, no preauricular, no posterior auricular and no occipital adenopathy present.       Head (left side): No submental, no submandibular, no tonsillar, no preauricular, no posterior auricular and no occipital adenopathy present.    He has no cervical adenopathy.       Right cervical: No superficial cervical adenopathy present.      Left cervical: No superficial cervical adenopathy present.       Right axillary: No pectoral and no lateral adenopathy present.       Left axillary: No pectoral and no lateral adenopathy present.      Right: No inguinal adenopathy present.       Left: Inguinal adenopathy present.  2 palpable lymph nodes  in left groin. 4 cm mobile lymph node, inferior to it is a smaller mobile lymph node 1.5 cm.  Neurological: He is alert and oriented to person, place, and time. He has normal reflexes. No cranial nerve deficit. Gait normal. Coordination normal.  Skin: Skin is warm and dry. No rash noted.  Psychiatric: Mood, memory, affect and judgment normal.  Nursing note and vitals reviewed.   LABORATORY DATA:  I have reviewed the data as listed Lab Results  Component Value Date   WBC 6.1 12/27/2014   HGB 13.3 12/27/2014   HCT 40.8 12/27/2014   MCV 89.7 12/27/2014   PLT 210 12/27/2014   Results for CHIEF, WALKUP (MRN [id number]) as of 01/03/2015 12:45  Ref. Range 12/27/2014 09:49 12/27/2014 09:50 01/02/2015 15:30 01/02/2015 16:08  Sodium Latest Ref Range: 135-145 mmol/L 138     Potassium Latest Ref Range: 3.5-5.1 mmol/L 3.9     Chloride Latest Ref Range: 96-112 mmol/L 103     CO2 Latest Ref Range: 19-32 mmol/L 28     BUN Latest Ref Range: 6-23 mg/dL 17     Creatinine Latest Ref Range: 0.61-1.24 mg/dL 1.26  1.22   Calcium Latest Ref Range: 8.4-10.5 mg/dL 9.0     EGFR (Non-African Amer.) Latest Ref Range: >60 mL/min 69 (L)  >60   EGFR (African American) Latest Ref Range: >60 mL/min 80 (L)  >60   Glucose Latest Ref Range: 70-99 mg/dL 90     Anion gap Latest Ref Range: 5-15  7     Alkaline Phosphatase Latest Ref Range: 39-117 U/L 50     Albumin Latest Ref Range: 3.5-5.2 g/dL 3.7     AST Latest Ref Range: 0-37 U/L 23     ALT Latest Ref Range: 0-53 U/L 18     Total Protein Latest Ref Range: 6.0-8.3 g/dL 9.0 (H)     Total Bilirubin Latest Ref Range: 0.3-1.2 mg/dL 0.7     LDH Latest Ref Range: 94-250 U/L 153     Beta-2 Microglobulin Latest Ref Range: 0.6-2.4 mg/L 2.5 (H)     CRP Latest Ref Range: <0.60 mg/dL <0.5 (L)     Total Protein ELP Latest Ref Range: 6.0-8.5 g/dL  8.7 (H)    Albumin SerPl Elph-Mcnc Latest Ref Range: 3.2-5.6 g/dL  3.5  Albumin/Glob SerPl Latest Ref Range: 0.7-2.0   0.7     Alpha2 Glob SerPl Elph-Mcnc Latest Ref Range: 0.4-1.2 g/dL  0.8    Alpha 1 Latest Ref Range: 0.1-0.4 g/dL  0.2    Gamma Glob SerPl Elph-Mcnc Latest Ref Range: 0.5-1.6 g/dL  3.2 (H)    IFE 1 Unknown  Comment    Globulin, Total Latest Ref Range: 2.0-4.5 g/dL  5.2 (H)    B-Globulin SerPl Elph-Mcnc Latest Ref Range: 0.6-1.3 g/dL  1.0    IgG (Immunoglobin G), Serum Latest Ref Range: 700-1600 mg/dL  3323 (H)    IgA Latest Ref Range: 90-386 mg/dL  380    IgM, Serum Latest Ref Range: 20-172 mg/dL  199 (H)    Kappa free light chain Latest Ref Range: 3.30-19.40 mg/L 77.82 (H)     Lamda free light chains Latest Ref Range: 5.71-26.30 mg/L 39.96 (H)     Kappa, lamda light chain ratio Latest Ref Range: 0.26-1.65  1.95 (H)     M Protein SerPl Elph-Mcnc Latest Ref Range: Not Observed g/dL  Not Observed    WBC Latest Ref Range: 4.0-10.5 K/uL 6.1     RBC Latest Ref Range: 4.22-5.81 MIL/uL 4.55     Hemoglobin Latest Ref Range: 13.0-17.0 g/dL 13.3     HCT Latest Ref Range: 39.0-52.0 % 40.8     MCV Latest Ref Range: 78.0-100.0 fL 89.7     MCH Latest Ref Range: 26.0-34.0 pg 29.2     MCHC Latest Ref Range: 30.0-36.0 g/dL 32.6     RDW Latest Ref Range: 11.5-15.5 % 14.4     Platelets Latest Ref Range: 150-400 K/uL 210     Neutrophils Latest Ref Range: 43-77 % 38 (L)     Lymphocytes Latest Ref Range: 12-46 % 46     Monocytes Relative Latest Ref Range: 3-12 % 13 (H)     Eosinophil Latest Ref Range: 0-5 % 3     Basophil Latest Ref Range: 0-1 % 1     NEUT# Latest Ref Range: 1.7-7.7 K/uL 2.3     Lymphocyte # Latest Ref Range: 0.7-4.0 K/uL 2.8     Monocyte # Latest Ref Range: 0.1-1.0 K/uL 0.8     Eosinophils Absolute Latest Ref Range: 0.0-0.7 K/uL 0.2     Basophils Absolute Latest Ref Range: 0.0-0.1 K/uL 0.0     Sed Rate Latest Ref Range: 0-16 mm/hr 45 (H)     TSH Latest Ref Range: 0.350-4.500 uIU/mL   0.492   T4, Total Latest Ref Range: 4.5-12.0 ug/dL   5.0   Please Note (HCV): Unknown  Comment     Angiotensin-Converting Enzyme Latest Ref Range: 14-82 U/L   27   Protein, Urine Latest Units: mg/dL    13  Creatinine, Urine Latest Units: mg/dL    194.11  Urine Total Volume-CRCL Latest Units: mL    2125  Collection Interval-CRCL Latest Units: hours    24  Creatinine, 24H Ur Latest Ref Range: 800-2000 mg/day    4125 (H)  Creatinine Clearance Latest Ref Range: 75-125 mL/min    235 (H)  Urine Total Volume-UPROT Latest Units: mL    2125  Collection Interval-UPROT Latest Units: hours    24  Protein, 24H Urine Latest Ref Range: 50-100 mg/day    276 (H)    RADIOGRAPHIC STUDIES:   CLINICAL DATA: Left groin mass for 18 months.  EXAM: CT PELVIS WITH CONTRAST  TECHNIQUE: Multidetector CT imaging of the pelvis was performed using the standard protocol   following the bolus administration of intravenous contrast.  CONTRAST: 161m OMNIPAQUE IOHEXOL 300 MG/ML SOLN  COMPARISON: Ultrasound dated 12/18/2014  FINDINGS: There are multiple enlarged left inguinal lymph nodes. The largest node measures 41 x 22 mm on the coronal image. A slightly more inferior and medial node measures 23 x 16 mm and an external iliac node that measures 17 x 11 mm on the sagittal images.  There are multiple small nodes in the right inguinal region. No other discrete adenopathy is visible in the pelvis.  The patient has numerous lytic lesions in the iliac bones bilaterally some involving the cortex. The largest lesion is in the left ilium measuring 22 mm on the coronal image. There are 2 lytic lesions in the sacrum. Proximal femurs appear normal.  IMPRESSION: 1. Adenopathy in the left groin and involving the left external iliac chain. The adenopathy is nonspecific. 2. Multiple lytic lesions in the iliac bones and sacrum. This appearance is most consistent with multiple myeloma.   Electronically Signed  By: JLorriane ShireM.D.  On: 12/20/2014 22:50   I have personally reviewed the  radiological images as listed and agreed with the findings in the report.  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 He has no evidence of anemia, hypercalcemia, no M spike, and no significant renal disease.  I am going to check a serum ace level today as it is possible given his CT imaging, elevated sedimentation rate that he may have something such as sarcoidosis. I have recommended an excisional biopsy of the large lymph node in the left groin. Advised the patient that we will be our starting point. If it is not diagnostic then we will proceed with a biopsy of the pelvic bone. I am also going to set him up for a bone scan. The results of his bone scan and lymph node biopsy we will think about completing his CT imaging of the chest and abdomen.  His scrotal abscess is very small and I have advised him to do sitz baths several times a day. He was given a prescription for Keflex.  Orders Placed This Encounter  Procedures  . NM Bone Scan Whole Body    Standing Status: Future     Number of Occurrences:      Standing Expiration Date: 01/02/2016    Order Specific Question:  Reason for Exam (SYMPTOM  OR DIAGNOSIS REQUIRED)    Answer:  lytic bone lesions in pelvis    Order Specific Question:  Preferred imaging location?    Answer[facility] . T4 AND TSH  . Angiotensin converting enzyme  . TSH    Standing Status: Future     Number of Occurrences: 1     Standing Expiration Date: 01/02/2016  . T4    Standing Status: Future     Number of Occurrences: 1     Standing Expiration Date: 01/02/2016  . Creatinine, serum   The patient will follow up with me once his bone scan is complete and pathology is available from his lymph node biopsy. Additional recommendations will be made at  that time.  All questions were answered. The patient knows to call the clinic with any problems, questions or concerns.  This note was electronically signed.    This document serves as  a record of services personally performed by  , MD. It was created on her behalf by Elizabeth Ashley, a trained medical scribe. The creation of this record is based on the scribe's personal observations and the provider's statements to them. This document has been checked and approved by the attending provider. I have reviewed the above documentation for accuracy and completeness and I agree with the above.    , MD 

## 2015-01-03 ENCOUNTER — Encounter (HOSPITAL_COMMUNITY): Payer: Self-pay

## 2015-01-03 ENCOUNTER — Encounter (HOSPITAL_COMMUNITY): Admission: RE | Admit: 2015-01-03 | Payer: Self-pay | Source: Ambulatory Visit

## 2015-01-03 LAB — ANGIOTENSIN CONVERTING ENZYME: Angiotensin-Converting Enzyme: 27 U/L (ref 14–82)

## 2015-01-03 LAB — T4: T4 TOTAL: 5 ug/dL (ref 4.5–12.0)

## 2015-01-03 NOTE — Progress Notes (Unsigned)
Appt for Dr Arnoldo Morale on 5/12 at 9.  Patient aware of appt

## 2015-01-06 LAB — UIFE/LIGHT CHAINS/TP QN, 24-HR UR
ALPHA 2 UR: 0 %
Albumin, U: 100 %
Alpha 1, Urine: 0 %
Beta, Urine: 0 %
FREE LT CHN EXCR RATE: 322 mg/L — AB (ref 1.35–24.19)
Free Kappa/Lambda Ratio: 17.22 — ABNORMAL HIGH (ref 2.04–10.37)
Free Lambda Lt Chains,Ur: 18.7 mg/L — ABNORMAL HIGH (ref 0.24–6.66)
Gamma Globulin, Urine: 0 %
TOTAL PROTEIN, URINE-UR/DAY: 274.1 mg/(24.h) — AB (ref 30.0–150.0)
Time: 24 hours
Total Protein, Urine: 12.9 mg/dL (ref 0.0–15.0)
Volume, Urine: 2125 mL

## 2015-01-08 ENCOUNTER — Encounter (HOSPITAL_COMMUNITY): Payer: Self-pay

## 2015-01-08 ENCOUNTER — Encounter (HOSPITAL_COMMUNITY)
Admission: RE | Admit: 2015-01-08 | Discharge: 2015-01-08 | Disposition: A | Discharge status: Home / Self Care | Payer: Self-pay | Source: Ambulatory Visit | Attending: Hematology & Oncology | Admitting: Hematology & Oncology

## 2015-01-08 DIAGNOSIS — R599 Enlarged lymph nodes, unspecified: Secondary | ICD-10-CM | POA: Insufficient documentation

## 2015-01-08 DIAGNOSIS — R590 Localized enlarged lymph nodes: Secondary | ICD-10-CM

## 2015-01-08 DIAGNOSIS — M899 Disorder of bone, unspecified: Secondary | ICD-10-CM | POA: Insufficient documentation

## 2015-01-08 MED ORDER — TECHNETIUM TC 99M MEDRONATE IV KIT: 25.0000 | PACK | Freq: Once | INTRAVENOUS | Status: AC | PRN

## 2015-01-10 ENCOUNTER — Encounter (HOSPITAL_COMMUNITY): Payer: Self-pay

## 2015-01-10 ENCOUNTER — Encounter (HOSPITAL_COMMUNITY)
Admission: RE | Admit: 2015-01-10 | Discharge: 2015-01-10 | Disposition: A | Discharge status: Home / Self Care | Payer: Self-pay | Source: Ambulatory Visit | Attending: General Surgery | Admitting: General Surgery

## 2015-01-10 DIAGNOSIS — R591 Generalized enlarged lymph nodes: Secondary | ICD-10-CM | POA: Insufficient documentation

## 2015-01-10 DIAGNOSIS — Z01818 Encounter for other preprocedural examination: Secondary | ICD-10-CM | POA: Insufficient documentation

## 2015-01-10 HISTORY — DX: Localized enlarged lymph nodes: R59.0

## 2015-01-10 NOTE — Patient Instructions (Signed)
Your procedure is scheduled on: 01/13/2015  Report to [facility] at 8:00    AM.  Call this number if you have problems the morning of surgery: [phone]   Remember:   Do not drink or eat food:After Midnight.  :  Do not wear jewelry, make-up or nail polish.  Do not wear lotions, powders, or perfumes. You may wear deodorant.  Do not shave 48 hours prior to surgery. Men may shave face and neck.  Do not bring valuables to the hospital.  Contacts, dentures or bridgework may not be worn into surgery.  Leave suitcase in the car. After surgery it may be brought to your room.  For patients admitted to the hospital, checkout time is 11:00 AM the day of discharge.   Patients discharged the day of surgery will not be allowed to drive home.    Special Instructions: Shower using CHG night before surgery and shower the day of surgery use CHG.  Use special wash - you have one bottle of CHG for all showers.  You should use approximately 1/2 of the bottle for each shower.   Please read over the following fact sheets that you were given: Pain Booklet, MRSA Information, Surgical Site Infection Prevention and Care and Recovery After Surgery  Lymph Node Biopsy Care After Refer to this sheet in the next few weeks. These instructions provide you with information on caring for yourself after your procedure. Your caregiver may also give you more specific instructions. Your treatment has been planned according to current medical practices, but problems sometimes occur. Call your caregiver if you have any problems or questions after your procedure. HOME CARE INSTRUCTIONS  Avoid vigorous exercise. Ask your caregiver when you can return to your normal activities.  You may shower 24 hours after your procedure. It is okay to get your surgical cut (incision) wet. Gently pat the incision dry after you shower.  If you are given a surgical bra, wear it for the next 48 hours. You may remove the bra to shower.  Keep  all follow-up appointments as directed by your caregiver.  If you have skin adhesive strips over the incision, do not remove them. They will fall off on their own over time.  Only take over-the-counter or prescription medicines for pain, fever, or discomfort as directed by your caregiver.  You may resume your regular diet. SEEK IMMEDIATE MEDICAL CARE IF:   Your pain is not controlled with medicine.  You notice redness, swelling, or increased fluid draining from the incision.  You feel nauseous or vomit. MAKE SURE YOU:  Understand these instructions.  Will watch your condition.  Will get help right away if you are not doing well or get worse. Document Released: 03/30/2004 Document Revised: 11/08/2011 Document Reviewed: 10/05/2013 [facility] Patient Information 2015 [place], [place]. This information is not intended to replace advice given to you by your health care provider. Make sure you discuss any questions you have with your health care provider. General Anesthesia General anesthesia is a sleep-like state of non-feeling produced by medicines (anesthetics). General anesthesia prevents you from being alert and feeling pain during a medical procedure. Your caregiver may recommend general anesthesia if your procedure:  Is long.  Is painful or uncomfortable.  Would be frightening to see or hear.  Requires you to be still.  Affects your breathing.  Causes significant blood loss. LET YOUR CAREGIVER KNOW ABOUT:  Allergies to food or medicine.  Medicines taken, including vitamins, herbs, eyedrops, over-the-counter medicines, and creams.  Use of steroids (by mouth or creams).  Previous problems with anesthetics or numbing medicines, including problems experienced by relatives.  History of bleeding problems or blood clots.  Previous surgeries and types of anesthetics received.  Possibility of pregnancy, if this applies.  Use of cigarettes, alcohol, or illegal drugs.  Any  health condition(s), especially diabetes, sleep apnea, and high blood pressure. RISKS AND COMPLICATIONS General anesthesia rarely causes complications. However, if complications do occur, they can be life threatening. Complications include:  A lung infection.  A stroke.  A heart attack.  Waking up during the procedure. When this occurs, the patient may be unable to move and communicate that he or she is awake. The patient may feel severe pain. Older adults and adults with serious medical problems are more likely to have complications than adults who are young and healthy. Some complications can be prevented by answering all of your caregiver's questions thoroughly and by following all pre-procedure instructions. It is important to tell your caregiver if any of the pre-procedure instructions, especially those related to diet, were not followed. Any food or liquid in the stomach can cause problems when you are under general anesthesia. BEFORE THE PROCEDURE  Ask your caregiver if you will have to spend the night at the hospital. If you will not have to spend the night, arrange to have an adult drive you and stay with you for 24 hours.  Follow your caregiver's instructions if you are taking dietary supplements or medicines. Your caregiver may tell you to stop taking them or to reduce your dosage.  Do not smoke for as long as possible before your procedure. If possible, stop smoking 3-6 weeks before the procedure.  Do not take new dietary supplements or medicines within 1 week of your procedure unless your caregiver approves them.  Do not eat within 8 hours of your procedure or as directed by your caregiver. Drink only clear liquids, such as water, black coffee (without milk or cream), and fruit juices (without pulp).  Do not drink within 3 hours of your procedure or as directed by your caregiver.  You may brush your teeth on the morning of the procedure, but make sure to spit out the toothpaste  and water when finished. PROCEDURE  You will receive anesthetics through a mask, through an intravenous (IV) access tube, or through both. A doctor who specializes in anesthesia (anesthesiologist) or a nurse who specializes in anesthesia (nurse anesthetist) or both will stay with you throughout the procedure to make sure you remain unconscious. He or she will also watch your blood pressure, pulse, and oxygen levels to make sure that the anesthetics do not cause any problems. Once you are asleep, a breathing tube or mask may be used to help you breathe. AFTER THE PROCEDURE You will wake up after the procedure is complete. You may be in the room where the procedure was performed or in a recovery area. You may have a sore throat if a breathing tube was used. You may also feel:  Dizzy.  Weak.  Drowsy.  Confused.  Nauseous.  Cold. These are all normal responses and can be expected to last for up to 24 hours after the procedure is complete. A caregiver will tell you when you are ready to go home. This will usually be when you are fully awake and in stable condition. Document Released: 11/23/2007 Document Revised: 12/31/2013 Document Reviewed: 12/15/2011 [facility] Patient Information 2015 [place], [place]. This information is not intended to replace advice  to you by your health care provider. Make sure you discuss any questions you have with your health care provider.  

## 2015-01-10 NOTE — H&P (Signed)
  NTS SOAP Note  Vital Signs:  Vitals as of: 6/44/0347: Systolic 425: Diastolic 69: Heart Rate 87: Temp 100F: Height 1ft 9in: Weight 168Lbs 0 Ounces: Pain Level 7: BMI 24.81  BMI : 24.81 kg/m2  Subjective: This 44 year old male presents for of left inguinal lymph node swelling.  Has been present for some time.  Referred from oncology for biopsy.  Review of Symptoms:  Constitutional:unremarkable   Head:unremarkable Eyes:blurred vision bilateral Nose/Mouth/Throat:unremarkable Cardiovascular:  unremarkable Respiratory:dyspnea, cough Gastrointestinal:  unremarkable   Genitourinary:frequency joint, neck, and back pain Skin:unremarkable as above Allergic/Immunologic:unremarkable   Past Medical History:  Reviewed  Past Medical History  Surgical History: no major surgery Medical Problems: none Allergies: cleocin Medications: keflex, benzonatate   Social History:Reviewed  Social History  Preferred Language: English Race:  Black or African American Ethnicity: Not Hispanic / Latino Age: 25 year Marital Status:  S Alcohol: 3 beers a week   Smoking Status: Current every day smoker reviewed on 01/09/2015 Started Date:  Packs per day: 0.50 Functional Status reviewed on 01/09/2015 ------------------------------------------------ Bathing: Normal Cooking: Normal Dressing: Normal Driving: Normal Eating: Normal Managing Meds: Normal Oral Care: Normal Shopping: Normal Toileting: Normal Transferring: Normal Walking: Normal Cognitive Status reviewed on 01/09/2015 ------------------------------------------------ Attention: Normal Decision Making: Normal Language: Normal Memory: Normal Motor: Normal Perception: Normal Problem Solving: Normal Visual and Spatial: Normal   Family History:Reviewed  Family Health History Mother, Living; Healthy;  Father, Living; Diabetes mellitus, unspecified type;     Objective Information: General:Well  appearing, well nourished in no distress. Skin:  no rash or prominent lesions Heart:RRR, no murmur Lungs:  CTA bilaterally, no wheezes, rhonchi, rales.  Breathing unlabored. Abdomen:Soft, NT/ND, no HSM, no masses.  Enlarged left inguinal lymph node.  Assessment:Left inguinal lymphadenopathy  Diagnoses: 289.3  I88.9 Lymphadenitis (Nonspecific lymphadenitis, unspecified)  Procedures: 95638 - OFFICE OUTPATIENT NEW 30 MINUTES    Plan:  Scheduled for left inguinal lymph node biopsy on 01/13/15.   Patient Education:Alternative treatments to surgery were discussed with patient (and family).  Risks and benefits  of procedure were fully explained to the patient (and family) who gave informed consent. Patient/family questions were addressed.  Follow-up:Pending Surgery

## 2015-01-12 ENCOUNTER — Other Ambulatory Visit (HOSPITAL_COMMUNITY): Payer: Self-pay | Admitting: Hematology & Oncology

## 2015-01-12 DIAGNOSIS — R809 Proteinuria, unspecified: Secondary | ICD-10-CM

## 2015-01-12 DIAGNOSIS — M898X5 Other specified disorders of bone, thigh: Secondary | ICD-10-CM

## 2015-01-13 ENCOUNTER — Encounter (HOSPITAL_COMMUNITY)
Admission: RE | Disposition: A | Discharge status: Home / Self Care | Payer: Self-pay | Source: Ambulatory Visit | Attending: General Surgery

## 2015-01-13 ENCOUNTER — Encounter (HOSPITAL_COMMUNITY): Payer: Self-pay | Admitting: *Deleted

## 2015-01-13 ENCOUNTER — Ambulatory Visit (HOSPITAL_COMMUNITY): Payer: Medicaid Other | Admitting: Anesthesiology

## 2015-01-13 ENCOUNTER — Ambulatory Visit (HOSPITAL_COMMUNITY)
Admission: RE | Admit: 2015-01-13 | Discharge: 2015-01-13 | Disposition: A | Discharge status: Home / Self Care | Payer: Medicaid Other | Source: Ambulatory Visit | Attending: General Surgery | Admitting: General Surgery

## 2015-01-13 DIAGNOSIS — R590 Localized enlarged lymph nodes: Secondary | ICD-10-CM | POA: Diagnosis present

## 2015-01-13 DIAGNOSIS — R52 Pain, unspecified: Secondary | ICD-10-CM

## 2015-01-13 DIAGNOSIS — Z79899 Other long term (current) drug therapy: Secondary | ICD-10-CM | POA: Diagnosis not present

## 2015-01-13 DIAGNOSIS — Z792 Long term (current) use of antibiotics: Secondary | ICD-10-CM | POA: Insufficient documentation

## 2015-01-13 DIAGNOSIS — C8115 Nodular sclerosis classical Hodgkin lymphoma, lymph nodes of inguinal region and lower limb: Secondary | ICD-10-CM | POA: Insufficient documentation

## 2015-01-13 DIAGNOSIS — F17210 Nicotine dependence, cigarettes, uncomplicated: Secondary | ICD-10-CM | POA: Diagnosis not present

## 2015-01-13 DIAGNOSIS — I889 Nonspecific lymphadenitis, unspecified: Secondary | ICD-10-CM | POA: Insufficient documentation

## 2015-01-13 HISTORY — PX: LYMPH NODE BIOPSY: SHX201

## 2015-01-13 SURGERY — LYMPH NODE BIOPSY
Anesthesia: General | Site: Groin | Laterality: Left

## 2015-01-13 MED ORDER — BUPIVACAINE HCL (PF) 0.5 % IJ SOLN
INTRAMUSCULAR | Status: AC
  Filled 2015-01-13: qty 30

## 2015-01-13 MED ORDER — CEFAZOLIN SODIUM-DEXTROSE 2-3 GM-% IV SOLR
2.0000 g | INTRAVENOUS | Status: AC
  Administered 2015-01-13: 2 g via INTRAVENOUS
  Filled 2015-01-13: qty 50

## 2015-01-13 MED ORDER — KETOROLAC TROMETHAMINE 30 MG/ML IJ SOLN
30.0000 mg | Freq: Once | INTRAMUSCULAR | Status: AC
  Administered 2015-01-13: 30 mg via INTRAVENOUS

## 2015-01-13 MED ORDER — LIDOCAINE HCL (CARDIAC) 20 MG/ML IV SOLN
INTRAVENOUS | Status: DC | PRN
  Administered 2015-01-13: 40 mg via INTRAVENOUS

## 2015-01-13 MED ORDER — BUPIVACAINE HCL (PF) 0.5 % IJ SOLN
INTRAMUSCULAR | Status: DC | PRN
  Administered 2015-01-13: 4 mL

## 2015-01-13 MED ORDER — PROPOFOL 10 MG/ML IV BOLUS
INTRAVENOUS | Status: AC
  Filled 2015-01-13: qty 20

## 2015-01-13 MED ORDER — FENTANYL CITRATE (PF) 250 MCG/5ML IJ SOLN
INTRAMUSCULAR | Status: DC | PRN
  Administered 2015-01-13 (×2): 25 ug via INTRAVENOUS
  Administered 2015-01-13: 50 ug via INTRAVENOUS

## 2015-01-13 MED ORDER — LACTATED RINGERS IV SOLN
INTRAVENOUS | Status: DC
  Administered 2015-01-13: 09:00:00 via INTRAVENOUS

## 2015-01-13 MED ORDER — LIDOCAINE HCL (PF) 1 % IJ SOLN
INTRAMUSCULAR | Status: AC
  Filled 2015-01-13: qty 5

## 2015-01-13 MED ORDER — ONDANSETRON HCL 4 MG/2ML IJ SOLN: 4.0000 mg | Freq: Once | INTRAMUSCULAR | Status: AC | PRN

## 2015-01-13 MED ORDER — PROPOFOL 10 MG/ML IV BOLUS
INTRAVENOUS | Status: DC | PRN
  Administered 2015-01-13: 160 mg via INTRAVENOUS

## 2015-01-13 MED ORDER — MIDAZOLAM HCL 2 MG/2ML IJ SOLN
INTRAMUSCULAR | Status: AC
  Filled 2015-01-13: qty 2

## 2015-01-13 MED ORDER — HYDROCODONE-ACETAMINOPHEN 5-325 MG PO TABS: 1.0000 | ORAL_TABLET | ORAL | Status: DC | PRN

## 2015-01-13 MED ORDER — FENTANYL CITRATE (PF) 100 MCG/2ML IJ SOLN
INTRAMUSCULAR | Status: AC
  Filled 2015-01-13: qty 2

## 2015-01-13 MED ORDER — KETOROLAC TROMETHAMINE 30 MG/ML IJ SOLN
INTRAMUSCULAR | Status: AC
  Filled 2015-01-13: qty 1

## 2015-01-13 MED ORDER — 0.9 % SODIUM CHLORIDE (POUR BTL) OPTIME
TOPICAL | Status: DC | PRN
  Administered 2015-01-13: 1000 mL

## 2015-01-13 MED ORDER — ONDANSETRON HCL 4 MG/2ML IJ SOLN
INTRAMUSCULAR | Status: AC
  Filled 2015-01-13: qty 2

## 2015-01-13 MED ORDER — FENTANYL CITRATE (PF) 250 MCG/5ML IJ SOLN
INTRAMUSCULAR | Status: AC
  Filled 2015-01-13: qty 5

## 2015-01-13 MED ORDER — CHLORHEXIDINE GLUCONATE 4 % EX LIQD: 1.0000 "application " | Freq: Once | CUTANEOUS | Status: DC

## 2015-01-13 MED ORDER — ONDANSETRON HCL 4 MG/2ML IJ SOLN
4.0000 mg | Freq: Once | INTRAMUSCULAR | Status: AC
Start: 2015-01-13 — End: 2015-01-13
  Administered 2015-01-13: 4 mg via INTRAVENOUS

## 2015-01-13 MED ORDER — FENTANYL CITRATE (PF) 100 MCG/2ML IJ SOLN: 25.0000 ug | INTRAMUSCULAR | Status: DC | PRN

## 2015-01-13 MED ORDER — MIDAZOLAM HCL 2 MG/2ML IJ SOLN
1.0000 mg | INTRAMUSCULAR | Status: DC | PRN
Start: 2015-01-13 — End: 2015-01-15
  Administered 2015-01-13 (×2): 2 mg via INTRAVENOUS

## 2015-01-13 MED ORDER — FENTANYL CITRATE (PF) 100 MCG/2ML IJ SOLN
25.0000 ug | INTRAMUSCULAR | Status: AC
  Administered 2015-01-13 (×2): 25 ug via INTRAVENOUS

## 2015-01-13 SURGICAL SUPPLY — 29 items
BAG HAMPER (MISCELLANEOUS) ×2 IMPLANT
CLOTH BEACON ORANGE TIMEOUT ST (SAFETY) ×2 IMPLANT
COVER LIGHT HANDLE STERIS (MISCELLANEOUS) ×4 IMPLANT
DECANTER SPIKE VIAL GLASS SM (MISCELLANEOUS) ×2 IMPLANT
ELECT NEEDLE TIP 2.8 STRL (NEEDLE) ×2 IMPLANT
ELECT REM PT RETURN 9FT ADLT (ELECTROSURGICAL) ×2
ELECTRODE REM PT RTRN 9FT ADLT (ELECTROSURGICAL) ×1 IMPLANT
GLOVE BIOGEL PI IND STRL 7.0 (GLOVE) ×1 IMPLANT
GLOVE BIOGEL PI INDICATOR 7.0 (GLOVE) ×1
GLOVE ECLIPSE 6.5 STRL STRAW (GLOVE) ×2 IMPLANT
GLOVE EXAM NITRILE MD LF STRL (GLOVE) ×2 IMPLANT
GLOVE SURG SS PI 7.5 STRL IVOR (GLOVE) ×4 IMPLANT
GOWN STRL REUS W/TWL LRG LVL3 (GOWN DISPOSABLE) ×6 IMPLANT
KIT ROOM TURNOVER APOR (KITS) ×2 IMPLANT
LIQUID BAND (GAUZE/BANDAGES/DRESSINGS) ×2 IMPLANT
MANIFOLD NEPTUNE II (INSTRUMENTS) ×2 IMPLANT
NEEDLE HYPO 25X1 1.5 SAFETY (NEEDLE) ×2 IMPLANT
NS IRRIG 1000ML POUR BTL (IV SOLUTION) ×2 IMPLANT
PACK MINOR (CUSTOM PROCEDURE TRAY) ×2 IMPLANT
PAD ARMBOARD 7.5X6 YLW CONV (MISCELLANEOUS) ×2 IMPLANT
PAD TELFA 3X4 1S STER (GAUZE/BANDAGES/DRESSINGS) IMPLANT
SET BASIN LINEN APH (SET/KITS/TRAYS/PACK) ×2 IMPLANT
SHEARS HARMONIC 9CM CVD (BLADE) ×2 IMPLANT
SPONGE INTESTINAL PEANUT (DISPOSABLE) ×2 IMPLANT
SUT VIC AB 3-0 SH 27 (SUTURE) ×1
SUT VIC AB 3-0 SH 27X BRD (SUTURE) ×1 IMPLANT
SUT VIC AB 4-0 PS2 27 (SUTURE) ×2 IMPLANT
SYR CONTROL 10ML LL (SYRINGE) ×2 IMPLANT
TOWEL OR 17X26 4PK STRL BLUE (TOWEL DISPOSABLE) ×2 IMPLANT

## 2015-01-13 NOTE — Anesthesia Preprocedure Evaluation (Signed)
Anesthesia Evaluation  Patient identified by MRN, date of birth, ID band Patient awake    Reviewed: Allergy & Precautions, NPO status , Patient's Chart, lab work & pertinent test results  History of Anesthesia Complications Negative for: history of anesthetic complications  Airway Mallampati: I  TM Distance: >3 FB     Dental  (+) Teeth Intact, Dental Advisory Given   Pulmonary Current Smoker,  breath sounds clear to auscultation        Cardiovascular negative cardio ROS  Rhythm:Regular Rate:Normal     Neuro/Psych    GI/Hepatic   Endo/Other    Renal/GU      Musculoskeletal   Abdominal   Peds  Hematology   Anesthesia Other Findings   Reproductive/Obstetrics                             Anesthesia Physical Anesthesia Plan  ASA: I  Anesthesia Plan: General   Post-op Pain Management:    Induction: Intravenous  Airway Management Planned: LMA  Additional Equipment:   Intra-op Plan:   Post-operative Plan: Extubation in OR  Informed Consent:   Plan Discussed with:   Anesthesia Plan Comments:         Anesthesia Quick Evaluation

## 2015-01-13 NOTE — Anesthesia Postprocedure Evaluation (Signed)
  Anesthesia Post-op Note  Patient: Robert Acevedo  Procedure(s) Performed: Procedure(s): LEFT INGUINAL LYMPH NODE BIOPSY (Left)  Patient Location: PACU  Anesthesia Type:General  Level of Consciousness: awake, alert  and oriented  Airway and Oxygen Therapy: Patient Spontanous Breathing  Post-op Pain: none  Post-op Assessment: Post-op Vital signs reviewed, Patient's Cardiovascular Status Stable, Respiratory Function Stable, Patent Airway and No signs of Nausea or vomiting  Post-op Vital Signs: Reviewed and stable  Last Vitals:  Filed Vitals:   01/13/15 1030  BP: 117/77  Pulse: 61  Temp:   Resp: 14    Complications: No apparent anesthesia complications

## 2015-01-13 NOTE — Anesthesia Procedure Notes (Signed)
Procedure Name: LMA Insertion Date/Time: 01/13/2015 8:59 AM Performed by: Tressie Stalker E Pre-anesthesia Checklist: Patient identified, Patient being monitored, Emergency Drugs available, Timeout performed and Suction available Patient Re-evaluated:Patient Re-evaluated prior to inductionOxygen Delivery Method: Circle System Utilized Preoxygenation: Pre-oxygenation with 100% oxygen Intubation Type: IV induction Ventilation: Mask ventilation without difficulty LMA: LMA inserted LMA Size: 4.0 Number of attempts: 1 Placement Confirmation: positive ETCO2 and breath sounds checked- equal and bilateral

## 2015-01-13 NOTE — Discharge Instructions (Signed)
Swollen Lymph Nodes  The lymphatic system filters fluid from around cells. It is like a system of blood vessels. These channels carry lymph instead of blood. The lymphatic system is an important part of the immune (disease fighting) system. When people talk about "swollen glands in the neck," they are usually talking about swollen lymph nodes. The lymph nodes are like the little traps for infection. You and your caregiver may be able to feel lymph nodes, especially swollen nodes, in these common areas: the groin (inguinal area), armpits (axilla), and above the clavicle (supraclavicular). You may also feel them in the neck (cervical) and the back of the head just above the hairline (occipital).  Swollen glands occur when there is any condition in which the body responds with an allergic type of reaction. For instance, the glands in the neck can become swollen from insect bites or any type of minor infection on the head. These are very noticeable in children with only minor problems. Lymph nodes may also become swollen when there is a tumor or problem with the lymphatic system, such as Hodgkin's disease.  TREATMENT    Most swollen glands do not require treatment. They can be observed (watched) for a short period of time, if your caregiver feels it is necessary. Most of the time, observation is not necessary.   Antibiotics (medicines that kill germs) may be prescribed by your caregiver. Your caregiver may prescribe these if he or she feels the swollen glands are due to a bacterial (germ) infection. Antibiotics are not used if the swollen glands are caused by a virus.  HOME CARE INSTRUCTIONS    Take medications as directed by your caregiver. Only take over-the-counter or prescription medicines for pain, discomfort, or fever as directed by your caregiver.  SEEK MEDICAL CARE IF:    If you begin to run a temperature greater than 102 F (38.9 C), or as your caregiver suggests.  MAKE SURE YOU:    Understand these  instructions.   Will watch your condition.   Will get help right away if you are not doing well or get worse.  Document Released: 08/06/2002 Document Revised: 11/08/2011 Document Reviewed: 08/16/2005  ExitCare Patient Information 2015 [employer]. This information is not intended to replace advice given to you by your health care provider. Make sure you discuss any questions you have with your health care provider.

## 2015-01-13 NOTE — Op Note (Signed)
Patient:  Robert Acevedo  DOB:  Jun 30, 1971  MRN:  [id number]   Preop Diagnosis:  Inguinal lymphadenopathy  Postop Diagnosis:  Same  Procedure:  Left inguinal lymph node biopsy  Surgeon:  Aviva Signs, M.D.  Anes:  Gen.  Indications:  Patient is a 44 year old black male who presents with idiopathic inguinal lymphadenopathy. He has been referred by oncology for lymph node biopsy. The risks and benefits of the procedure including bleeding, infection, and swelling were fully explained to the patient, who gave informed consent.  Procedure note:  The patient is placed the supine position. After general anesthesia was administered, the left groin region was prepped and draped using the usual sterile technique with CBG. Surgical site confirmation was performed.  An incision was made over the left groin region to the subcutaneous tissue. A large greater than 2 cm lymph node was found. A smaller one just was noted medial to this. Both were removed in continuity using the harmonic scalpel without difficulty. They were sent to pathology for flow cytometry. No abnormal bleeding was noted. 0.5% Sensorcaine was instilled the surrounding wound. The subcutaneous layer was reapproximated using 3-0 Vicryl interrupted suture. The skin was closed using a 4-0 Vicryl subcuticular suture. Liquiband was applied.  All tape and needle counts were correct the end the procedure. Patient was awakened and transferred to PACU in stable condition.   Complications:  None  EBL:  None  Specimen:  Left inguinal lymph nodes

## 2015-01-13 NOTE — Transfer of Care (Signed)
Immediate Anesthesia Transfer of Care Note  Patient: Robert Acevedo  Procedure(s) Performed: Procedure(s): LEFT INGUINAL LYMPH NODE BIOPSY (Left)  Patient Location: PACU  Anesthesia Type:General  Level of Consciousness: awake  Airway & Oxygen Therapy: Patient Spontanous Breathing  Post-op Assessment: Report given to RN  Post vital signs: Reviewed and stable  Last Vitals:  Filed Vitals:   01/13/15 0840  BP: 119/73  Temp:   Resp: 19    Complications: No apparent anesthesia complications

## 2015-01-13 NOTE — Interval H&P Note (Signed)
History and Physical Interval Note:  01/13/2015 8:22 AM  Robert Acevedo  has presented today for surgery, with the diagnosis of lymphadenopathy  The various methods of treatment have been discussed with the patient and family. After consideration of risks, benefits and other options for treatment, the patient has consented to  Procedure(s): INGUINAL LYMPH NODE BIOPSY (Left) as a surgical intervention .  The patient's history has been reviewed, patient examined, no change in status, stable for surgery.  I have reviewed the patient's chart and labs.  Questions were answered to the patient's satisfaction.     Aviva Signs A

## 2015-01-15 ENCOUNTER — Encounter (HOSPITAL_COMMUNITY): Payer: Self-pay | Admitting: General Surgery

## 2015-01-15 ENCOUNTER — Encounter: Payer: Self-pay | Admitting: *Deleted

## 2015-01-15 ENCOUNTER — Ambulatory Visit (HOSPITAL_COMMUNITY): Payer: Self-pay | Admitting: Hematology & Oncology

## 2015-01-15 NOTE — Progress Notes (Signed)
[facility] Clinical Social Work  Clinical Social Work was referred by Futures trader for assessment of psychosocial needs due to missed appointment.  Clinical Social Worker contacted patient at home to offer support and assess for needs.  Pt reports he was unaware of the appointment and denies issues with transportation and can get to his appointments. Pt aware that scheduler will call to reschedule. CSW explained role of CSW and pt denied current needs/concerns. CSW to follow up with pt at future appointments.   Clinical Social Work interventions: Problem solving Identify any barriers to treatment plan   Robert Acevedo, [place] Tuesdays 8:30-1pm Wednesdays 8:30-12pm  [phone]

## 2015-01-20 ENCOUNTER — Other Ambulatory Visit: Payer: Self-pay | Admitting: Radiology

## 2015-01-20 ENCOUNTER — Other Ambulatory Visit: Payer: Self-pay | Admitting: Physician Assistant

## 2015-01-20 HISTORY — PX: BONE MARROW BIOPSY: SHX199

## 2015-01-20 HISTORY — PX: BONE MARROW ASPIRATION: SHX1252

## 2015-01-21 ENCOUNTER — Encounter (HOSPITAL_COMMUNITY): Payer: Self-pay

## 2015-01-21 ENCOUNTER — Ambulatory Visit (HOSPITAL_COMMUNITY)
Admission: RE | Admit: 2015-01-21 | Discharge: 2015-01-21 | Disposition: A | Discharge status: Home / Self Care | Payer: Medicaid Other | Source: Ambulatory Visit | Attending: Hematology & Oncology | Admitting: Hematology & Oncology

## 2015-01-21 DIAGNOSIS — C8190 Hodgkin lymphoma, unspecified, unspecified site: Secondary | ICD-10-CM | POA: Diagnosis present

## 2015-01-21 DIAGNOSIS — R809 Proteinuria, unspecified: Secondary | ICD-10-CM | POA: Insufficient documentation

## 2015-01-21 DIAGNOSIS — C8590 Non-Hodgkin lymphoma, unspecified, unspecified site: Secondary | ICD-10-CM | POA: Insufficient documentation

## 2015-01-21 DIAGNOSIS — D72822 Plasmacytosis: Secondary | ICD-10-CM | POA: Diagnosis not present

## 2015-01-21 DIAGNOSIS — D649 Anemia, unspecified: Secondary | ICD-10-CM | POA: Insufficient documentation

## 2015-01-21 DIAGNOSIS — M898X5 Other specified disorders of bone, thigh: Secondary | ICD-10-CM

## 2015-01-21 LAB — CBC WITH DIFFERENTIAL/PLATELET
Basophils Absolute: 0 10*3/uL (ref 0.0–0.1)
Basophils Relative: 0 % (ref 0–1)
EOS PCT: 3 % (ref 0–5)
Eosinophils Absolute: 0.2 10*3/uL (ref 0.0–0.7)
HCT: 36.8 % — ABNORMAL LOW (ref 39.0–52.0)
Hemoglobin: 11.8 g/dL — ABNORMAL LOW (ref 13.0–17.0)
Lymphocytes Relative: 41 % (ref 12–46)
Lymphs Abs: 2 10*3/uL (ref 0.7–4.0)
MCH: 28.4 pg (ref 26.0–34.0)
MCHC: 32.1 g/dL (ref 30.0–36.0)
MCV: 88.5 fL (ref 78.0–100.0)
Monocytes Absolute: 0.5 10*3/uL (ref 0.1–1.0)
Monocytes Relative: 11 % (ref 3–12)
Neutro Abs: 2.2 10*3/uL (ref 1.7–7.7)
Neutrophils Relative %: 45 % (ref 43–77)
Platelets: 220 10*3/uL (ref 150–400)
RBC: 4.16 MIL/uL — ABNORMAL LOW (ref 4.22–5.81)
RDW: 14.3 % (ref 11.5–15.5)
WBC: 4.8 10*3/uL (ref 4.0–10.5)

## 2015-01-21 LAB — PROTIME-INR
INR: 1.15 (ref 0.00–1.49)
Prothrombin Time: 14.9 seconds (ref 11.6–15.2)

## 2015-01-21 LAB — BONE MARROW EXAM: Bone Marrow Exam: 364

## 2015-01-21 MED ORDER — HYDROCODONE-ACETAMINOPHEN 5-325 MG PO TABS
1.0000 | ORAL_TABLET | ORAL | Status: DC | PRN
  Filled 2015-01-21: qty 2

## 2015-01-21 MED ORDER — FENTANYL CITRATE (PF) 100 MCG/2ML IJ SOLN
INTRAMUSCULAR | Status: AC
  Filled 2015-01-21: qty 4

## 2015-01-21 MED ORDER — MIDAZOLAM HCL 2 MG/2ML IJ SOLN
INTRAMUSCULAR | Status: AC
  Filled 2015-01-21: qty 6

## 2015-01-21 MED ORDER — MIDAZOLAM HCL 2 MG/2ML IJ SOLN
INTRAMUSCULAR | Status: AC | PRN
  Administered 2015-01-21 (×3): 1 mg via INTRAVENOUS

## 2015-01-21 MED ORDER — SODIUM CHLORIDE 0.9 % IV SOLN
INTRAVENOUS | Status: DC
  Administered 2015-01-21 (×2): via INTRAVENOUS

## 2015-01-21 MED ORDER — FENTANYL CITRATE (PF) 100 MCG/2ML IJ SOLN
INTRAMUSCULAR | Status: AC | PRN
  Administered 2015-01-21: 50 ug via INTRAVENOUS

## 2015-01-21 NOTE — Progress Notes (Signed)
Multiple attempts to call pts girlfiend-no answer. Pt agitated, "im leaving now".

## 2015-01-21 NOTE — Procedures (Signed)
Interventional Radiology Procedure Note  Procedure: CT guided aspirate and core biopsy of left iliac bone Complications: None EBL:  <25 mL Follow biopsy results   Robert Acevedo T. Kathlene Cote, M.D. Pager:  [phone]

## 2015-01-21 NOTE — Discharge Instructions (Signed)
Bone Biopsy, Needle, Care After  Read the instructions outlined below and refer to this sheet in the next few weeks. These discharge instructions provide you with general information on caring for yourself after you leave the hospital. Your caregiver may also give you specific instructions. While your treatment has been planned according to the most current medical practices available, unavoidable complications sometimes occur. If you have any problems or questions after discharge, call your caregiver.  Finding out the results of your test  Not all test results are available during your visit. If your test results are not back during the visit, make an appointment with your caregiver to find out the results. Do not assume everything is normal if you have not heard from your caregiver or the medical facility. It is important for you to follow up on all of your test results.   SEEK MEDICAL CARE IF:    You have redness, swelling, or increasing pain at the site of the biopsy.   You have pus coming from the biopsy site.   You have drainage from the biopsy site lasting longer than 1 day.   You notice a bad smell coming from the biopsy site or dressing.   You develop persistent nausea or vomiting.  SEEK IMMEDIATE MEDICAL CARE IF:   You have a fever.   You develop a rash.   You have difficulty breathing.   You develop any reaction or side effects to medicines given.  Document Released: 03/05/2005 Document Revised: 06/06/2013 Document Reviewed: 01/21/2009  ExitCare Patient Information 2015 [employer]. This information is not intended to replace advice given to you by your health care provider. Make sure you discuss any questions you have with your health care provider.        Conscious Sedation  Sedation is the use of medicines to promote relaxation and relieve discomfort and anxiety. Conscious sedation is a type of sedation. Under conscious sedation you are less alert than normal but are still able to respond  to instructions or stimulation. Conscious sedation is used during short medical and dental procedures. It is milder than deep sedation or general anesthesia and allows you to return to your regular activities sooner.   LET YOUR HEALTH CARE PROVIDER KNOW ABOUT:    Any allergies you have.   All medicines you are taking, including vitamins, herbs, eye drops, creams, and over-the-counter medicines.   Use of steroids (by mouth or creams).   Previous problems you or members of your family have had with the use of anesthetics.   Any blood disorders you have.   Previous surgeries you have had.   Medical conditions you have.   Possibility of pregnancy, if this applies.   Use of cigarettes, alcohol, or illegal drugs.  RISKS AND COMPLICATIONS  Generally, this is a safe procedure. However, as with any procedure, problems can occur. Possible problems include:   Oversedation.   Trouble breathing on your own. You may need to have a breathing tube until you are awake and breathing on your own.   Allergic reaction to any of the medicines used for the procedure.  BEFORE THE PROCEDURE   You may have blood tests done. These tests can help show how well your kidneys and liver are working. They can also show how well your blood clots.   A physical exam will be done.   Only take medicines as directed by your health care provider. You may need to stop taking medicines (such as blood thinners, aspirin,   or nonsteroidal anti-inflammatory drugs) before the procedure.    Do not eat or drink at least 6 hours before the procedure or as directed by your health care provider.   Arrange for a responsible adult, family member, or friend to take you home after the procedure. He or she should stay with you for at least 24 hours after the procedure, until the medicine has worn off.  PROCEDURE    An intravenous (IV) catheter will be inserted into one of your veins. Medicine will be able to flow directly into your body through this  catheter. You may be given medicine through this tube to help prevent pain and help you relax.   The medical or dental procedure will be done.  AFTER THE PROCEDURE   You will stay in a recovery area until the medicine has worn off. Your blood pressure and pulse will be checked.    Depending on the procedure you had, you may be allowed to go home when you can tolerate liquids and your pain is under control.  Document Released: 05/11/2001 Document Revised: 08/21/2013 Document Reviewed: 04/23/2013  ExitCare Patient Information 2015 [employer]. This information is not intended to replace advice given to you by your health care provider. Make sure you discuss any questions you have with your health care provider.

## 2015-01-21 NOTE — Progress Notes (Signed)
Post procedure assumed care for Pt while his nurse C. Laurance Flatten, RN looked in several places in the hospital for girlfriend, Trina Ao (who was his transportation home and presumed post procedure caregiver). Pt became increasingly aggitated and belligerent to staff after being told he needed his significant other present to be discharged. Pt was able to verbalize how to care for wound via "teach back" method.   After C. Laurance Flatten, RN was unable to reach Ms. Pulley by phone or locate her in the hospital, she resumed care for Robert Acevedo.  Security officers present. Rich Fuchs, Mudlogger, notified.

## 2015-01-21 NOTE — H&P (Signed)
Chief Complaint: "I'm having a bone marrow biopsy"  Referring Physician(s): Penland,Shannon K  History of Present Illness: Robert Acevedo is a 44 y.o. male with history of newly diagnosed Hodgkin's lymphoma and recent imaging revealing multiple lytic lesions in the iliac bones and sacrum. He presents today for CT-guided left iliac bone lesion/bone marrow biopsy for further evaluation.  Past Medical History  Diagnosis Date  . Bronchitis   . Lymphadenopathy, inguinal     left    Past Surgical History  Procedure Laterality Date  . Wrist surgery      right  . Knee surgery    . Incision and drainage peritonsillar abscess    . Lymph node biopsy Left 01/13/2015    Procedure: LEFT INGUINAL LYMPH NODE BIOPSY;  Surgeon: Aviva Signs Md, MD;  Location: [facility];  Service: General;  Laterality: Left;    Allergies: Clindamycin/lincomycin  Medications: Prior to Admission medications   Medication Sig Start Date End Date Taking? Authorizing Provider  HYDROcodone-acetaminophen (NORCO/VICODIN) 5-325 MG per tablet Take 1-2 tablets by mouth every 4 (four) hours as needed for severe pain. 01/13/15  Yes Aviva Signs Md, MD  benzonatate (TESSALON) 200 MG capsule Take 1 capsule (200 mg total) by mouth 3 (three) times daily as needed for cough. Patient not taking: Reported on 01/20/2015 11/04/14   Evalee Jefferson, PA-C    Family History  Problem Relation Age of Onset  . Seizures Mother   . Diabetes Father     History   Social History  . Marital Status: Single    Spouse Name: N/A  . Number of Children: N/A  . Years of Education: N/A   Social History Main Topics  . Smoking status: Current Every Day Smoker -- 0.50 packs/day for 2 years    Types: Cigarettes  . Smokeless tobacco: Never Used  . Alcohol Use: Yes     Comment: occasional  . Drug Use: Yes    Special: Marijuana     Comment: on occasion - last used 10/08/14  . Sexual Activity: Yes    Birth Control/ Protection: None   Other  Topics Concern  . None   Social History Narrative      Review of Systems  Constitutional: Positive for appetite change and unexpected weight change. Negative for fever.       Occasional chills, night sweats  Respiratory:       Occasional cough and dyspnea with exertion.  Cardiovascular: Negative for chest pain.  Gastrointestinal: Negative for nausea, vomiting, abdominal pain and blood in stool.  Genitourinary: Negative for dysuria and hematuria.  Musculoskeletal:       Occasional back pain  Neurological:       Occasional headaches    Vital Signs: BP 109/66 mmHg  Pulse 55  Temp(Src) 98.3 F (36.8 C) (Oral)  Resp 16  SpO2 100%  Physical Exam  Constitutional: He is oriented to person, place, and time. He appears well-developed and well-nourished.  Cardiovascular: Regular rhythm.   Slightly bradycardic  Pulmonary/Chest: Effort normal and breath sounds normal.  Abdominal: Soft. Bowel sounds are normal. There is no tenderness.  Musculoskeletal: Normal range of motion. He exhibits no edema.  Neurological: He is alert and oriented to person, place, and time.    Imaging: Nm Bone Scan Whole Body  01/08/2015   CLINICAL DATA:  Lytic bone lesions in pelvis on CT. Lymphadenopathy in inguinal regions. Rule out multiple myeloma.  EXAM: NUCLEAR MEDICINE WHOLE BODY BONE SCAN  TECHNIQUE: Whole body anterior  and posterior images were obtained approximately 3 hours after intravenous injection of radiopharmaceutical.  RADIOPHARMACEUTICALS:  24.0 Technetium-32mMDP IV  COMPARISON:  Pelvic CT of 12/20/2014  FINDINGS: No unexpected tracer uptake to suggest osseous metastasis. No pelvic uptake to correspond to the CT abnormalities. Physiologic tracer excretion from the kidneys and within the urinary bladder.  IMPRESSION: No evidence of bone metastasis and no correlate for the CT abnormality. As multiple myeloma can be a false-negative of bone scans, this remains a consideration. Consider chest/  abdominal CT and possibly tissue sampling.   Electronically Signed   By: KAbigail MiyamotoM.D.   On: 01/08/2015 12:54    Labs:  CBC:  Recent Labs  11/04/14 1635 12/27/14 0949 01/21/15 1000  WBC 5.1 6.1 4.8  HGB 13.1 13.3 11.8*  HCT 39.2 40.8 36.8*  PLT 169 210 220    COAGS: No results for input(s): INR, APTT in the last 8760 hours.  BMP:  Recent Labs  11/04/14 1635 12/27/14 0949 01/02/15 1530  NA 135 138  --   K 3.5 3.9  --   CL 103 103  --   CO2 26 28  --   GLUCOSE 102* 90  --   BUN 9 17  --   CALCIUM 8.7 9.0  --   CREATININE 1.29 1.26 1.22  GFRNONAA 67* 69* >60  GFRAA 78* 80* >60    LIVER FUNCTION TESTS:  Recent Labs  11/04/14 1635 12/27/14 0949  BILITOT 0.5 0.7  AST 26 23  ALT 18 18  ALKPHOS 52 50  PROT 9.4* 9.0*  ALBUMIN 3.6 3.7    TUMOR MARKERS: No results for input(s): AFPTM, CEA, CA199, CHROMGRNA in the last 8760 hours.  Assessment and Plan: Clanton KCLAYBORN MILNESis a 44y.o. male with history of newly diagnosed Hodgkin's lymphoma and recent imaging revealing multiple lytic lesions in the iliac bones and sacrum. He presents today for CT-guided left iliac bone lesion/bone marrow biopsy for further evaluation.Risks and benefits discussed with the patient including, but not limited to bleeding, infection, damage to adjacent structures or low yield requiring additional tests. All of the patient's questions were answered, patient is agreeable to proceed. Consent signed and in chart.      Signed: D. KRowe Robert5/24/2016, 10:26 AM   I spent a total of 20 minutes face to face in clinical consultation, greater than 50% of which was counseling/coordinating care for CT-guided left iliac bone/bone marrow biopsy .

## 2015-01-23 ENCOUNTER — Encounter (HOSPITAL_COMMUNITY): Payer: Self-pay

## 2015-01-23 ENCOUNTER — Encounter (HOSPITAL_BASED_OUTPATIENT_CLINIC_OR_DEPARTMENT_OTHER): Payer: Medicaid Other | Admitting: Hematology & Oncology

## 2015-01-23 ENCOUNTER — Encounter (HOSPITAL_COMMUNITY): Payer: Self-pay | Admitting: Hematology & Oncology

## 2015-01-23 VITALS — BP 126/72 | HR 84 | Temp 98.4°F | Resp 16 | Wt 165.7 lb

## 2015-01-23 DIAGNOSIS — D890 Polyclonal hypergammaglobulinemia: Secondary | ICD-10-CM

## 2015-01-23 DIAGNOSIS — R52 Pain, unspecified: Secondary | ICD-10-CM

## 2015-01-23 DIAGNOSIS — C8190 Hodgkin lymphoma, unspecified, unspecified site: Secondary | ICD-10-CM | POA: Diagnosis present

## 2015-01-23 DIAGNOSIS — M899 Disorder of bone, unspecified: Secondary | ICD-10-CM | POA: Diagnosis not present

## 2015-01-23 MED ORDER — HYDROCODONE-ACETAMINOPHEN 5-325 MG PO TABS: 1.0000 | ORAL_TABLET | ORAL | Status: DC | PRN

## 2015-01-23 NOTE — Progress Notes (Signed)
[place] [facility] [place] NOTE  Patient Care Team: No Pcp Per Patient as PCP - General (General Practice)  CHIEF COMPLAINTS/PURPOSE OF CONSULTATION:  Enlarged lymph nodes in pelvis Elevated Sed rate Abscess on right scrotum  L inquinal LN biopsy on 01/13/2015 with final pathology showing Hodgkin Lymphoma, nodular sclerosis Bone marrow biopsy 01/20/2015   HISTORY OF PRESENTING ILLNESS:  Robert Acevedo 44 y.o. male is here because of lymphadenopathy in the groin and abnormal CT imaging. He reports developing a knot in his left groin 2 years ago. He states it grew slowly over time. He did not seek medical care. He states he has had multiple dental issues and that he finally [facility]. He had a routine physical exam and the "knot" in his groin was noted. CT scan of the pelvis with contrast was performed on 12/20/2014 and shows adenopathy in the left groin involving the left external iliac chain largest lymph node measuring 41 x 22 mm. In addition multiple lytic lesions were noted in the iliac bones and sacrum with appearance consistent with multiple myeloma.   On periperal evaluation he had evidence of a polyclonal gammopathy, an abnormal kappa/lambda light chain ratio, elevated ESR at 45, normal CRP, normal CBC and normal LDH. 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  He is here today for additional discussion.  He is here alone today. He is upset by his diagnosis but notes " I will do what I have to do.". Discussed that his cancer is highly curable. He explains that he isn't necessarily worried about himself but more worried about his girlfriend. He notes they have been together for 10 years and she walked out a few days ago when he found out he had cancer.  MEDICAL HISTORY:  Past Medical History  Diagnosis Date  . Bronchitis   . Lymphadenopathy, inguinal     left      SURGICAL HISTORY: Past Surgical History  Procedure Laterality Date  . Wrist surgery      right  . Knee surgery    . Incision and drainage peritonsillar abscess    . Lymph node biopsy Left 01/13/2015    Procedure: LEFT INGUINAL LYMPH NODE BIOPSY;  Surgeon: Aviva Signs Md, MD;  Location: [facility];  Service: General;  Laterality: Left;  . Bone marrow biopsy Left 01/20/15  . Bone marrow aspiration Left 01/20/15    SOCIAL HISTORY: History   Social History  . Marital Status: Single    Spouse Name: N/A  . Number of Children: N/A  . Years of Education: N/A   Occupational History  . Not on file.   Social History Main Topics  . Smoking status: Current Every Day Smoker -- 0.50 packs/day for 2 years    Types: Cigarettes  . Smokeless tobacco: Never Used  . Alcohol Use: Yes     Comment: occasional  . Drug Use: Yes    Special: Marijuana     Comment: on occasion - last used 10/08/14  . Sexual Activity: Yes    Birth Control/ Protection: None   Other Topics Concern  . Not on file   Social History Narrative  Pt is a smoker starting 2008, pack of cigarettes a week. Infrequent drinking alcohol, maybe one or two once a month. Denies drug use other than daily marijuana. In jail for 18 months.  Has 3 kids, 71 yo, 12 yo,  and 80 yo. Has 1 grandchild. He has a car.    FAMILY HISTORY: Family History  Problem Relation Age of Onset  . Seizures Mother   . Diabetes Father   Father is 31 yo diabetic. Mother is 65 yo. Both still living. 1 brother, 4 sisters. One sister has breast cancer, diagnosed 1996 in her 38s. Another sister has severe depression. He has been with his girlfriend 9-10 years. He lives with his girlfriend and his father. He hasn't told his father about his diagnosis yet. One sister lives in [place] and she is aware of his diagnosis. He told two of his childhood friends about his diagnosis.   ALLERGIES:  is allergic to clindamycin/lincomycin.  MEDICATIONS:  Current  Outpatient Prescriptions  Medication Sig Dispense Refill  . HYDROcodone-acetaminophen (NORCO/VICODIN) 5-325 MG per tablet Take 1-2 tablets by mouth every 4 (four) hours as needed for severe pain. 40 tablet 0   No current facility-administered medications for this visit.    Review of Systems  Constitutional: Positive for weight loss and diaphoresis. Negative for fever, chills and malaise/fatigue.  HENT: Negative for congestion, ear discharge, ear pain, hearing loss, nosebleeds, sore throat and tinnitus.   Eyes: Positive for blurred vision. Negative for double vision, photophobia, pain, discharge and redness.  Respiratory: Negative.  Negative for stridor.   Cardiovascular: Negative.   Gastrointestinal: Negative for heartburn, nausea, vomiting, abdominal pain, diarrhea, blood in stool and melena.  Genitourinary: Negative for dysuria, frequency, hematuria and flank pain.  Musculoskeletal: Negative.   Skin: Negative.   Neurological: Negative for dizziness, tingling, tremors, sensory change, speech change, focal weakness, seizures, loss of consciousness and weakness.  Endo/Heme/Allergies: Negative.   Psychiatric/Behavioral: Positive for substance abuse. Negative for depression, suicidal ideas, hallucinations and memory loss. The patient has insomnia. The patient is not nervous/anxious.     14 point ROS was done and is otherwise as detailed above or in HPI   PHYSICAL EXAMINATION:  ECOG PERFORMANCE STATUS: 1 - Symptomatic but completely ambulatory  Filed Vitals:   01/23/15 1049  BP: 126/72  Pulse: 84  Temp: 98.4 F (36.9 C)  Resp: 16   Filed Weights   01/23/15 1049  Weight: 165 lb 11.2 oz (75.161 kg)   Physical Exam  Constitutional: He is oriented to person, place, and time and well-developed, well-nourished, and in no distress.  HENT:  Head: Normocephalic and atraumatic.  Nose: Nose normal.  Mouth/Throat: Oropharynx is clear and moist. No oropharyngeal exudate.  Eyes:  Conjunctivae and EOM are normal. Pupils are equal, round, and reactive to light. Right eye exhibits no discharge. Left eye exhibits no discharge. No scleral icterus.  Neck: Normal range of motion. Neck supple. No tracheal deviation present. No thyromegaly present.  Cardiovascular: Normal rate, regular rhythm and normal heart sounds.  Exam reveals no gallop and no friction rub.   No murmur heard. Pulmonary/Chest: Effort normal and breath sounds normal. He has no wheezes. He has no rales.  Abdominal: Soft. Bowel sounds are normal. He exhibits no distension and no mass. There is no tenderness. There is no rebound and no guarding.  Genitourinary:   Musculoskeletal: Normal range of motion. He exhibits no edema.   Lymphadenopathy:       Head (right side): No submental, no submandibular, no tonsillar, no preauricular, no posterior auricular and no occipital adenopathy present.       Head (left side): No submental, no submandibular, no tonsillar, no preauricular, no posterior auricular and no occipital adenopathy present.    He  has no cervical adenopathy.       Right cervical: No superficial cervical adenopathy present.      Left cervical: No superficial cervical adenopathy present.       Right axillary: No pectoral and no lateral adenopathy present.       Left axillary: No pectoral and no lateral adenopathy present.      Right: No inguinal adenopathy present.       Left: Inguinal adenopathy present.  Surgical incision site with some mild swelling. No redness or drainage. Neurological: He is alert and oriented to person, place, and time. He has normal reflexes. No cranial nerve deficit. Gait normal. Coordination normal.  Skin: Skin is warm and dry. No rash noted.  Psychiatric: Mood, memory, affect and judgment normal.  Nursing note and vitals reviewed.   LABORATORY DATA:  I have reviewed the data as listed Lab Results  Component Value Date   WBC 4.8 01/21/2015   HGB 11.8* 01/21/2015   HCT  36.8* 01/21/2015   MCV 88.5 01/21/2015   PLT 220 01/21/2015   Results for CESAREO, VICKREY (MRN [id number]) as of 01/03/2015 12:45  Ref. Range 12/27/2014 09:49 12/27/2014 09:50 01/02/2015 15:30 01/02/2015 16:08  Sodium Latest Ref Range: 135-145 mmol/L 138     Potassium Latest Ref Range: 3.5-5.1 mmol/L 3.9     Chloride Latest Ref Range: 96-112 mmol/L 103     CO2 Latest Ref Range: 19-32 mmol/L 28     BUN Latest Ref Range: 6-23 mg/dL 17     Creatinine Latest Ref Range: 0.61-1.24 mg/dL 1.26  1.22   Calcium Latest Ref Range: 8.4-10.5 mg/dL 9.0     EGFR (Non-African Amer.) Latest Ref Range: >60 mL/min 69 (L)  >60   EGFR (African American) Latest Ref Range: >60 mL/min 80 (L)  >60   Glucose Latest Ref Range: 70-99 mg/dL 90     Anion gap Latest Ref Range: 5-15  7     Alkaline Phosphatase Latest Ref Range: 39-117 U/L 50     Albumin Latest Ref Range: 3.5-5.2 g/dL 3.7     AST Latest Ref Range: 0-37 U/L 23     ALT Latest Ref Range: 0-53 U/L 18     Total Protein Latest Ref Range: 6.0-8.3 g/dL 9.0 (H)     Total Bilirubin Latest Ref Range: 0.3-1.2 mg/dL 0.7     LDH Latest Ref Range: 94-250 U/L 153     Beta-2 Microglobulin Latest Ref Range: 0.6-2.4 mg/L 2.5 (H)     CRP Latest Ref Range: <0.60 mg/dL <0.5 (L)     Total Protein ELP Latest Ref Range: 6.0-8.5 g/dL  8.7 (H)    Albumin SerPl Elph-Mcnc Latest Ref Range: 3.2-5.6 g/dL  3.5    Albumin/Glob SerPl Latest Ref Range: 0.7-2.0   0.7    Alpha2 Glob SerPl Elph-Mcnc Latest Ref Range: 0.4-1.2 g/dL  0.8    Alpha 1 Latest Ref Range: 0.1-0.4 g/dL  0.2    Gamma Glob SerPl Elph-Mcnc Latest Ref Range: 0.5-1.6 g/dL  3.2 (H)    IFE 1 Unknown  Comment    Globulin, Total Latest Ref Range: 2.0-4.5 g/dL  5.2 (H)    B-Globulin SerPl Elph-Mcnc Latest Ref Range: 0.6-1.3 g/dL  1.0    IgG (Immunoglobin G), Serum Latest Ref Range: 5635107516 mg/dL  3323 (H)    IgA Latest Ref Range: 90-386 mg/dL  380    IgM, Serum Latest Ref Range: 20-172 mg/dL  199 (H)    Kappa free light  chain Latest  Ref Range: 3.30-19.40 mg/L 77.82 (H)     Lamda free light chains Latest Ref Range: 5.71-26.30 mg/L 39.96 (H)     Kappa, lamda light chain ratio Latest Ref Range: 0.26-1.65  1.95 (H)     M Protein SerPl Elph-Mcnc Latest Ref Range: Not Observed g/dL  Not Observed    WBC Latest Ref Range: 4.0-10.5 K/uL 6.1     RBC Latest Ref Range: 4.22-5.81 MIL/uL 4.55     Hemoglobin Latest Ref Range: 13.0-17.0 g/dL 13.3     HCT Latest Ref Range: 39.0-52.0 % 40.8     MCV Latest Ref Range: 78.0-100.0 fL 89.7     MCH Latest Ref Range: 26.0-34.0 pg 29.2     MCHC Latest Ref Range: 30.0-36.0 g/dL 32.6     RDW Latest Ref Range: 11.5-15.5 % 14.4     Platelets Latest Ref Range: 150-400 K/uL 210     Neutrophils Latest Ref Range: 43-77 % 38 (L)     Lymphocytes Latest Ref Range: 12-46 % 46     Monocytes Relative Latest Ref Range: 3-12 % 13 (H)     Eosinophil Latest Ref Range: 0-5 % 3     Basophil Latest Ref Range: 0-1 % 1     NEUT# Latest Ref Range: 1.7-7.7 K/uL 2.3     Lymphocyte # Latest Ref Range: 0.7-4.0 K/uL 2.8     Monocyte # Latest Ref Range: 0.1-1.0 K/uL 0.8     Eosinophils Absolute Latest Ref Range: 0.0-0.7 K/uL 0.2     Basophils Absolute Latest Ref Range: 0.0-0.1 K/uL 0.0     Sed Rate Latest Ref Range: 0-16 mm/hr 45 (H)     TSH Latest Ref Range: 0.350-4.500 uIU/mL   0.492   T4, Total Latest Ref Range: 4.5-12.0 ug/dL   5.0   Please Note (HCV): Unknown  Comment    Angiotensin-Converting Enzyme Latest Ref Range: 14-82 U/L   27   Protein, Urine Latest Units: mg/dL    13  Creatinine, Urine Latest Units: mg/dL    194.11  Urine Total Volume-CRCL Latest Units: mL    2125  Collection Interval-CRCL Latest Units: hours    24  Creatinine, 24H Ur Latest Ref Range: 864-462-4456 mg/day    4125 (H)  Creatinine Clearance Latest Ref Range: 75-125 mL/min    235 (H)  Urine Total Volume-UPROT Latest Units: mL    2125  Collection Interval-UPROT Latest Units: hours    24  Protein, 24H Urine Latest Ref Range:  50-100 mg/day    276 (H)    PATHOLOGY:  BMBXIAGNOSIS Diagnosis Bone Marrow, Aspirate,Biopsy, and Clot, left iliac - NORMOCELLULAR BONE MARROW FOR AGE WITH TRILINEAGE HEMATOPOIESIS. - MILD POLYCLONAL PLASMACYTOSIS - NO TUMOR IDENTIFIED. PERIPHERAL BLOOD: - NORMOCYTIC-NORMOCHROMIC ANEMIA. Diagnosis Note There is no morphologic evidence of involvement by Hodgkin lymphoma in this material. Clinical correlation is recommended. (BNS:ecj 01/22/2015) Susanne Greenhouse MD Pathologist, Electronic Signature  LYMPH NODE BIOPSY Diagnosis Lymph node for lymphoma, left inguinal - CLASSICAL HODGKIN LYMPHOMA, NODULAR SCLEROSIS TYPE. - SEE ONCOLOGY TABLE. Microscopic Comment LYMPHOMA Histologic type: Classical Hodgkin lymphoma, nodular sclerosis type. Grade (if applicable): N/A. Flow cytometry: Not performed. Immunohistochemical stains: CD45, CD20, CD79a, CD3, CD30, and CD15. Touch preps/imprints: Mixed lymphoid population with scattered large cells with bi-/multi-nucleation and prominent nucleoli. Comments: Sections of lymph nodes reveal effacement of the architecture by a nodular lymphoreticular proliferation. There are abundant large cells with bi-/multi-nucleation and prominent nucleoli, consistent with Hodgkin Reed-Sternberg (HRS) cells. These occur both scattered and in large loose clusters.  There are lacunar variants and mummified cells. Focally, there are broad bands of fibrosis creating a more nodular appearance. In these areas the background reveals small lymphocytes and plasma cells with scattered eosinophils and neutrophils, while less fibrotic areas have a more lymphocytic background. The smaller node is only partially involved with areas of residual reactive lymph node. Immunohistochemistry reveals the HRS cells are positive for CD30 and CD15. They are negative for CD20, CD79a, and CD45. CD20 and CD79 highlight residual B-cell areas. CD3 highlights abundant T-cells. The overall  findings are consistent with classical Hodgkin lymphoma, nodular sclerosis type. The case was called to Drs. Jenkins and [place] on 01/15/2015. Vicente Males MD Pathologist, Electronic Signature (Case signed 01/15/2015) Specimen Gross and Clinical Information   RADIOGRAPHIC STUDIES:   CLINICAL DATA: Left groin mass for 18 months.  EXAM: CT PELVIS WITH CONTRAST  TECHNIQUE: Multidetector CT imaging of the pelvis was performed using the standard protocol following the bolus administration of intravenous contrast.  CONTRAST: 128m OMNIPAQUE IOHEXOL 300 MG/ML SOLN  COMPARISON: Ultrasound dated 12/18/2014  FINDINGS: There are multiple enlarged left inguinal lymph nodes. The largest node measures 41 x 22 mm on the coronal image. A slightly more inferior and medial node measures 23 x 16 mm and an external iliac node that measures 17 x 11 mm on the sagittal images.  There are multiple small nodes in the right inguinal region. No other discrete adenopathy is visible in the pelvis.  The patient has numerous lytic lesions in the iliac bones bilaterally some involving the cortex. The largest lesion is in the left ilium measuring 22 mm on the coronal image. There are 2 lytic lesions in the sacrum. Proximal femurs appear normal.  IMPRESSION: 1. Adenopathy in the left groin and involving the left external iliac chain. The adenopathy is nonspecific. 2. Multiple lytic lesions in the iliac bones and sacrum. This appearance is most consistent with multiple myeloma.   Electronically Signed  By: JLorriane ShireM.D.  On: 12/20/2014 22:50   I have personally reviewed the radiological images as listed and agreed with the findings in the report.  ASSESSMENT & PLAN:  Abnormal CT of the pelvis with lytic bone disease Lymphadenopathy L groin Polyclonal gammopathy Elevated ESR Hodgkin Lymphoma, Nodular sclerosing type   I discussed Hodgkin lymphoma in detail with the  patient. He was given reading information. Advised him we still have several issues to address, he will need PET imaging to complete staging. He will need port placement, a MUGA, and chemotherapy teaching. We reviewed the results of his biopsy and bone marrow biopsy. We discussed the typical course of treatment for Hodgkin lymphoma. I encouraged him that this is typically a curable disease.  We discussed his relationships, he lives with his father who he is close to. He is having difficulty currently accepting his girlfriends decision to leave.   He met both TGershon Musseland HHildred Alaminout patient navigator today.  He will follow-up with me prior to initiation of cycle #1 of ABVD. Dr. JArnoldo Moralewill place his port next Friday.  Orders Placed This Encounter  Procedures  . NM PET Image Initial (PI) Skull Base To Thigh    Standing Status: Future     Number of Occurrences:      Standing Expiration Date: 01/23/2016    Order Specific Question:  Reason for Exam (SYMPTOM  OR DIAGNOSIS REQUIRED)    Answer:  staging for hodgkin lymphoma    Order Specific Question:  Preferred imaging location?    Answer:  [facility]    Standing Status: Future     Number of Occurrences:      Standing Expiration Date: 01/23/2016    Order Specific Question:  Reason for Exam (SYMPTOM  OR DIAGNOSIS REQUIRED)    Answer:  high risk medication    Order Specific Question:  Preferred imaging location?    Answer:  [facility]    All questions were answered. The patient knows to call the clinic with any problems, questions or concerns.  This note was electronically signed.    This document serves as a record of services personally performed by Ancil Linsey, MD. It was created on her behalf by Arlyce Harman, a trained medical scribe. The creation of this record is based on the scribe's personal observations and the provider's statements to them. This document has been checked and approved by the  attending provider.  I have reviewed the above documentation for accuracy and completeness, and I agree with the above.   Ancil Linsey, MD

## 2015-01-23 NOTE — Patient Instructions (Addendum)
[place] at [facility] Discharge Instructions  RECOMMENDATIONS MADE BY THE CONSULTANT AND ANY TEST RESULTS WILL BE SENT TO YOUR REFERRING PHYSICIAN.  Exam and discussion by Dr. Whitney Muse. Will need to get some additional studies done. PET Scan 01/28/15 at [facility] in Radiology.  Need to arrive at 6:45 am.  Nothing to eat or drink 6 hours prior.  No chewing gum, hard candy, mints, etc. MUGA on 01/29/15 at [facility] in Radiology.  Need to arrive at 12:45PM Will need chemotherapy teaching by Lupita Raider and she will contact you with day and time. Port a cath placement by Dr. Arnoldo Morale  Follow-up in 1 week.  Thank you for choosing [place] at [facility] to provide your oncology and hematology care.  To afford each patient quality time with our provider, please arrive at least 15 minutes before your scheduled appointment time.    You need to re-schedule your appointment should you arrive 10 or more minutes late.  We strive to give you quality time with our providers, and arriving late affects you and other patients whose appointments are after yours.  Also, if you no show three or more times for appointments you may be dismissed from the clinic at the providers discretion.     Again, thank you for choosing [facility].  Our hope is that these requests will decrease the amount of time that you wait before being seen by our physicians.       _____________________________________________________________  Should you have questions after your visit to [facility], please contact our office at [phone] between the hours of 8:30 a.m. and 4:30 p.m.  Voicemails left after 4:30 p.m. will not be returned until the following business day.  For prescription refill requests, have your pharmacy contact our office.

## 2015-01-24 ENCOUNTER — Other Ambulatory Visit (HOSPITAL_COMMUNITY): Payer: Self-pay | Admitting: Oncology

## 2015-01-24 ENCOUNTER — Encounter: Payer: Self-pay | Admitting: Dietician

## 2015-01-24 ENCOUNTER — Telehealth (HOSPITAL_COMMUNITY): Payer: Self-pay | Admitting: *Deleted

## 2015-01-24 MED ORDER — LORAZEPAM 0.5 MG PO TABS: 0.5000 mg | ORAL_TABLET | Freq: Three times a day (TID) | ORAL | Status: DC | PRN

## 2015-01-24 MED ORDER — LIDOCAINE-PRILOCAINE 2.5-2.5 % EX CREA: TOPICAL_CREAM | CUTANEOUS | Status: DC

## 2015-01-24 MED ORDER — ONDANSETRON HCL 8 MG PO TABS: 8.0000 mg | ORAL_TABLET | Freq: Three times a day (TID) | ORAL | Status: DC | PRN

## 2015-01-24 MED ORDER — PROCHLORPERAZINE MALEATE 10 MG PO TABS: 10.0000 mg | ORAL_TABLET | Freq: Four times a day (QID) | ORAL | Status: DC | PRN

## 2015-01-24 NOTE — Telephone Encounter (Signed)
Done.  Ativan is called in

## 2015-01-24 NOTE — H&P (Signed)
Robert Acevedo is an 44 y.o. male.   Chief Complaint: Lymphoma, need for central venous access HPI: Patient is a 45 year old black male recently diagnosed with lymphoma who now presents for a Port-A-Cath insertion. He is about to undergo chemotherapy and needs central venous access.  Past Medical History  Diagnosis Date  . Bronchitis   . Lymphadenopathy, inguinal     left    Past Surgical History  Procedure Laterality Date  . Wrist surgery      right  . Knee surgery    . Incision and drainage peritonsillar abscess    . Lymph node biopsy Left 01/13/2015    Procedure: LEFT INGUINAL LYMPH NODE BIOPSY;  Surgeon: Aviva Signs Md, MD;  Location: [facility];  Service: General;  Laterality: Left;  . Bone marrow biopsy Left 01/20/15  . Bone marrow aspiration Left 01/20/15    Family History  Problem Relation Age of Onset  . Seizures Mother   . Diabetes Father    Social History:  reports that he has been smoking Cigarettes.  He has a 1 pack-year smoking history. He has never used smokeless tobacco. He reports that he drinks alcohol. He reports that he uses illicit drugs (Marijuana).  Allergies:  Allergies  Allergen Reactions  . Clindamycin/Lincomycin Rash    No prescriptions prior to admission    No results found for this or any previous visit (from the past 48 hour(s)). No results found.  Review of Systems  Constitutional: Positive for fever.  Respiratory: Negative.   Cardiovascular: Negative.   Gastrointestinal: Negative.   Genitourinary: Negative.   Musculoskeletal: Negative.   Skin: Negative.     There were no vitals taken for this visit. Physical Exam  Constitutional: He is oriented to person, place, and time. He appears well-developed and well-nourished.  HENT:  Head: Normocephalic and atraumatic.  Neck: Normal range of motion. Neck supple.  Cardiovascular: Normal rate, regular rhythm and normal heart sounds.   Respiratory: Effort normal and breath sounds normal.   GI: Soft.  Well-healed left groin surgical incision.  Neurological: He is alert and oriented to person, place, and time.  Skin: Skin is warm.     Assessment/Plan Impression: Lymphoma Plan: Patient scheduled undergo Port-A-Cath insertion on 01/31/2015. Risks and benefits of the procedure including bleeding, infection, and pneumothorax were fully explained to the patient, who gave informed consent.  Renne Platts A 01/24/2015, 12:14 PM

## 2015-01-24 NOTE — Patient Instructions (Signed)
Duward ZACKERIAH KISSLER  01/24/2015     @PREFPERIOPPHARMACY @   Your procedure is scheduled on  01/31/2015  Report to [facility] at  615  A.M.  Call this number if you have problems the morning of surgery:  [phone]   Remember:  Do not eat food or drink liquids after midnight.  Take these medicines the morning of surgery with A SIP OF WATER  Hydrocodone if needed.   Do not wear jewelry, make-up or nail polish.  Do not wear lotions, powders, or perfumes.    Do not shave 48 hours prior to surgery.  Men may shave face and neck.  Do not bring valuables to the hospital.  [facility] is not responsible for any belongings or valuables.  Contacts, dentures or bridgework may not be worn into surgery.  Leave your suitcase in the car.  After surgery it may be brought to your room.  For patients admitted to the hospital, discharge time will be determined by your treatment team.  Patients discharged the day of surgery will not be allowed to drive home.   Name and phone number of your driver:   family Special instructions:  none  Please read over the following fact sheets that you were given. Pain Booklet, Coughing and Deep Breathing, Surgical Site Infection Prevention, Anesthesia Post-op Instructions and Care and Recovery After Surgery      Implanted Port Insertion An implanted port is a central line that has a round shape and is placed under the skin. It is used as a long-term IV access for:   Medicines, such as chemotherapy.   Fluids.   Liquid nutrition, such as total parenteral nutrition (TPN).   Blood samples.  LET [facility] CARE PROVIDER KNOW ABOUT:  Allergies to food or medicine.   Medicines taken, including vitamins, herbs, eye drops, creams, and over-the-counter medicines.   Any allergies to heparin.  Use of steroids (by mouth or creams).   Previous problems with anesthetics or numbing medicines.   History of bleeding problems or blood clots.    Previous surgery.   Other health problems, including diabetes and kidney problems.   Possibility of pregnancy, if this applies. RISKS AND COMPLICATIONS Generally, this is a safe procedure. However, as with any procedure, problems can occur. Possible problems include:  Damage to the blood vessel, bruising, or bleeding at the puncture site.   Infection.  Blood clot in the vessel that the port is in.  Breakdown of the skin over your port.  Very rarely a person may develop a condition called a pneumothorax, a collection of air in the chest that may cause one of the lungs to collapse. The placement of these catheters with the appropriate imaging guidance significantly decreases the risk of a pneumothorax.  BEFORE THE PROCEDURE   Your health care provider may want you to have blood tests. These tests can help tell how well your kidneys and liver are working. They can also show how well your blood clots.   If you take blood thinners (anticoagulant medicines), ask your health care provider when you should stop taking them.   Make arrangements for someone to drive you home. This is necessary if you have been sedated for your procedure.  PROCEDURE  Port insertion usually takes about 30-45 minutes.   An IV needle will be inserted in your arm. Medicine for pain and medicine to help relax you (sedative) will flow directly into your body through this needle.  You will lie on an exam table, and you will be connected to monitors to keep track of your heart rate, blood pressure, and breathing throughout the procedure.  An oxygen monitoring device may be attached to your finger. Oxygen will be given.   Everything will be kept as germ free (sterile) as possible during the procedure. The skin near the point of the incision will be cleansed with antiseptic, and the area will be draped with sterile towels. The skin and deeper tissues over the port area will be made numb with a local  anesthetic.  Two small cuts (incisions) will be made in the skin to insert the port. One will be made in the neck to obtain access to the vein where the catheter will lie.   Because the port reservoir will be placed under the skin, a small skin incision will be made in the upper chest, and a small pocket for the port will be made under the skin. The catheter that will be connected to the port tunnels to a large central vein in the chest. A small, raised area will remain on your body at the site of the reservoir when the procedure is complete.  The port placement will be done under imaging guidance to ensure the proper placement.  The reservoir has a silicone covering that can be punctured with a special needle.   The port will be flushed with normal saline, and blood will be drawn to make sure it is working properly.  There will be nothing remaining outside the skin when the procedure is finished.   Incisions will be held together by stitches, surgical glue, or a special tape. AFTER THE PROCEDURE  You will stay in a recovery area until the anesthesia has worn off. Your blood pressure and pulse will be checked.  A final chest X-ray will be taken to check the placement of the port and to ensure that there is no injury to your lung. Document Released: 06/06/2013 Document Revised: 12/31/2013 Document Reviewed: 06/06/2013 [facility] Patient Information 2015 [place], [place]. This information is not intended to replace advice given to you by your health care provider. Make sure you discuss any questions you have with your health care provider. PATIENT INSTRUCTIONS POST-ANESTHESIA  IMMEDIATELY FOLLOWING SURGERY:  Do not drive or operate machinery for the first twenty four hours after surgery.  Do not make any important decisions for twenty four hours after surgery or while taking narcotic pain medications or sedatives.  If you develop intractable nausea and vomiting or a severe headache please  notify your doctor immediately.  FOLLOW-UP:  Please make an appointment with your surgeon as instructed. You do not need to follow up with anesthesia unless specifically instructed to do so.  WOUND CARE INSTRUCTIONS (if applicable):  Keep a dry clean dressing on the anesthesia/puncture wound site if there is drainage.  Once the wound has quit draining you may leave it open to air.  Generally you should leave the bandage intact for twenty four hours unless there is drainage.  If the epidural site drains for more than 36-48 hours please call the anesthesia department.  QUESTIONS?:  Please feel free to call your physician or the hospital operator if you have any questions, and they will be happy to assist you.

## 2015-01-24 NOTE — Telephone Encounter (Signed)
Patient notified regarding ativan.  Instructed that he should not drive while taking it nor can he drink alcohol while taking.  Verbalized understanding.

## 2015-01-24 NOTE — Progress Notes (Signed)
Patient identified to be at risk for malnutrition on the MST secondary to Poor PO intake and weight loss  Contacted Pt by Phone   Wt Readings from Last 10 Encounters:  01/23/15 165 lb 11.2 oz (75.161 kg)  01/02/15 173 lb 8 oz (78.699 kg)  11/04/14 189 lb (85.73 kg)  10/11/14 183 lb (83.008 kg)  10/09/14 183 lb (83.008 kg)  12/08/13 185 lb (83.915 kg)  03/12/13 185 lb (83.915 kg)  11/05/12 185 lb (83.915 kg)  08/07/12 195 lb (88.451 kg)  07/15/12 195 lb (88.451 kg)  Patient weight has Decreased by 20 lbs in 2.5 months  Patient reports oral intake as Poor and is suffering from symptoms including loss of appetite and fatigue  Pt states that he hasnt had much of an appetite recently. He only eats about 1-2 meals a day. He was surprised that he had lost ~20 lbs; he thought it was less. He denies any n/v/c/d or changes in taste/smell at this time.   I asked if he had been supplementing his intake with any supplements. He states he has tried Ensure/Boost, but they upset his stomach. Recommend trying Glucerna/protein bars.   We discussed the idea that foods he should be eating should be high in calories and protein. I mentioned eggs, whole milk, cottage cheese, meats, PB. He was eager to get a list of foods he should be eating.   Pt was very receptive to what I had to say. Will F/U at his next appt  Mailed my contact info, coupons, and handouts titled "Increasing Protein and Calories" and "Soft and Moist High Protein Menu Ideas"   Burtis Junes RD, LDN Nutrition Pager: [phone] 01/24/2015 11:23 AM

## 2015-01-27 ENCOUNTER — Emergency Department (HOSPITAL_COMMUNITY): Payer: Self-pay

## 2015-01-27 ENCOUNTER — Emergency Department (HOSPITAL_COMMUNITY)
Admission: EM | Admit: 2015-01-27 | Discharge: 2015-01-27 | Disposition: A | Discharge status: Home / Self Care | Payer: Self-pay | Attending: Emergency Medicine | Admitting: Emergency Medicine

## 2015-01-27 ENCOUNTER — Encounter (HOSPITAL_COMMUNITY): Payer: Self-pay | Admitting: Emergency Medicine

## 2015-01-27 DIAGNOSIS — Z8709 Personal history of other diseases of the respiratory system: Secondary | ICD-10-CM | POA: Insufficient documentation

## 2015-01-27 DIAGNOSIS — Z720 Tobacco use: Secondary | ICD-10-CM | POA: Insufficient documentation

## 2015-01-27 DIAGNOSIS — H539 Unspecified visual disturbance: Secondary | ICD-10-CM

## 2015-01-27 DIAGNOSIS — C8190 Hodgkin lymphoma, unspecified, unspecified site: Secondary | ICD-10-CM | POA: Insufficient documentation

## 2015-01-27 DIAGNOSIS — H538 Other visual disturbances: Secondary | ICD-10-CM | POA: Insufficient documentation

## 2015-01-27 HISTORY — DX: Hodgkin lymphoma, unspecified, unspecified site: C81.90

## 2015-01-27 LAB — COMPREHENSIVE METABOLIC PANEL
ALK PHOS: 49 U/L (ref 38–126)
ALT: 17 U/L (ref 17–63)
AST: 20 U/L (ref 15–41)
Albumin: 3.6 g/dL (ref 3.5–5.0)
Anion gap: 8 (ref 5–15)
BILIRUBIN TOTAL: 0.6 mg/dL (ref 0.3–1.2)
BUN: 13 mg/dL (ref 6–20)
CALCIUM: 9 mg/dL (ref 8.9–10.3)
CO2: 30 mmol/L (ref 22–32)
Chloride: 98 mmol/L — ABNORMAL LOW (ref 101–111)
Creatinine, Ser: 1.07 mg/dL (ref 0.61–1.24)
Glucose, Bld: 83 mg/dL (ref 65–99)
Potassium: 3.7 mmol/L (ref 3.5–5.1)
Sodium: 136 mmol/L (ref 135–145)
TOTAL PROTEIN: 9.4 g/dL — AB (ref 6.5–8.1)

## 2015-01-27 LAB — CBC WITH DIFFERENTIAL/PLATELET
Basophils Absolute: 0 10*3/uL (ref 0.0–0.1)
Basophils Relative: 1 % (ref 0–1)
EOS ABS: 0.1 10*3/uL (ref 0.0–0.7)
Eosinophils Relative: 1 % (ref 0–5)
HCT: 39.4 % (ref 39.0–52.0)
HEMOGLOBIN: 12.9 g/dL — AB (ref 13.0–17.0)
Lymphocytes Relative: 34 % (ref 12–46)
Lymphs Abs: 2.2 10*3/uL (ref 0.7–4.0)
MCH: 29.1 pg (ref 26.0–34.0)
MCHC: 32.7 g/dL (ref 30.0–36.0)
MCV: 88.7 fL (ref 78.0–100.0)
MONO ABS: 0.8 10*3/uL (ref 0.1–1.0)
Monocytes Relative: 13 % — ABNORMAL HIGH (ref 3–12)
Neutro Abs: 3.2 10*3/uL (ref 1.7–7.7)
Neutrophils Relative %: 51 % (ref 43–77)
PLATELETS: 226 10*3/uL (ref 150–400)
RBC: 4.44 MIL/uL (ref 4.22–5.81)
RDW: 14.2 % (ref 11.5–15.5)
WBC: 6.3 10*3/uL (ref 4.0–10.5)

## 2015-01-27 LAB — CBG MONITORING, ED: Glucose-Capillary: 76 mg/dL (ref 65–99)

## 2015-01-27 MED ORDER — KETOROLAC TROMETHAMINE 0.5 % OP SOLN: 1.0000 [drp] | Freq: Four times a day (QID) | OPHTHALMIC | Status: DC

## 2015-01-27 NOTE — Progress Notes (Signed)
This encounter was created in error - please disregard.

## 2015-01-27 NOTE — ED Notes (Signed)
Pt states that he has been having blurred vision in both eyes since yesterday morning.

## 2015-01-27 NOTE — Discharge Instructions (Signed)
Your labs, CT head and CT orbits are negative for acute problem. The pressure in the left eye is slightly above normal at 23. The pressure in the right eye is normal at 15. Please use use acular drops for possible inflammation in the eyes. Please see Dr Iona Hansen, or the ophthalmologist of your choice tomorrow or as soon as possible. Blurred Vision You have been seen today complaining of blurred vision. This means you have a loss of ability to see small details.  CAUSES  Blurred vision can be a symptom of underlying eye problems, such as:  Aging of the eye (presbyopia).  Glaucoma.  Cataracts.  Eye infection.  Eye-related migraine.  Diabetes mellitus.  Fatigue.  Migraine headaches.  High blood pressure.  Breakdown of the back of the eye (macular degeneration).  Problems caused by some medications. The most common cause of blurred vision is the need for eyeglasses or a new prescription. Today in the emergency department, no cause for your blurred vision can be found. SYMPTOMS  Blurred vision is the loss of visual sharpness and detail (acuity). DIAGNOSIS  Should blurred vision continue, you should see your caregiver. If your caregiver is your primary care physician, he or she may choose to refer you to another specialist.  TREATMENT  Do not ignore your blurred vision. Make sure to have it checked out to see if further treatment or referral is necessary. SEEK MEDICAL CARE IF:  You are unable to get into a specialist so we can help you with a referral. SEEK IMMEDIATE MEDICAL CARE IF: You have severe eye pain, severe headache, or sudden loss of vision. MAKE SURE YOU:   Understand these instructions.  Will watch your condition.  Will get help right away if you are not doing well or get worse. Document Released: 08/19/2003 Document Revised: 11/08/2011 Document Reviewed: 03/20/2008 [facility] Patient Information 2015 [place], [place]. This information is not intended to replace advice  given to you by your health care provider. Make sure you discuss any questions you have with your health care provider.

## 2015-01-27 NOTE — ED Provider Notes (Signed)
CSN: [id number]     Arrival date & time 01/27/15  0810 History   First MD Initiated Contact with Patient 01/27/15 [phone]     Chief Complaint  Patient presents with  . Eye Problem     (Consider location/radiation/quality/duration/timing/severity/associated sxs/prior Treatment) HPI Comments: Patient is a 44 year old male who presents to the emergency department with complaint of blurred vision in both eyes. The patient states this problem started on yesterday morning, May 29. He states that everything seems to be fuzzy, with blurred appearance. He states that he can see fingers at arms length, but cannot necessarily make out the finger nail or nailbed. He states that he cannot read letters or sentences on his cell phone which is new for him. He denies any recent injury or trauma. It is of note that the patient was recently diagnosed with Hodgkin's lymphoma. He is to begin treatments next week. There's been no operations procedures involving the head or the eyes. The patient does not use contact lens. He denies any double vision. His been no foreign body to the eye. The patient does not have a known history of diabetes. Patient states he uses alcohol and marijuana, but neither in excess.  Patient is a 44 y.o. male presenting with eye problem. The history is provided by the patient.  Eye Problem Location:  Both Associated symptoms: no photophobia     Past Medical History  Diagnosis Date  . Bronchitis   . Lymphadenopathy, inguinal     left  . Hodgkin's lymphoma    Past Surgical History  Procedure Laterality Date  . Wrist surgery      right  . Knee surgery    . Incision and drainage peritonsillar abscess    . Lymph node biopsy Left 01/13/2015    Procedure: LEFT INGUINAL LYMPH NODE BIOPSY;  Surgeon: Aviva Signs Md, MD;  Location: [facility];  Service: General;  Laterality: Left;  . Bone marrow biopsy Left 01/20/15  . Bone marrow aspiration Left 01/20/15   Family History  Problem Relation Age of  Onset  . Seizures Mother   . Diabetes Father    History  Substance Use Topics  . Smoking status: Current Every Day Smoker -- 0.50 packs/day for 2 years    Types: Cigarettes  . Smokeless tobacco: Never Used  . Alcohol Use: Yes     Comment: occasional    Review of Systems  Eyes: Positive for visual disturbance. Negative for photophobia.  Musculoskeletal: Positive for arthralgias.  All other systems reviewed and are negative.     Allergies  Clindamycin/lincomycin  Home Medications   Prior to Admission medications   Medication Sig Start Date End Date Taking? Authorizing Provider  HYDROcodone-acetaminophen (NORCO/VICODIN) 5-325 MG per tablet Take 1-2 tablets by mouth every 4 (four) hours as needed for severe pain. 01/23/15   Patrici Ranks, MD  lidocaine-prilocaine (EMLA) cream Apply a quarter size amount to port site 1 hour prior to chemo. Do not rub in. Cover with plastic wrap. 01/24/15   Patrici Ranks, MD  LORazepam (ATIVAN) 0.5 MG tablet Take 1 tablet (0.5 mg total) by mouth every 8 (eight) hours as needed for anxiety. 01/24/15   Baird Cancer, PA-C  ondansetron (ZOFRAN) 8 MG tablet Take 1 tablet (8 mg total) by mouth every 8 (eight) hours as needed for nausea or vomiting. 01/24/15   Patrici Ranks, MD  prochlorperazine (COMPAZINE) 10 MG tablet Take 1 tablet (10 mg total) by mouth every 6 (six) hours as  needed for nausea or vomiting. 01/24/15   Patrici Ranks, MD   BP 135/83 mmHg  Pulse 70  Temp(Src) 97.4 F (36.3 C) (Oral)  Resp 18  Ht _0  (1.753 m)  Wt 165 lb (74.844 kg)  BMI 24.36 kg/m2  SpO2 100% Physical Exam  Constitutional: He is oriented to person, place, and time. He appears well-developed and well-nourished.  Non-toxic appearance.  HENT:  Head: Normocephalic.  Right Ear: Tympanic membrane and external ear normal.  Left Ear: Tympanic membrane and external ear normal.  Eyes: EOM and lids are normal.  There is no proptosis of the right or left eye.  The left pupil is small and irregular. The right pupil is dilated approximately 3 mm. Neither pupil is reactive to light. Extra Movements are intact. The conjunctiva is clear.  On funduscopic examination is no exudate appreciated. No hemorrhage noted. Disc on the right is sharp and flat, visualization of the disc on the left is limited.  Neck: Normal range of motion. Neck supple. Carotid bruit is not present.  No carotid bruits appreciated. Few small submental lymph nodes palpated.  Cardiovascular: Normal rate, regular rhythm, normal heart sounds, intact distal pulses and normal pulses.   Pulmonary/Chest: Breath sounds normal. No respiratory distress.  Abdominal: Soft. Bowel sounds are normal. There is no tenderness. There is no guarding.  Musculoskeletal: Normal range of motion.  Lymphadenopathy:       Head (right side): No submandibular adenopathy present.       Head (left side): No submandibular adenopathy present.    He has no cervical adenopathy.  Neurological: He is alert and oriented to person, place, and time. He has normal strength. No cranial nerve deficit or sensory deficit. He exhibits normal muscle tone. Coordination normal.  There is no facial asymmetry. The patient able to raise the soft palate. Speech is clear and understandable. Grip is symmetrical. No motor deficits of the upper or lower extremities. No sensory deficits of the face, upper or lower extremities.  Skin: Skin is warm and dry.  Psychiatric: He has a normal mood and affect. His speech is normal.  Nursing note and vitals reviewed.   ED Course Patient seen with me by Dr. Thurnell Garbe   Procedures (including critical care time)  Intraocular pressures measured. Pressure in the left eye is 15. Pressure in the right eye is 23 slightly elevated. Labs Review Labs Reviewed  CBG MONITORING, ED    Imaging Review No results found.   EKG Interpretation None      MDM  Vital signs are well within normal limits. Pulse  oximetry is 99-100% on room air. CT head scan is negative for any bleed, stroke, or mass effect. CT of the orbit reveals the globes to be intact. No foreign body appreciated. No abnormality of the optic nerve. No retinal tear appreciated. Intraocular pressure on the left is 15, on the right is 23. The anterior chambers are clear bilaterally. There is a keyhole type configuration of the left pupil. The right pupil is dilated, and neither pupil is reactive. Case discussed with Dr. Yolanda Bonine (ophthalmology).   Dr. Yolanda Bonine who suggested the patient be seen by an ophthalmologist on tomorrow. No noted emergent changes or findings noted at this time. Patient is to return to the emergency department if any changes, problems, or concerns.    Final diagnoses:  None    *I have reviewed nursing notes, vital signs, and all appropriate lab and imaging results for this patient.**  Lily Kocher, PA-C 01/28/15 [place], DO 01/30/15 2010

## 2015-01-28 ENCOUNTER — Encounter (HOSPITAL_COMMUNITY)
Admission: RE | Admit: 2015-01-28 | Discharge: 2015-01-28 | Disposition: A | Discharge status: Home / Self Care | Payer: Self-pay | Source: Ambulatory Visit | Attending: General Surgery | Admitting: General Surgery

## 2015-01-28 ENCOUNTER — Encounter (HOSPITAL_COMMUNITY): Payer: Self-pay

## 2015-01-28 ENCOUNTER — Encounter (HOSPITAL_COMMUNITY)
Admission: RE | Admit: 2015-01-28 | Discharge: 2015-01-28 | Disposition: A | Discharge status: Home / Self Care | Payer: Self-pay | Source: Ambulatory Visit | Attending: Hematology & Oncology | Admitting: Hematology & Oncology

## 2015-01-28 DIAGNOSIS — C8190 Hodgkin lymphoma, unspecified, unspecified site: Secondary | ICD-10-CM | POA: Insufficient documentation

## 2015-01-28 LAB — GLUCOSE, CAPILLARY: Glucose-Capillary: 90 mg/dL (ref 65–99)

## 2015-01-28 MED ORDER — FLUDEOXYGLUCOSE F - 18 (FDG) INJECTION
8.0000 | Freq: Once | INTRAVENOUS | Status: AC | PRN
  Administered 2015-01-28: 8 via INTRAVENOUS

## 2015-01-29 ENCOUNTER — Encounter: Payer: Self-pay | Admitting: *Deleted

## 2015-01-29 ENCOUNTER — Encounter (HOSPITAL_COMMUNITY): Payer: Self-pay

## 2015-01-29 ENCOUNTER — Encounter (HOSPITAL_COMMUNITY)
Admission: RE | Admit: 2015-01-29 | Discharge: 2015-01-29 | Disposition: A | Discharge status: Home / Self Care | Payer: MEDICAID | Source: Ambulatory Visit | Attending: Hematology & Oncology | Admitting: Hematology & Oncology

## 2015-01-29 ENCOUNTER — Ambulatory Visit (HOSPITAL_COMMUNITY)
Admission: RE | Admit: 2015-01-29 | Discharge: 2015-01-29 | Disposition: A | Discharge status: Home / Self Care | Payer: MEDICAID | Source: Ambulatory Visit | Attending: Oncology | Admitting: Oncology

## 2015-01-29 DIAGNOSIS — R0609 Other forms of dyspnea: Secondary | ICD-10-CM | POA: Insufficient documentation

## 2015-01-29 DIAGNOSIS — F17210 Nicotine dependence, cigarettes, uncomplicated: Secondary | ICD-10-CM | POA: Insufficient documentation

## 2015-01-29 DIAGNOSIS — C8190 Hodgkin lymphoma, unspecified, unspecified site: Secondary | ICD-10-CM | POA: Insufficient documentation

## 2015-01-29 LAB — PULMONARY FUNCTION TEST
DL/VA % pred: 85 %
DL/VA: 3.94 ml/min/mmHg/L
DLCO unc % pred: 82 %
DLCO unc: 25.62 ml/min/mmHg
FEF 25-75 Post: 4.76 L/sec
FEF 25-75 Pre: 4.07 L/sec
FEF2575-%Change-Post: 16 %
FEF2575-%PRED-POST: 133 %
FEF2575-%Pred-Pre: 113 %
FEV1-%Change-Post: 3 %
FEV1-%Pred-Post: 99 %
FEV1-%Pred-Pre: 96 %
FEV1-Post: 3.4 L
FEV1-Pre: 3.29 L
FEV1FVC-%CHANGE-POST: 3 %
FEV1FVC-%Pred-Pre: 104 %
FEV6-%CHANGE-POST: 0 %
FEV6-%PRED-PRE: 93 %
FEV6-%Pred-Post: 93 %
FEV6-POST: 3.83 L
FEV6-Pre: 3.82 L
FEV6FVC-%CHANGE-POST: 0 %
FEV6FVC-%Pred-Post: 102 %
FEV6FVC-%Pred-Pre: 101 %
FVC-%Change-Post: 0 %
FVC-%Pred-Post: 91 %
FVC-%Pred-Pre: 91 %
FVC-PRE: 3.83 L
FVC-Post: 3.83 L
POST FEV1/FVC RATIO: 89 %
PRE FEV6/FVC RATIO: 100 %
Post FEV6/FVC ratio: 100 %
Pre FEV1/FVC ratio: 86 %
RV % PRED: 103 %
RV: 1.88 L
TLC % PRED: 86 %
TLC: 5.84 L

## 2015-01-29 MED ORDER — HEPARIN SOD (PORK) LOCK FLUSH 100 UNIT/ML IV SOLN
INTRAVENOUS | Status: AC
  Filled 2015-01-29: qty 5

## 2015-01-29 MED ORDER — ALBUTEROL SULFATE (2.5 MG/3ML) 0.083% IN NEBU
2.5000 mg | INHALATION_SOLUTION | Freq: Once | RESPIRATORY_TRACT | Status: AC
  Administered 2015-01-29: 2.5 mg via RESPIRATORY_TRACT

## 2015-01-29 MED ORDER — TECHNETIUM TC 99M-LABELED RED BLOOD CELLS IV KIT
25.0000 | PACK | Freq: Once | INTRAVENOUS | Status: AC | PRN
  Administered 2015-01-29: 27 via INTRAVENOUS

## 2015-01-29 NOTE — Progress Notes (Signed)
[place] Clinical Social Work  Clinical Social Work was referred by Futures trader for assessment of psychosocial needs due to adjustment to illness concerns and help with resources.  Clinical Social Worker met with patient briefly at [facility] to offer support and assess for needs.  Pt was also meeting with Financial Counselor today to review medicaid application. Pt reports to live with his father. He reports to have good support from his sister and mother. They are both aware of his illness and supportive. Pt has been hesitant to tell his father yet as his father has diabetes that is impacted by stressors, per pt. Pt also reports to have a 9yo son. Most troubling currently for pt is his eyesight. He is very stressed and has lost sleep due to his vision issues. He shared the medicine prescribed to "help sleep" is not helping currently. He plans to address this with MD. CSW explained role of CSW and suggested to pt availability of support through [place]. CSW will continue to follow and assist.    Clinical Social Work interventions: Supportive listening Resource education  Loren Racer, [place] Tuesdays 8:30-1pm Wednesdays 8:30-12pm  [phone]

## 2015-01-31 ENCOUNTER — Ambulatory Visit (HOSPITAL_COMMUNITY): Payer: Medicaid Other | Admitting: Anesthesiology

## 2015-01-31 ENCOUNTER — Ambulatory Visit (HOSPITAL_COMMUNITY): Payer: Medicaid Other

## 2015-01-31 ENCOUNTER — Ambulatory Visit (HOSPITAL_COMMUNITY)
Admission: RE | Admit: 2015-01-31 | Discharge: 2015-01-31 | Disposition: A | Discharge status: Home / Self Care | Payer: Medicaid Other | Source: Ambulatory Visit | Attending: General Surgery | Admitting: General Surgery

## 2015-01-31 ENCOUNTER — Encounter (HOSPITAL_COMMUNITY): Payer: Self-pay | Admitting: Anesthesiology

## 2015-01-31 ENCOUNTER — Encounter (HOSPITAL_COMMUNITY)
Admission: RE | Disposition: A | Discharge status: Home / Self Care | Payer: Self-pay | Source: Ambulatory Visit | Attending: General Surgery

## 2015-01-31 DIAGNOSIS — Z95828 Presence of other vascular implants and grafts: Secondary | ICD-10-CM

## 2015-01-31 DIAGNOSIS — R52 Pain, unspecified: Secondary | ICD-10-CM

## 2015-01-31 DIAGNOSIS — F17210 Nicotine dependence, cigarettes, uncomplicated: Secondary | ICD-10-CM | POA: Diagnosis not present

## 2015-01-31 DIAGNOSIS — C8190 Hodgkin lymphoma, unspecified, unspecified site: Secondary | ICD-10-CM | POA: Insufficient documentation

## 2015-01-31 HISTORY — PX: PORTACATH PLACEMENT: SHX2246

## 2015-01-31 LAB — CHROMOSOME ANALYSIS, BONE MARROW

## 2015-01-31 SURGERY — INSERTION, TUNNELED CENTRAL VENOUS DEVICE, WITH PORT
Anesthesia: Monitor Anesthesia Care | Site: Chest | Laterality: Left

## 2015-01-31 MED ORDER — FENTANYL CITRATE (PF) 100 MCG/2ML IJ SOLN
25.0000 ug | INTRAMUSCULAR | Status: AC
  Administered 2015-01-31 (×2): 25 ug via INTRAVENOUS

## 2015-01-31 MED ORDER — CEFAZOLIN SODIUM-DEXTROSE 2-3 GM-% IV SOLR
2.0000 g | INTRAVENOUS | Status: AC
  Administered 2015-01-31: 2 g via INTRAVENOUS

## 2015-01-31 MED ORDER — MIDAZOLAM HCL 2 MG/2ML IJ SOLN
INTRAMUSCULAR | Status: AC
  Filled 2015-01-31: qty 2

## 2015-01-31 MED ORDER — FENTANYL CITRATE (PF) 100 MCG/2ML IJ SOLN
INTRAMUSCULAR | Status: AC
  Filled 2015-01-31: qty 2

## 2015-01-31 MED ORDER — PROPOFOL INFUSION 10 MG/ML OPTIME
INTRAVENOUS | Status: DC | PRN
  Administered 2015-01-31: 75 ug/kg/min via INTRAVENOUS

## 2015-01-31 MED ORDER — FENTANYL CITRATE (PF) 250 MCG/5ML IJ SOLN
INTRAMUSCULAR | Status: DC | PRN
Start: 2015-01-31 — End: 2015-01-31
  Administered 2015-01-31 (×2): 50 ug via INTRAVENOUS

## 2015-01-31 MED ORDER — HEPARIN SOD (PORK) LOCK FLUSH 100 UNIT/ML IV SOLN
INTRAVENOUS | Status: DC | PRN
  Administered 2015-01-31: 500 [IU]

## 2015-01-31 MED ORDER — CHLORHEXIDINE GLUCONATE 4 % EX LIQD: 1.0000 "application " | Freq: Once | CUTANEOUS | Status: DC

## 2015-01-31 MED ORDER — HEPARIN SOD (PORK) LOCK FLUSH 100 UNIT/ML IV SOLN
INTRAVENOUS | Status: AC
  Filled 2015-01-31: qty 5

## 2015-01-31 MED ORDER — LACTATED RINGERS IV SOLN
INTRAVENOUS | Status: DC
  Administered 2015-01-31: 08:00:00 via INTRAVENOUS

## 2015-01-31 MED ORDER — MIDAZOLAM HCL 2 MG/2ML IJ SOLN
1.0000 mg | INTRAMUSCULAR | Status: DC | PRN
  Administered 2015-01-31: 2 mg via INTRAVENOUS

## 2015-01-31 MED ORDER — ONDANSETRON HCL 4 MG/2ML IJ SOLN
INTRAMUSCULAR | Status: AC
  Filled 2015-01-31: qty 2

## 2015-01-31 MED ORDER — MIDAZOLAM HCL 5 MG/5ML IJ SOLN
INTRAMUSCULAR | Status: DC | PRN
  Administered 2015-01-31: 2 mg via INTRAVENOUS

## 2015-01-31 MED ORDER — HYDROCODONE-ACETAMINOPHEN 5-325 MG PO TABS: 1.0000 | ORAL_TABLET | ORAL | Status: DC | PRN

## 2015-01-31 MED ORDER — FENTANYL CITRATE (PF) 100 MCG/2ML IJ SOLN: 25.0000 ug | INTRAMUSCULAR | Status: DC | PRN

## 2015-01-31 MED ORDER — SODIUM CHLORIDE 0.9 % IR SOLN
Status: DC | PRN
  Administered 2015-01-31: 500 mL

## 2015-01-31 MED ORDER — KETOROLAC TROMETHAMINE 30 MG/ML IJ SOLN
30.0000 mg | Freq: Once | INTRAMUSCULAR | Status: AC
Start: 2015-01-31 — End: 2015-01-31
  Administered 2015-01-31: 30 mg via INTRAVENOUS

## 2015-01-31 MED ORDER — LIDOCAINE HCL (PF) 1 % IJ SOLN
INTRAMUSCULAR | Status: AC
  Filled 2015-01-31: qty 30

## 2015-01-31 MED ORDER — KETOROLAC TROMETHAMINE 30 MG/ML IJ SOLN
INTRAMUSCULAR | Status: AC
  Filled 2015-01-31: qty 1

## 2015-01-31 MED ORDER — ONDANSETRON HCL 4 MG/2ML IJ SOLN
4.0000 mg | Freq: Once | INTRAMUSCULAR | Status: AC
  Administered 2015-01-31: 4 mg via INTRAVENOUS

## 2015-01-31 MED ORDER — LIDOCAINE HCL (PF) 1 % IJ SOLN
INTRAMUSCULAR | Status: DC | PRN
  Administered 2015-01-31: 8 mL

## 2015-01-31 MED ORDER — CEFAZOLIN SODIUM-DEXTROSE 2-3 GM-% IV SOLR
INTRAVENOUS | Status: AC
  Filled 2015-01-31: qty 50

## 2015-01-31 MED ORDER — ONDANSETRON HCL 4 MG/2ML IJ SOLN: 4.0000 mg | Freq: Once | INTRAMUSCULAR | Status: DC | PRN

## 2015-01-31 SURGICAL SUPPLY — 38 items
APPLIER CLIP 9.375 SM OPEN (CLIP)
BAG DECANTER FOR FLEXI CONT (MISCELLANEOUS) ×3 IMPLANT
BAG HAMPER (MISCELLANEOUS) ×3 IMPLANT
CATH HICKMAN DUAL 12.0 (CATHETERS) IMPLANT
CLIP APPLIE 9.375 SM OPEN (CLIP) IMPLANT
CLOTH BEACON ORANGE TIMEOUT ST (SAFETY) ×3 IMPLANT
COVER LIGHT HANDLE STERIS (MISCELLANEOUS) ×6 IMPLANT
DECANTER SPIKE VIAL GLASS SM (MISCELLANEOUS) ×3 IMPLANT
DRAPE C-ARM FOLDED MOBILE STRL (DRAPES) ×3 IMPLANT
DURAPREP 6ML APPLICATOR 50/CS (WOUND CARE) ×3 IMPLANT
ELECT REM PT RETURN 9FT ADLT (ELECTROSURGICAL) ×3
ELECTRODE REM PT RTRN 9FT ADLT (ELECTROSURGICAL) ×1 IMPLANT
GLOVE BIO SURGEON STRL SZ 6.5 (GLOVE) ×2 IMPLANT
GLOVE BIO SURGEONS STRL SZ 6.5 (GLOVE) ×1
GLOVE BIOGEL PI IND STRL 7.0 (GLOVE) ×1 IMPLANT
GLOVE BIOGEL PI IND STRL 7.5 (GLOVE) ×1 IMPLANT
GLOVE BIOGEL PI INDICATOR 7.0 (GLOVE) ×2
GLOVE BIOGEL PI INDICATOR 7.5 (GLOVE) ×2
GLOVE SURG SS PI 7.5 STRL IVOR (GLOVE) ×6 IMPLANT
GOWN STRL REUS W/TWL LRG LVL3 (GOWN DISPOSABLE) ×6 IMPLANT
IV NS 500ML (IV SOLUTION) ×2
IV NS 500ML BAXH (IV SOLUTION) ×1 IMPLANT
KIT PORT POWER 8FR ISP MRI (CATHETERS) ×3 IMPLANT
KIT ROOM TURNOVER APOR (KITS) ×3 IMPLANT
LIQUID BAND (GAUZE/BANDAGES/DRESSINGS) ×3 IMPLANT
MANIFOLD NEPTUNE II (INSTRUMENTS) ×3 IMPLANT
NEEDLE HYPO 25X1 1.5 SAFETY (NEEDLE) ×3 IMPLANT
PACK MINOR (CUSTOM PROCEDURE TRAY) ×3 IMPLANT
PAD ARMBOARD 7.5X6 YLW CONV (MISCELLANEOUS) ×3 IMPLANT
SET BASIN LINEN APH (SET/KITS/TRAYS/PACK) ×3 IMPLANT
SET INTRODUCER 12FR PACEMAKER (SHEATH) IMPLANT
SHEATH COOK PEEL AWAY SET 8F (SHEATH) IMPLANT
SUT PROLENE 3 0 PS 2 (SUTURE) IMPLANT
SUT VIC AB 3-0 SH 27 (SUTURE) ×2
SUT VIC AB 3-0 SH 27X BRD (SUTURE) ×1 IMPLANT
SUT VIC AB 4-0 PS2 27 (SUTURE) ×3 IMPLANT
SYR 20CC LL (SYRINGE) ×3 IMPLANT
SYR CONTROL 10ML LL (SYRINGE) ×3 IMPLANT

## 2015-01-31 NOTE — Interval H&P Note (Signed)
History and Physical Interval Note:  01/31/2015 8:53 AM  Robert Acevedo  has presented today for surgery, with the diagnosis of lymphoma  The various methods of treatment have been discussed with the patient and family. After consideration of risks, benefits and other options for treatment, the patient has consented to  Procedure(s): INSERTION PORT-A-CATH (N/A) as a surgical intervention .  The patient's history has been reviewed, patient examined, no change in status, stable for surgery.  I have reviewed the patient's chart and labs.  Questions were answered to the patient's satisfaction.     Aviva Signs A

## 2015-01-31 NOTE — Discharge Instructions (Signed)
Implanted Port Home Guide °An implanted port is a type of central line that is placed under the skin. Central lines are used to provide IV access when treatment or nutrition needs to be given through a person's veins. Implanted ports are used for long-term IV access. An implanted port may be placed because:  °· You need IV medicine that would be irritating to the small veins in your hands or arms.   °· You need long-term IV medicines, such as antibiotics.   °· You need IV nutrition for a long period.   °· You need frequent blood draws for lab tests.   °· You need dialysis.   °Implanted ports are usually placed in the chest area, but they can also be placed in the upper arm, the abdomen, or the leg. An implanted port has two main parts:  °· Reservoir. The reservoir is round and will appear as a small, raised area under your skin. The reservoir is the part where a needle is inserted to give medicines or draw blood.   °· Catheter. The catheter is a thin, flexible tube that extends from the reservoir. The catheter is placed into a large vein. Medicine that is inserted into the reservoir goes into the catheter and then into the vein.   °HOW WILL I CARE FOR MY INCISION SITE? °Do not get the incision site wet. Bathe or shower as directed by your health care provider.  °HOW IS MY PORT ACCESSED? °Special steps must be taken to access the port:  °· Before the port is accessed, a numbing cream can be placed on the skin. This helps numb the skin over the port site.   °· Your health care provider uses a sterile technique to access the port. °· Your health care provider must put on a mask and sterile gloves. °· The skin over your port is cleaned carefully with an antiseptic and allowed to dry. °· The port is gently pinched between sterile gloves, and a needle is inserted into the port. °· Only "non-coring" port needles should be used to access the port. Once the port is accessed, a blood return should be checked. This helps  ensure that the port is in the vein and is not clogged.   °· If your port needs to remain accessed for a constant infusion, a clear (transparent) bandage will be placed over the needle site. The bandage and needle will need to be changed every week, or as directed by your health care provider.   °· Keep the bandage covering the needle clean and dry. Do not get it wet. Follow your health care provider's instructions on how to take a shower or bath while the port is accessed.   °· If your port does not need to stay accessed, no bandage is needed over the port.   °WHAT IS FLUSHING? °Flushing helps keep the port from getting clogged. Follow your health care provider's instructions on how and when to flush the port. Ports are usually flushed with saline solution or a medicine called heparin. The need for flushing will depend on how the port is used.  °· If the port is used for intermittent medicines or blood draws, the port will need to be flushed:   °· After medicines have been given.   °· After blood has been drawn.   °· As part of routine maintenance.   °· If a constant infusion is running, the port may not need to be flushed.   °HOW LONG WILL MY PORT STAY IMPLANTED? °The port can stay in for as long as your health care   provider thinks it is needed. When it is time for the port to come out, surgery will be done to remove it. The procedure is similar to the one performed when the port was put in.  WHEN SHOULD I SEEK IMMEDIATE MEDICAL CARE? When you have an implanted port, you should seek immediate medical care if:   You notice a bad smell coming from the incision site.   You have swelling, redness, or drainage at the incision site.   You have more swelling or pain at the port site or the surrounding area.   You have a fever that is not controlled with medicine. Document Released: 08/16/2005 Document Revised: 06/06/2013 Document Reviewed: 04/23/2013 [facility] Patient Information 2015 [place], [place]. This  information is not intended to replace advice given to you by your health care provider. Make sure you discuss any questions you have with your health care provider. Implanted Port Insertion, Care After Refer to this sheet in the next few weeks. These instructions provide you with information on caring for yourself after your procedure. Your health care provider may also give you more specific instructions. Your treatment has been planned according to current medical practices, but problems sometimes occur. Call your health care provider if you have any problems or questions after your procedure. WHAT TO EXPECT AFTER THE PROCEDURE After your procedure, it is typical to have the following:   Discomfort at the port insertion site. Ice packs to the area will help.  Bruising on the skin over the port. This will subside in 3-4 days. HOME CARE INSTRUCTIONS  After your port is placed, you will get a manufacturer's information card. The card has information about your port. Keep this card with you at all times.   Know what kind of port you have. There are many types of ports available.   Wear a medical alert bracelet in case of an emergency. This can help alert health care workers that you have a port.   The port can stay in for as long as your health care provider believes it is necessary.   A home health care nurse may give medicines and take care of the port.   You or a family member can get special training and directions for giving medicine and taking care of the port at home.  SEEK MEDICAL CARE IF:   Your port does not flush or you are unable to get a blood return.   You have a fever or chills. SEEK IMMEDIATE MEDICAL CARE IF:  You have new fluid or pus coming from your incision.   You notice a bad smell coming from your incision site.   You have swelling, pain, or more redness at the incision or port site.   You have chest pain or shortness of breath. Document Released:  06/06/2013 Document Revised: 08/21/2013 Document Reviewed: 06/06/2013 [facility] Patient Information 2015 [place], [place]. This information is not intended to replace advice given to you by your health care provider. Make sure you discuss any questions you have with your health care provider.     PATIENT INSTRUCTIONS POST-ANESTHESIA  IMMEDIATELY FOLLOWING SURGERY:  Do not drive or operate machinery for the first twenty four hours after surgery.  Do not make any important decisions for twenty four hours after surgery or while taking narcotic pain medications or sedatives.  If you develop intractable nausea and vomiting or a severe headache please notify your doctor immediately.  FOLLOW-UP:  Please make an appointment with your surgeon as instructed. You  do not need to follow up with anesthesia unless specifically instructed to do so.  WOUND CARE INSTRUCTIONS (if applicable):  Keep a dry clean dressing on the anesthesia/puncture wound site if there is drainage.  Once the wound has quit draining you may leave it open to air.  Generally you should leave the bandage intact for twenty four hours unless there is drainage.  If the epidural site drains for more than 36-48 hours please call the anesthesia department.  QUESTIONS?:  Please feel free to call your physician or the hospital operator if you have any questions, and they will be happy to assist you.

## 2015-01-31 NOTE — Anesthesia Preprocedure Evaluation (Signed)
Anesthesia Evaluation  Patient identified by MRN, date of birth, ID band Patient awake    Reviewed: Allergy & Precautions, NPO status , Patient's Chart, lab work & pertinent test results  History of Anesthesia Complications Negative for: history of anesthetic complications  Airway Mallampati: I  TM Distance: >3 FB     Dental  (+) Teeth Intact, Dental Advisory Given   Pulmonary Current Smoker,  breath sounds clear to auscultation        Cardiovascular negative cardio ROS  Rhythm:Regular Rate:Normal     Neuro/Psych    GI/Hepatic   Endo/Other    Renal/GU      Musculoskeletal   Abdominal   Peds  Hematology   Anesthesia Other Findings   Reproductive/Obstetrics                             Anesthesia Physical Anesthesia Plan  ASA: I  Anesthesia Plan: MAC   Post-op Pain Management:    Induction: Intravenous  Airway Management Planned: Simple Face Mask  Additional Equipment:   Intra-op Plan:   Post-operative Plan: Extubation in OR  Informed Consent: I have reviewed the patients History and Physical, chart, labs and discussed the procedure including the risks, benefits and alternatives for the proposed anesthesia with the patient or authorized representative who has indicated his/her understanding and acceptance.     Plan Discussed with:   Anesthesia Plan Comments:         Anesthesia Quick Evaluation

## 2015-01-31 NOTE — Transfer of Care (Signed)
Immediate Anesthesia Transfer of Care Note  Patient: Robert Acevedo  Procedure(s) Performed: Procedure(s): INSERTION PORT-A-CATH (Left)  Patient Location: PACU  Anesthesia Type:MAC  Level of Consciousness: awake, alert  and oriented  Airway & Oxygen Therapy: Patient Spontanous Breathing and Patient connected to nasal cannula oxygen  Post-op Assessment: Report given to RN  Post vital signs: Reviewed and stable  Last Vitals:  Filed Vitals:   01/31/15 0903  BP: 104/68  Pulse: 68  Temp:   Resp: 18    Complications: No apparent anesthesia complications

## 2015-01-31 NOTE — Anesthesia Postprocedure Evaluation (Signed)
  Anesthesia Post-op Note  Patient: Robert Acevedo  Procedure(s) Performed: Procedure(s): INSERTION PORT-A-CATH (Left)  Patient Location: PACU  Anesthesia Type:MAC  Level of Consciousness: awake, alert  and oriented  Airway and Oxygen Therapy: Patient Spontanous Breathing  Post-op Pain: none  Post-op Assessment: Post-op Vital signs reviewed, Patient's Cardiovascular Status Stable, Respiratory Function Stable, Patent Airway and No signs of Nausea or vomiting  Post-op Vital Signs: Reviewed and stable  Last Vitals:  Filed Vitals:   01/31/15 0956  BP:   Pulse:   Temp: 36.9 C  Resp:     Complications: No apparent anesthesia complications

## 2015-01-31 NOTE — Op Note (Signed)
Patient:  Robert Acevedo  DOB:  04/03/1971  MRN:  [id number]   Preop Diagnosis:  Lymphoma, need for central venous access  Postop Diagnosis:  Same  Procedure:  Port-A-Cath insertion  Surgeon:  Aviva Signs, M.D.  Anes:  Mac  Indications:  Patient is a 44 year old black male recently diagnosed with Hodgkin's lymphoma who now presents for a Port-A-Cath insertion. Risks and benefits of the procedure including bleeding, infection, and pneumothorax were fully explained to the patient, who gave informed consent.  Procedure note:  The patient was placed in the Trendelenburg position after the left upper chest was prepped and draped using usual sterile technique with DuraPrep. Surgical site confirmation was performed. 1% Xylocaine was used for local anesthesia.  Incision was made below the left clavicle. Subcutaneous pocket was then formed. A needle is advanced into the left subclavian vein using the Seldinger technique without difficulty. A guidewire was then advanced into the right atrium under fluoroscopic guidance. An introducer and peel-away sheath were then placed over the guidewire. The catheter was inserted through the peel-away sheath the peel-away sheath was removed. The catheter was then attached to the port and the port placed in its subcutaneous pocket. Adequate positioning was confirmed by fluoroscopy. Good backflow blood was noted in the port. The port was flushed with heparin flush. The subcutaneous layer was reapproximated using 3-0 Vicryl interrupted suture. The skin was closed using a 4-0 Vicryl subcuticular suture. Liquiband was applied.  All tape and needle counts were correct at the end of the procedure. Patient was transferred to PACU in stable condition. A chest x-ray will be performed at that time.  Complications:  None  EBL:  Minimal  Specimen:  None

## 2015-02-01 NOTE — Patient Instructions (Addendum)
[place]   CHEMOTHERAPY INSTRUCTIONS  Premeds: Aloxi- to reduce/prevent nausea/vomiting (lasts in body for approximately 48-72 hours). Emend - to reduce/prevent nausea/vomiting (lasts in body for approximately 48-72 hours). Dexamethasone - steroid - given to reduce/prevent nausea/vomiting. Side Effects of Dexamethasone - make you feel nervous/jittery/anxious, have trouble sleeping, make you feel flushed/hot in the face/neck. These side effects will pass as the dexamethasone wears off. (takes 30 minutes to infuse)   Adriamycin - bone marrow suppression, nausea, vomiting, hair loss, mouth sores, cardiotoxicity--can weaken the pumping muscle of the heart (this is why we do the MUGA scans), sensitivity to light (burn easily), will turn urine red for a few voids after receiving it. After voiding red a few times, your urine should begin to go back to a normal yellow color.  (takes 10-15 minutes to infuse, a nurse will sit at your bedside and push this into your IV line)  Vinblastine - bone marrow suppression (lowers white blood cells (fight infection), lowers red blood cells (make up your blood), lowers platelets (help blood to clot). Hair loss, loss of appetite, jaw pain, peripheral neuropathy (numbness/tingling/burning in hands/fingers/feet/toes, constipation.   Bleomycin - hair loss, kidney toxicity, pulmonary fibrosis, fever, chills, hypersensitivity or anaphylactic reaction (rare). Patients with lymphoma have a higher incidence of anaphylaxis after receiving bleomycin than do other patients who receive the drug. Patients who have received bleomycin are at risk for pulmonary toxicity when exposed to oxygen during surgery. You must always -for the rest of your life- disclose to your surgeon/anesthesiologist that you have taken bleomycin to prevent a fatal episode of pulmonary failure. The first sign of pulmonary toxicity is a cough.So if you develop a cough you must let  us know!!!!! If you develop shortness of breath you must let us know!!!!  (this takes 10 minutes to infuse)   Dacarbazine - severe neutropenia (low white blood cells (fight infection), and severe lowering of your platelets (which help you blood clot). Your blood counts will be at their lowest 2-3 weeks after receiving your treatment and it could potentially be a little further out than that. This is when you will be most susceptible to picking up a virus, infection, etc. You will receive this medication every 2 weeks therefore we will be keeping a close watch on your blood counts through blood draws. It can also cause severe nausea and vomiting, loss of appetite, rash, flu-like syndrome (muscle aches, fever, chillls, poor appetite, fatigue, headache), low blood pressure, light sensitivity. (this medication takes 60 minutes to infuse).  It is very important that you call us as soon as you begin to feel bad. If it is the weekend you report to the Emergency Room and present your chemotherapy card to the receptionist at the desk. Do not ignore fever, chills, cough, extreme fatigue, feeling horrible!!! You must notify us or go to the Emergency Room if we are not here when you are feeling sick. Side Effects of chemo are much better managed if we know sooner rather than later. Do not wait!!! Make sure you notify us!!!    POTENTIAL SIDE EFFECTS OF TREATMENT: Increased Susceptibility to Infection, Vomiting, Constipation, Red or Pink Urine (with Adriamycin), Changes in Character of Skin and Nails (brittleness, dryness,etc.), Pigment Changes (darkening of veins, nail beds, palms of hands, soles of feet, etc.), Bone Marrow Suppression, Blood in Urine, Complete Hair Loss, Nausea, Diarrhea, Sun Sensitivity and Mouth Sores   SELF IMAGE NEEDS AND REFERRALS MADE: Obtain hair accessories  as soon as possible (caps,etc.)   EDUCATIONAL MATERIALS GIVEN AND REVIEWED: Chemotherapy and You booklet Specific Instructions  Sheets: Aloxi, Emend, Dexamethasone, Adriamycin, Vinblastine, Bleomycin, Dacarbazine, Zofran, Compazine, EMLA cream   SELF CARE ACTIVITIES WHILE ON CHEMOTHERAPY: Increase your fluid intake 48 hours prior to treatment and drink at least 2 quarts per day after treatment., No alcohol intake., No aspirin or other medications unless approved by your oncologist., Eat foods that are light and easy to digest., Eat foods at cold or room temperature., No fried, fatty, or spicy foods immediately before or after treatment., Have teeth cleaned professionally before starting treatment. Keep dentures and partial plates clean., Use soft toothbrush and do not use mouthwashes that contain alcohol. Biotene is a good mouthwash that is available at most pharmacies or may be ordered by calling [phone]., Use warm salt water gargles (1 teaspoon salt per 1 quart warm water) before and after meals and at bedtime. Or you may rinse with 2 tablespoons of three -percent hydrogen peroxide mixed in eight ounces of water., Always use sunscreen with SPF (Sun Protection Factor) of 30 or higher., Use your nausea medication as directed to prevent nausea., Use your stool softener or laxative as directed to prevent constipation. and Use your anti-diarrheal medication as directed to stop diarrhea.  Please wash your hands for at least 30 seconds using warm soapy water. Handwashing is the #1 way to prevent the spread of germs. Stay away from sick people or people who are getting over a cold. If you develop respiratory systems such as green/yellow mucus production or productive cough or persistent cough let us know and we will see if you need an antibiotic. It is a good idea to keep a pair of gloves on when going into grocery stores/[employer] to decrease your risk of coming into contact with germs on the carts, etc. Carry alcohol hand gel with you at all times and use it frequently if out in public. All foods need to be cooked thoroughly. No raw  foods. No medium or undercooked meats, eggs. If your food is cooked medium well, it does not need to be hot pink or saturated with bloody liquid at all. Vegetables and fruits need to be washed/rinsed under the faucet with a dish detergent before being consumed. You can eat raw fruits and vegetables unless we tell you otherwise but it would be best if you cooked them or bought frozen. Do not eat off of salad bars or hot bars unless you really trust the cleanliness of the restaurant. If you need dental work, please let Dr. Whitney Muse know before you go for your appointment so that we can coordinate the best possible time for you in regards to your chemo regimen. You need to also let your dentist know that you are actively taking chemo. We may need to do labs prior to your dental appointment. We also want your bowels moving at least every other day. If this is not happening, we need to know so that we can get you on a bowel regimen to help you go.     MEDICATIONS: You have been given prescriptions for the following medications:  Zofran 8mg  tablet. Take 1 tablet every 8 hours as needed for nausea/vomiting. (#1 nausea med to take, this can constipate)  Compazine 10mg  tablet. Take 1 tablet every 6 hours as needed for nausea/vomiting. (#2 nausea med to take, this can make you sleepy)  EMLA cream. Apply a quarter size amount to port site 1 hour prior  to chemo. Do not rub in. Cover with plastic wrap.   Over-the-Counter Meds:  Miralax 1 capful in 8 oz of fluid daily. May increase to two times a day if needed. This is a stool softener. If this doesn't work proceed you can add:  Senokot S  - start with 1 tablet two times a day and increase to 4 tablets two times a day if needed. (total of 8 tablets in a 24 hour period). This is a stimulant laxative.   Call us if this does not help your bowels move.   Imodium 2mg  capsule. Take 2 capsules after the 1st loose stool and then 1 capsule every 2 hours until you go a  total of 12 hours without having a loose stool. Call the [facility] if loose stools continue.   SYMPTOMS TO REPORT AS SOON AS POSSIBLE AFTER TREATMENT:  FEVER GREATER THAN 100.5 F  CHILLS WITH OR WITHOUT FEVER  NAUSEA AND VOMITING THAT IS NOT CONTROLLED WITH YOUR NAUSEA MEDICATION  UNUSUAL SHORTNESS OF BREATH  UNUSUAL BRUISING OR BLEEDING  TENDERNESS IN MOUTH AND THROAT WITH OR WITHOUT PRESENCE OF ULCERS  URINARY PROBLEMS  BOWEL PROBLEMS  UNUSUAL RASH    Wear comfortable clothing and clothing appropriate for easy access to any Portacath or PICC line. Let us know if there is anything that we can do to make your therapy better!      I have been informed and understand all of the instructions given to me and have received a copy. I have been instructed to call the clinic [phone] or my family physician as soon as possible for continued medical care, if indicated. I do not have any more questions at this time but understand that I may call the [facility] or the Patient Navigator at [phone] during office hours should I have questions or need assistance in obtaining follow-up care.           Palonosetron Injection What is this medicine? PALONOSETRON (pal oh NOE se tron) is used to prevent nausea and vomiting caused by chemotherapy. It also helps prevent delayed nausea and vomiting that may occur a few days after your treatment. This medicine may be used for other purposes; ask your health care provider or pharmacist if you have questions. COMMON BRAND NAME(S): Aloxi What should I tell my health care provider before I take this medicine? They need to know if you have any of these conditions: -an unusual or allergic reaction to palonosetron, dolasetron, granisetron, ondansetron, other medicines, foods, dyes, or preservatives -pregnant or trying to get pregnant -breast-feeding How should I use this medicine? This medicine is for infusion into a vein. It  is given by a health care professional in a hospital or clinic setting. Talk to your pediatrician regarding the use of this medicine in children. While this drug may be prescribed for children as young as 1 month for selected conditions, precautions do apply. Overdosage: If you think you have taken too much of this medicine contact a poison control center or emergency room at once. NOTE: This medicine is only for you. Do not share this medicine with others. What if I miss a dose? This does not apply. What may interact with this medicine? -certain medicines for depression, anxiety, or psychotic disturbances -fentanyl -linezolid -MAOIs like Carbex, Eldepryl, Marplan, Nardil, and Parnate -methylene blue (injected into a vein) -tramadol This list may not describe all possible interactions. Give your health care provider a list of all the medicines,  herbs, non-prescription drugs, or dietary supplements you use. Also tell them if you smoke, drink alcohol, or use illegal drugs. Some items may interact with your medicine. What should I watch for while using this medicine? Your condition will be monitored carefully while you are receiving this medicine. What side effects may I notice from receiving this medicine? Side effects that you should report to your doctor or health care professional as soon as possible: -allergic reactions like skin rash, itching or hives, swelling of the face, lips, or tongue -breathing problems -confusion -dizziness -fast, irregular heartbeat -fever and chills -loss of balance or coordination -seizures -sweating -swelling of the hands and feet -tremors -unusually weak or tired Side effects that usually do not require medical attention (report to your doctor or health care professional if they continue or are bothersome): -constipation or diarrhea -headache This list may not describe all possible side effects. Call your doctor for medical advice about side effects.  You may report side effects to FDA at 1-800-FDA-1088. Where should I keep my medicine? This drug is given in a hospital or clinic and will not be stored at home. NOTE: This sheet is a summary. It may not cover all possible information. If you have questions about this medicine, talk to your doctor, pharmacist, or health care provider.  2015, Elsevier/Gold Standard. (2013-06-22 10:38:36) Fosaprepitant injection What is this medicine? FOSAPREPITANT (fos ap RE pi tant) is used together with other medicines to prevent nausea and vomiting caused by cancer treatment (chemotherapy). This medicine may be used for other purposes; ask your health care provider or pharmacist if you have questions. COMMON BRAND NAME(S): Emend What should I tell my health care provider before I take this medicine? They need to know if you have any of these conditions: -liver disease -an unusual or allergic reaction to fosaprepitant, aprepitant, medicines, foods, dyes, or preservatives -pregnant or trying to get pregnant -breast-feeding How should I use this medicine? This medicine is for injection into a vein. It is given by a health care professional in a hospital or clinic setting. Talk to your pediatrician regarding the use of this medicine in children. Special care may be needed. Overdosage: If you think you have taken too much of this medicine contact a poison control center or emergency room at once. NOTE: This medicine is only for you. Do not share this medicine with others. What if I miss a dose? This does not apply. What may interact with this medicine? Do not take this medicine with any of these medicines: -cisapride -pimozide -ranolazine This medicine may also interact with the following medications: -diltiazem -male hormones, like estrogens or progestins and birth control pills -medicines for fungal infections like ketoconazole and itraconazole -medicines for HIV -medicines for seizures or to  control epilepsy like carbamazepine or phenytoin -medicines used for sleep or anxiety disorders like alprazolam, diazepam, or midazolam -nefazodone -paroxetine -rifampin -some chemotherapy medications like etoposide, ifosfamide, vinblastine, vincristine -some antibiotics like clarithromycin, erythromycin, troleandomycin -steroid medicines like dexamethasone or methylprednisolone -tolbutamide -warfarin This list may not describe all possible interactions. Give your health care provider a list of all the medicines, herbs, non-prescription drugs, or dietary supplements you use. Also tell them if you smoke, drink alcohol, or use illegal drugs. Some items may interact with your medicine. What should I watch for while using this medicine? Do not take this medicine if you already have nausea and vomiting. Ask your health care provider what to do if you already have nausea. Birth control pills  may not work properly while you are taking this medicine. Talk to your doctor about using an extra method of birth control. This medicine should not be used continuously for a long time. Visit your doctor or health care professional for regular check-ups. This medicine may change your liver function blood test results. What side effects may I notice from receiving this medicine? Side effects that you should report to your doctor or health care professional as soon as possible: -allergic reactions like skin rash, itching or hives, swelling of the face, lips, or tongue -breathing problems -changes in heart rhythm -high or low blood pressure -pain, redness, or irritation at site where injected -rectal bleeding -serious dizziness or disorientation, confusion -sharp or severe stomach pain -sharp pain in your leg Side effects that usually do not require medical attention (report to your doctor or health care professional if they continue or are bothersome): -constipation or diarrhea -hair  loss -headache -hiccups -loss of appetite -nausea -upset stomach -tiredness This list may not describe all possible side effects. Call your doctor for medical advice about side effects. You may report side effects to FDA at 1-800-FDA-1088. Where should I keep my medicine? This drug is given in a hospital or clinic and will not be stored at home. NOTE: This sheet is a summary. It may not cover all possible information. If you have questions about this medicine, talk to your doctor, pharmacist, or health care provider.  2015, Elsevier/Gold Standard. (2009-07-29 12:46:13) Lidocaine; Prilocaine cream What is this medicine? LIDOCAINE; PRILOCAINE (LYE doe kane; PRIL oh kane) is a topical anesthetic that causes loss of feeling in the skin and surrounding tissues. It is used to numb the skin before procedures or injections. This medicine may be used for other purposes; ask your health care provider or pharmacist if you have questions. COMMON BRAND NAME(S): EMLA What should I tell my health care provider before I take this medicine? They need to know if you have any of these conditions: -glucose-6-phosphate deficiencies -heart disease -kidney or liver disease -methemoglobinemia -an unusual or allergic reaction to lidocaine, prilocaine, other medicines, foods, dyes, or preservatives -pregnant or trying to get pregnant -breast-feeding How should I use this medicine? This medicine is for external use only on the skin. Do not take by mouth. Follow the directions on the prescription label. Wash hands before and after use. Do not use more or leave in contact with the skin longer than directed. Do not apply to eyes or open wounds. It can cause irritation and blurred or temporary loss of vision. If this medicine comes in contact with your eyes, immediately rinse the eye with water. Do not touch or rub the eye. Contact your health care provider right away. Talk to your pediatrician regarding the use of  this medicine in children. While this medicine may be prescribed for children for selected conditions, precautions do apply. Overdosage: If you think you have taken too much of this medicine contact a poison control center or emergency room at once. NOTE: This medicine is only for you. Do not share this medicine with others. What if I miss a dose? This medicine is usually only applied once prior to each procedure. It must be in contact with the skin for a period of time for it to work. If you applied this medicine later than directed, tell your health care professional before starting the procedure. What may interact with this medicine? -acetaminophen -chloroquine -dapsone -medicines to control heart rhythm -nitrates like nitroglycerin and nitroprusside -  other ointments, creams, or sprays that may contain anesthetic medicine -phenobarbital -phenytoin -quinine -sulfonamides like sulfacetamide, sulfamethoxazole, sulfasalazine and others This list may not describe all possible interactions. Give your health care provider a list of all the medicines, herbs, non-prescription drugs, or dietary supplements you use. Also tell them if you smoke, drink alcohol, or use illegal drugs. Some items may interact with your medicine. What should I watch for while using this medicine? Be careful to avoid injury to the treated area while it is numb and you are not aware of pain. Avoid scratching, rubbing, or exposing the treated area to hot or cold temperatures until complete sensation has returned. The numb feeling will wear off a few hours after applying the cream. What side effects may I notice from receiving this medicine? Side effects that you should report to your doctor or health care professional as soon as possible: -blurred vision -chest pain -difficulty breathing -dizziness -drowsiness -fast or irregular heartbeat -skin rash or itching -swelling of your throat, lips, or face -trembling Side  effects that usually do not require medical attention (report to your doctor or health care professional if they continue or are bothersome): -changes in ability to feel hot or cold -redness and swelling at the application site This list may not describe all possible side effects. Call your doctor for medical advice about side effects. You may report side effects to FDA at 1-800-FDA-1088. Where should I keep my medicine? Keep out of reach of children. Store at room temperature between 15 and 30 degrees C (59 and 86 degrees F). Keep container tightly closed. Throw away any unused medicine after the expiration date. NOTE: This sheet is a summary. It may not cover all possible information. If you have questions about this medicine, talk to your doctor, pharmacist, or health care provider.  2015, Elsevier/Gold Standard. (2008-02-19 17:14:35) Doxorubicin injection What is this medicine? DOXORUBICIN (dox oh ROO bi sin) is a chemotherapy drug. It is used to treat many kinds of cancer like Hodgkin's disease, leukemia, non-Hodgkin's lymphoma, neuroblastoma, sarcoma, and Wilms' tumor. It is also used to treat bladder cancer, breast cancer, lung cancer, ovarian cancer, stomach cancer, and thyroid cancer. This medicine may be used for other purposes; ask your health care provider or pharmacist if you have questions. COMMON BRAND NAME(S): Adriamycin, Adriamycin PFS, Adriamycin RDF, Rubex What should I tell my health care provider before I take this medicine? They need to know if you have any of these conditions: -blood disorders -heart disease, recent heart attack -infection (especially a virus infection such as chickenpox, cold sores, or herpes) -irregular heartbeat -liver disease -recent or ongoing radiation therapy -an unusual or allergic reaction to doxorubicin, other chemotherapy agents, other medicines, foods, dyes, or preservatives -pregnant or trying to get pregnant -breast-feeding How should I  use this medicine? This drug is given as an infusion into a vein. It is administered in a hospital or clinic by a specially trained health care professional. If you have pain, swelling, burning or any unusual feeling around the site of your injection, tell your health care professional right away. Talk to your pediatrician regarding the use of this medicine in children. Special care may be needed. Overdosage: If you think you have taken too much of this medicine contact a poison control center or emergency room at once. NOTE: This medicine is only for you. Do not share this medicine with others. What if I miss a dose? It is important not to miss your dose. Call your  doctor or health care professional if you are unable to keep an appointment. What may interact with this medicine? Do not take this medicine with any of the following medications: -cisapride -droperidol -halofantrine -pimozide -zidovudine This medicine may also interact with the following medications: -chloroquine -chlorpromazine -clarithromycin -cyclophosphamide -cyclosporine -erythromycin -medicines for depression, anxiety, or psychotic disturbances -medicines for irregular heart beat like amiodarone, bepridil, dofetilide, encainide, flecainide, propafenone, quinidine -medicines for seizures like ethotoin, fosphenytoin, phenytoin -medicines for nausea, vomiting like dolasetron, ondansetron, palonosetron -medicines to increase blood counts like filgrastim, pegfilgrastim, sargramostim -methadone -methotrexate -pentamidine -progesterone -vaccines -verapamil Talk to your doctor or health care professional before taking any of these medicines: -acetaminophen -aspirin -ibuprofen -ketoprofen -naproxen This list may not describe all possible interactions. Give your health care provider a list of all the medicines, herbs, non-prescription drugs, or dietary supplements you use. Also tell them if you smoke, drink alcohol, or  use illegal drugs. Some items may interact with your medicine. What should I watch for while using this medicine? Your condition will be monitored carefully while you are receiving this medicine. You will need important blood work done while you are taking this medicine. This drug may make you feel generally unwell. This is not uncommon, as chemotherapy can affect healthy cells as well as cancer cells. Report any side effects. Continue your course of treatment even though you feel ill unless your doctor tells you to stop. Your urine may turn red for a few days after your dose. This is not blood. If your urine is dark or brown, call your doctor. In some cases, you may be given additional medicines to help with side effects. Follow all directions for their use. Call your doctor or health care professional for advice if you get a fever, chills or sore throat, or other symptoms of a cold or flu. Do not treat yourself. This drug decreases your body's ability to fight infections. Try to avoid being around people who are sick. This medicine may increase your risk to bruise or bleed. Call your doctor or health care professional if you notice any unusual bleeding. Be careful brushing and flossing your teeth or using a toothpick because you may get an infection or bleed more easily. If you have any dental work done, tell your dentist you are receiving this medicine. Avoid taking products that contain aspirin, acetaminophen, ibuprofen, naproxen, or ketoprofen unless instructed by your doctor. These medicines may hide a fever. Men and women of childbearing age should use effective birth control methods while using taking this medicine. Do not become pregnant while taking this medicine. There is a potential for serious side effects to an unborn child. Talk to your health care professional or pharmacist for more information. Do not breast-feed an infant while taking this medicine. Do not let others touch your urine or  other body fluids for 5 days after each treatment with this medicine. Caregivers should wear latex gloves to avoid touching body fluids during this time. There is a maximum amount of this medicine you should receive throughout your life. The amount depends on the medical condition being treated and your overall health. Your doctor will watch how much of this medicine you receive in your lifetime. Tell your doctor if you have taken this medicine before. What side effects may I notice from receiving this medicine? Side effects that you should report to your doctor or health care professional as soon as possible: -allergic reactions like skin rash, itching or hives, swelling of the face,  lips, or tongue -low blood counts - this medicine may decrease the number of white blood cells, red blood cells and platelets. You may be at increased risk for infections and bleeding. -signs of infection - fever or chills, cough, sore throat, pain or difficulty passing urine -signs of decreased platelets or bleeding - bruising, pinpoint red spots on the skin, black, tarry stools, blood in the urine -signs of decreased red blood cells - unusually weak or tired, fainting spells, lightheadedness -breathing problems -chest pain -fast, irregular heartbeat -mouth sores -nausea, vomiting -pain, swelling, redness at site where injected -pain, tingling, numbness in the hands or feet -swelling of ankles, feet, or hands -unusual bleeding or bruising Side effects that usually do not require medical attention (report to your doctor or health care professional if they continue or are bothersome): -diarrhea -facial flushing -hair loss -loss of appetite -missed menstrual periods -nail discoloration or damage -red or watery eyes -red colored urine -stomach upset This list may not describe all possible side effects. Call your doctor for medical advice about side effects. You may report side effects to FDA at  1-800-FDA-1088. Where should I keep my medicine? This drug is given in a hospital or clinic and will not be stored at home. NOTE: This sheet is a summary. It may not cover all possible information. If you have questions about this medicine, talk to your doctor, pharmacist, or health care provider.  2015, [employer]. (2012-12-12 09:54:34) Vinblastine injection What is this medicine? VINBLASTINE (vin BLAS teen) is a chemotherapy drug. It slows the growth of cancer cells. This medicine is used to treat many types of cancer like breast cancer, testicular cancer, Hodgkin's disease, non-Hodgkin's lymphoma, and sarcoma. This medicine may be used for other purposes; ask your health care provider or pharmacist if you have questions. COMMON BRAND NAME(S): Velban What should I tell my health care provider before I take this medicine? They need to know if you have any of these conditions: -blood disorders -dental disease -gout -infection (especially a virus infection such as chickenpox, cold sores, or herpes) -liver disease -lung disease -nervous system disease -recent or ongoing radiation therapy -an unusual or allergic reaction to vinblastine, other chemotherapy agents, other medicines, foods, dyes, or preservatives -pregnant or trying to get pregnant -breast-feeding How should I use this medicine? This drug is given as an infusion into a vein. It is administered in a hospital or clinic by a specially trained health care professional. If you have pain, swelling, burning or any unusual feeling around the site of your injection, tell your health care professional right away. Talk to your pediatrician regarding the use of this medicine in children. While this drug may be prescribed for selected conditions, precautions do apply. Overdosage: If you think you have taken too much of this medicine contact a poison control center or emergency room at once. NOTE: This medicine is only for you. Do  not share this medicine with others. What if I miss a dose? It is important not to miss your dose. Call your doctor or health care professional if you are unable to keep an appointment. What may interact with this medicine? Do not take this medicine with any of the following medications: -erythromycin -itraconazole -mibefradil -voriconazole This medicine may also interact with the following medications: -cyclosporine -fluconazole -ketoconazole -medicines for seizures like phenytoin -medicines to increase blood counts like filgrastim, pegfilgrastim, sargramostim -vaccines -verapamil Talk to your doctor or health care professional before taking any of these medicines: -acetaminophen -aspirin -  ibuprofen -ketoprofen -naproxen This list may not describe all possible interactions. Give your health care provider a list of all the medicines, herbs, non-prescription drugs, or dietary supplements you use. Also tell them if you smoke, drink alcohol, or use illegal drugs. Some items may interact with your medicine. What should I watch for while using this medicine? Your condition will be monitored carefully while you are receiving this medicine. You will need important blood work done while you are taking this medicine. This drug may make you feel generally unwell. This is not uncommon, as chemotherapy can affect healthy cells as well as cancer cells. Report any side effects. Continue your course of treatment even though you feel ill unless your doctor tells you to stop. In some cases, you may be given additional medicines to help with side effects. Follow all directions for their use. Call your doctor or health care professional for advice if you get a fever, chills or sore throat, or other symptoms of a cold or flu. Do not treat yourself. This drug decreases your body's ability to fight infections. Try to avoid being around people who are sick. This medicine may increase your risk to bruise or  bleed. Call your doctor or health care professional if you notice any unusual bleeding. Be careful brushing and flossing your teeth or using a toothpick because you may get an infection or bleed more easily. If you have any dental work done, tell your dentist you are receiving this medicine. Avoid taking products that contain aspirin, acetaminophen, ibuprofen, naproxen, or ketoprofen unless instructed by your doctor. These medicines may hide a fever. Do not become pregnant while taking this medicine. Women should inform their doctor if they wish to become pregnant or think they might be pregnant. There is a potential for serious side effects to an unborn child. Talk to your health care professional or pharmacist for more information. Do not breast-feed an infant while taking this medicine. Men may have a lower sperm count while taking this medicine. Talk to your doctor if you plan to father a child. What side effects may I notice from receiving this medicine? Side effects that you should report to your doctor or health care professional as soon as possible: -allergic reactions like skin rash, itching or hives, swelling of the face, lips, or tongue -low blood counts - This drug may decrease the number of white blood cells, red blood cells and platelets. You may be at increased risk for infections and bleeding. -signs of infection - fever or chills, cough, sore throat, pain or difficulty passing urine -signs of decreased platelets or bleeding - bruising, pinpoint red spots on the skin, black, tarry stools, nosebleeds -signs of decreased red blood cells - unusually weak or tired, fainting spells, lightheadedness -breathing problems -changes in hearing -change in the amount of urine -chest pain -high blood pressure -mouth sores -nausea and vomiting -pain, swelling, redness or irritation at the injection site -pain, tingling, numbness in the hands or feet -problems with balance,  dizziness -seizures Side effects that usually do not require medical attention (report to your doctor or health care professional if they continue or are bothersome): -constipation -hair loss -jaw pain -loss of appetite -sensitivity to light -stomach pain -tumor pain This list may not describe all possible side effects. Call your doctor for medical advice about side effects. You may report side effects to FDA at 1-800-FDA-1088. Where should I keep my medicine? This drug is given in a hospital or clinic and will  not be stored at home. NOTE: This sheet is a summary. It may not cover all possible information. If you have questions about this medicine, talk to your doctor, pharmacist, or health care provider.  2015, [employer] Standard. (2008-05-13 17:15:59) Bleomycin injection What is this medicine? BLEOMYCIN (blee oh MYE sin) is a chemotherapy drug. It is used to treat many kinds of cancer like lymphoma, cervical cancer, head and neck cancer, and testicular cancer. It is also used to prevent and to treat fluid build-up around the lungs caused by some cancers. This medicine may be used for other purposes; ask your health care provider or pharmacist if you have questions. COMMON BRAND NAME(S): Blenoxane What should I tell my health care provider before I take this medicine? They need to know if you have any of these conditions: -cigarette smoker -kidney disease -lung disease -recent or ongoing radiation therapy -an unusual or allergic reaction to bleomycin, other chemotherapy agents, other medicines, foods, dyes, or preservatives -pregnant or trying to get pregnant -breast-feeding How should I use this medicine? This drug is given as an infusion into a vein or a body cavity. It can also be given as an injection into a muscle or under the skin. It is administered in a hospital or clinic by a specially trained health care professional. Talk to your pediatrician regarding the use of this  medicine in children. Special care may be needed. Overdosage: If you think you have taken too much of this medicine contact a poison control center or emergency room at once. NOTE: This medicine is only for you. Do not share this medicine with others. What if I miss a dose? It is important not to miss your dose. Call your doctor or health care professional if you are unable to keep an appointment. What may interact with this medicine? -certain antibiotics given by injection -cisplatin -cyclosporine -diuretics -foscarnet -medicines to increase blood counts like filgrastim, pegfilgrastim, sargramostim -vaccines This list may not describe all possible interactions. Give your health care provider a list of all the medicines, herbs, non-prescription drugs, or dietary supplements you use. Also tell them if you smoke, drink alcohol, or use illegal drugs. Some items may interact with your medicine. What should I watch for while using this medicine? Visit your doctor for checks on your progress. This drug may make you feel generally unwell. This is not uncommon, as chemotherapy can affect healthy cells as well as cancer cells. Report any side effects. Continue your course of treatment even though you feel ill unless your doctor tells you to stop. Call your doctor or health care professional for advice if you get a fever, chills or sore throat, or other symptoms of a cold or flu. Do not treat yourself. This drug decreases your body's ability to fight infections. Try to avoid being around people who are sick. Avoid taking products that contain aspirin, acetaminophen, ibuprofen, naproxen, or ketoprofen unless instructed by your doctor. These medicines may hide a fever. Do not become pregnant while taking this medicine. Women should inform their doctor if they wish to become pregnant or think they might be pregnant. There is a potential for serious side effects to an unborn child. Talk to your health care  professional or pharmacist for more information. Do not breast-feed an infant while taking this medicine. There is a maximum amount of this medicine you should receive throughout your life. The amount depends on the medical condition being treated and your overall health. Your doctor will watch how much  of this medicine you receive in your lifetime. Tell your doctor if you have taken this medicine before. What side effects may I notice from receiving this medicine? Side effects that you should report to your doctor or health care professional as soon as possible: -allergic reactions like skin rash, itching or hives, swelling of the face, lips, or tongue -breathing problems -chest pain -confusion -cough -fast, irregular heartbeat -feeling faint or lightheaded, falls -fever or chills -mouth sores -pain, tingling, numbness in the hands or feet -trouble passing urine or change in the amount of urine -yellowing of the eyes or skin Side effects that usually do not require medical attention (report to your doctor or health care professional if they continue or are bothersome): -darker skin color -hair loss -irritation at site where injected -loss of appetite -nail changes -nausea and vomiting -weight loss This list may not describe all possible side effects. Call your doctor for medical advice about side effects. You may report side effects to FDA at 1-800-FDA-1088. Where should I keep my medicine? This drug is given in a hospital or clinic and will not be stored at home. NOTE: This sheet is a summary. It may not cover all possible information. If you have questions about this medicine, talk to your doctor, pharmacist, or health care provider.  2015, [employer]. (2012-12-12 09:36:48) Dacarbazine, DTIC injection What is this medicine? DACARBAZINE (da KAR ba zeen) is a chemotherapy drug. This medicine is used to treat skin cancer. It is also used with other medicines to treat  Hodgkin's disease. This medicine may be used for other purposes; ask your health care provider or pharmacist if you have questions. COMMON BRAND NAME(S): DTIC-Dome What should I tell my health care provider before I take this medicine? They need to know if you have any of these conditions: -infection (especially virus infection such as chickenpox, cold sores, or herpes) -kidney disease -liver disease -low blood counts like low platelets, red blood cells, white blood cells -recent radiation therapy -an unusual or allergic reaction to dacarbazine, other chemotherapy agents, other medicines, foods, dyes, or preservatives -pregnant or trying to get pregnant -breast-feeding How should I use this medicine? This drug is given as an injection or infusion into a vein. It is administered in a hospital or clinic by a specially trained health care professional. Talk to your pediatrician regarding the use of this medicine in children. While this drug may be prescribed for selected conditions, precautions do apply. Overdosage: If you think you have taken too much of this medicine contact a poison control center or emergency room at once. NOTE: This medicine is only for you. Do not share this medicine with others. What if I miss a dose? It is important not to miss your dose. Call your doctor or health care professional if you are unable to keep an appointment. What may interact with this medicine? -medicines to increase blood counts like filgrastim, pegfilgrastim, sargramostim -vaccines This list may not describe all possible interactions. Give your health care provider a list of all the medicines, herbs, non-prescription drugs, or dietary supplements you use. Also tell them if you smoke, drink alcohol, or use illegal drugs. Some items may interact with your medicine. What should I watch for while using this medicine? Your condition will be monitored carefully while you are receiving this medicine. You  will need important blood work done while you are taking this medicine. This drug may make you feel generally unwell. This is not uncommon, as chemotherapy can  affect healthy cells as well as cancer cells. Report any side effects. Continue your course of treatment even though you feel ill unless your doctor tells you to stop. Call your doctor or health care professional for advice if you get a fever, chills or sore throat, or other symptoms of a cold or flu. Do not treat yourself. This drug decreases your body's ability to fight infections. Try to avoid being around people who are sick. This medicine may increase your risk to bruise or bleed. Call your doctor or health care professional if you notice any unusual bleeding. Be careful brushing and flossing your teeth or using a toothpick because you may get an infection or bleed more easily. If you have any dental work done, tell your dentist you are receiving this medicine. Avoid taking products that contain aspirin, acetaminophen, ibuprofen, naproxen, or ketoprofen unless instructed by your doctor. These medicines may hide a fever. Do not become pregnant while taking this medicine. Women should inform their doctor if they wish to become pregnant or think they might be pregnant. There is a potential for serious side effects to an unborn child. Talk to your health care professional or pharmacist for more information. Do not breast-feed an infant while taking this medicine. What side effects may I notice from receiving this medicine? Side effects that you should report to your doctor or health care professional as soon as possible: -allergic reactions like skin rash, itching or hives, swelling of the face, lips, or tongue -low blood counts - this medicine may decrease the number of white blood cells, red blood cells and platelets. You may be at increased risk for infections and bleeding. -signs of infection - fever or chills, cough, sore throat, pain or  difficulty passing urine -signs of decreased platelets or bleeding - bruising, pinpoint red spots on the skin, black, tarry stools, blood in the urine -signs of decreased red blood cells - unusually weak or tired, fainting spells, lightheadedness -breathing problems -muscle pains -pain at site where injected -trouble passing urine or change in the amount of urine -vomiting -yellowing of the eyes or skin Side effects that usually do not require medical attention (report to your doctor or health care professional if they continue or are bothersome): -diarrhea -hair loss -loss of appetite -nausea -skin more sensitive to sun or ultraviolet light -stomach upset This list may not describe all possible side effects. Call your doctor for medical advice about side effects. You may report side effects to FDA at 1-800-FDA-1088. Where should I keep my medicine? This drug is given in a hospital or clinic and will not be stored at home. NOTE: This sheet is a summary. It may not cover all possible information. If you have questions about this medicine, talk to your doctor, pharmacist, or health care provider.  2015, [employer]. (2007-12-05 16:56:39) Prochlorperazine tablets What is this medicine? PROCHLORPERAZINE (proe klor PER a zeen) helps to control severe nausea and vomiting. This medicine is also used to treat schizophrenia. It can also help patients who experience anxiety that is not due to psychological illness. This medicine may be used for other purposes; ask your health care provider or pharmacist if you have questions. COMMON BRAND NAME(S): Compazine What should I tell my health care provider before I take this medicine? They need to know if you have any of these conditions: -blood disorders or disease -dementia -liver disease or jaundice -Parkinson's disease -uncontrollable movement disorder -an unusual or allergic reaction to prochlorperazine, other medicines, foods,  dyes,  or preservatives -pregnant or trying to get pregnant -breast-feeding How should I use this medicine? Take this medicine by mouth with a glass of water. Follow the directions on the prescription label. Take your doses at regular intervals. Do not take your medicine more often than directed. Do not stop taking this medicine suddenly. This can cause nausea, vomiting, and dizziness. Ask your doctor or health care professional for advice. Talk to your pediatrician regarding the use of this medicine in children. Special care may be needed. While this drug may be prescribed for children as young as 2 years for selected conditions, precautions do apply. Overdosage: If you think you have taken too much of this medicine contact a poison control center or emergency room at once. NOTE: This medicine is only for you. Do not share this medicine with others. What if I miss a dose? If you miss a dose, take it as soon as you can. If it is almost time for your next dose, take only that dose. Do not take double or extra doses. What may interact with this medicine? Do not take this medicine with any of the following medications: -amoxapine -antidepressants like citalopram, escitalopram, fluoxetine, paroxetine, and sertraline -deferoxamine -dofetilide -maprotiline -tricyclic antidepressants like amitriptyline, clomipramine, imipramine, nortiptyline and others This medicine may also interact with the following medications: -lithium -medicines for pain -phenytoin -propranolol -warfarin This list may not describe all possible interactions. Give your health care provider a list of all the medicines, herbs, non-prescription drugs, or dietary supplements you use. Also tell them if you smoke, drink alcohol, or use illegal drugs. Some items may interact with your medicine. What should I watch for while using this medicine? Visit your doctor or health care professional for regular checks on your progress. You may get  drowsy or dizzy. Do not drive, use machinery, or do anything that needs mental alertness until you know how this medicine affects you. Do not stand or sit up quickly, especially if you are an older patient. This reduces the risk of dizzy or fainting spells. Alcohol may interfere with the effect of this medicine. Avoid alcoholic drinks. This medicine can reduce the response of your body to heat or cold. Dress warm in cold weather and stay hydrated in hot weather. If possible, avoid extreme temperatures like saunas, hot tubs, very hot or cold showers, or activities that can cause dehydration such as vigorous exercise. This medicine can make you more sensitive to the sun. Keep out of the sun. If you cannot avoid being in the sun, wear protective clothing and use sunscreen. Do not use sun lamps or tanning beds/booths. Your mouth may get dry. Chewing sugarless gum or sucking hard candy, and drinking plenty of water may help. Contact your doctor if the problem does not go away or is severe. What side effects may I notice from receiving this medicine? Side effects that you should report to your doctor or health care professional as soon as possible: -blurred vision -breast enlargement in men or women -breast milk in women who are not breast-feeding -chest pain, fast or irregular heartbeat -confusion, restlessness -dark yellow or brown urine -difficulty breathing or swallowing -dizziness or fainting spells -drooling, shaking, movement difficulty (shuffling walk) or rigidity -fever, chills, sore throat -involuntary or uncontrollable movements of the eyes, mouth, head, arms, and legs -seizures -stomach area pain -unusually weak or tired -unusual bleeding or bruising -yellowing of skin or eyes Side effects that usually do not require medical attention (report to your  doctor or health care professional if they continue or are bothersome): -difficulty passing urine -difficulty sleeping -headache -sexual  dysfunction -skin rash, or itching This list may not describe all possible side effects. Call your doctor for medical advice about side effects. You may report side effects to FDA at 1-800-FDA-1088. Where should I keep my medicine? Keep out of the reach of children. Store at room temperature between 15 and 30 degrees C (59 and 86 degrees F). Protect from light. Throw away any unused medicine after the expiration date. NOTE: This sheet is a summary. It may not cover all possible information. If you have questions about this medicine, talk to your doctor, pharmacist, or health care provider.  2015, Elsevier/Gold Standard. (2012-01-04 16:59:39) Ondansetron tablets What is this medicine? ONDANSETRON (on DAN se tron) is used to treat nausea and vomiting caused by chemotherapy. It is also used to prevent or treat nausea and vomiting after surgery. This medicine may be used for other purposes; ask your health care provider or pharmacist if you have questions. COMMON BRAND NAME(S): Zofran What should I tell my health care provider before I take this medicine? They need to know if you have any of these conditions: -heart disease -history of irregular heartbeat -liver disease -low levels of magnesium or potassium in the blood -an unusual or allergic reaction to ondansetron, granisetron, other medicines, foods, dyes, or preservatives -pregnant or trying to get pregnant -breast-feeding How should I use this medicine? Take this medicine by mouth with a glass of water. Follow the directions on your prescription label. Take your doses at regular intervals. Do not take your medicine more often than directed. Talk to your pediatrician regarding the use of this medicine in children. Special care may be needed. Overdosage: If you think you have taken too much of this medicine contact a poison control center or emergency room at once. NOTE: This medicine is only for you. Do not share this medicine with  others. What if I miss a dose? If you miss a dose, take it as soon as you can. If it is almost time for your next dose, take only that dose. Do not take double or extra doses. What may interact with this medicine? Do not take this medicine with any of the following medications: -apomorphine -certain medicines for fungal infections like fluconazole, itraconazole, ketoconazole, posaconazole, voriconazole -cisapride -dofetilide -dronedarone -pimozide -thioridazine -ziprasidone This medicine may also interact with the following medications: -carbamazepine -certain medicines for depression, anxiety, or psychotic disturbances -fentanyl -linezolid -MAOIs like Carbex, Eldepryl, Marplan, Nardil, and Parnate -methylene blue (injected into a vein) -other medicines that prolong the QT interval (cause an abnormal heart rhythm) -phenytoin -rifampicin -tramadol This list may not describe all possible interactions. Give your health care provider a list of all the medicines, herbs, non-prescription drugs, or dietary supplements you use. Also tell them if you smoke, drink alcohol, or use illegal drugs. Some items may interact with your medicine. What should I watch for while using this medicine? Check with your doctor or health care professional right away if you have any sign of an allergic reaction. What side effects may I notice from receiving this medicine? Side effects that you should report to your doctor or health care professional as soon as possible: -allergic reactions like skin rash, itching or hives, swelling of the face, lips or tongue -breathing problems -confusion -dizziness -fast or irregular heartbeat -feeling faint or lightheaded, falls -fever and chills -loss of balance or coordination -seizures -sweating -swelling of  the hands or feet -tightness in the chest -tremors -unusually weak or tired Side effects that usually do not require medical attention (report to your doctor  or health care professional if they continue or are bothersome): -constipation or diarrhea -headache This list may not describe all possible side effects. Call your doctor for medical advice about side effects. You may report side effects to FDA at 1-800-FDA-1088. Where should I keep my medicine? Keep out of the reach of children. Store between 2 and 30 degrees C (36 and 86 degrees F). Throw away any unused medicine after the expiration date. NOTE: This sheet is a summary. It may not cover all possible information. If you have questions about this medicine, talk to your doctor, pharmacist, or health care provider.  2015, Elsevier/Gold Standard. (2013-05-23 16:27:45) Lidocaine; Prilocaine cream What is this medicine? LIDOCAINE; PRILOCAINE (LYE doe kane; PRIL oh kane) is a topical anesthetic that causes loss of feeling in the skin and surrounding tissues. It is used to numb the skin before procedures or injections. This medicine may be used for other purposes; ask your health care provider or pharmacist if you have questions. COMMON BRAND NAME(S): EMLA What should I tell my health care provider before I take this medicine? They need to know if you have any of these conditions: -glucose-6-phosphate deficiencies -heart disease -kidney or liver disease -methemoglobinemia -an unusual or allergic reaction to lidocaine, prilocaine, other medicines, foods, dyes, or preservatives -pregnant or trying to get pregnant -breast-feeding How should I use this medicine? This medicine is for external use only on the skin. Do not take by mouth. Follow the directions on the prescription label. Wash hands before and after use. Do not use more or leave in contact with the skin longer than directed. Do not apply to eyes or open wounds. It can cause irritation and blurred or temporary loss of vision. If this medicine comes in contact with your eyes, immediately rinse the eye with water. Do not touch or rub the eye.  Contact your health care provider right away. Talk to your pediatrician regarding the use of this medicine in children. While this medicine may be prescribed for children for selected conditions, precautions do apply. Overdosage: If you think you have taken too much of this medicine contact a poison control center or emergency room at once. NOTE: This medicine is only for you. Do not share this medicine with others. What if I miss a dose? This medicine is usually only applied once prior to each procedure. It must be in contact with the skin for a period of time for it to work. If you applied this medicine later than directed, tell your health care professional before starting the procedure. What may interact with this medicine? -acetaminophen -chloroquine -dapsone -medicines to control heart rhythm -nitrates like nitroglycerin and nitroprusside -other ointments, creams, or sprays that may contain anesthetic medicine -phenobarbital -phenytoin -quinine -sulfonamides like sulfacetamide, sulfamethoxazole, sulfasalazine and others This list may not describe all possible interactions. Give your health care provider a list of all the medicines, herbs, non-prescription drugs, or dietary supplements you use. Also tell them if you smoke, drink alcohol, or use illegal drugs. Some items may interact with your medicine. What should I watch for while using this medicine? Be careful to avoid injury to the treated area while it is numb and you are not aware of pain. Avoid scratching, rubbing, or exposing the treated area to hot or cold temperatures until complete sensation has returned. The numb feeling  will wear off a few hours after applying the cream. What side effects may I notice from receiving this medicine? Side effects that you should report to your doctor or health care professional as soon as possible: -blurred vision -chest pain -difficulty breathing -dizziness -drowsiness -fast or irregular  heartbeat -skin rash or itching -swelling of your throat, lips, or face -trembling Side effects that usually do not require medical attention (report to your doctor or health care professional if they continue or are bothersome): -changes in ability to feel hot or cold -redness and swelling at the application site This list may not describe all possible side effects. Call your doctor for medical advice about side effects. You may report side effects to FDA at 1-800-FDA-1088. Where should I keep my medicine? Keep out of reach of children. Store at room temperature between 15 and 30 degrees C (59 and 86 degrees F). Keep container tightly closed. Throw away any unused medicine after the expiration date. NOTE: This sheet is a summary. It may not cover all possible information. If you have questions about this medicine, talk to your doctor, pharmacist, or health care provider.  2015, Elsevier/Gold Standard. (2008-02-19 17:14:35) Dexamethasone tablets What is this medicine? DEXAMETHASONE (dex a METH a sone) is a corticosteroid. It is commonly used to treat inflammation of the skin, joints, lungs, and other organs. Common conditions treated include asthma, allergies, and arthritis. It is also used for other conditions, such as blood disorders and diseases of the adrenal glands. This medicine may be used for other purposes; ask your health care provider or pharmacist if you have questions. COMMON BRAND NAME(S): Decadron, DexPak Sterling Big, DexPak TaperPak, Zema-Pak What should I tell my health care provider before I take this medicine? They need to know if you have any of these conditions: -Cushing's syndrome -diabetes -glaucoma -heart problems or disease -high blood pressure -infection like herpes, measles, tuberculosis, or chickenpox -kidney disease -liver disease -mental problems -myasthenia gravis -osteoporosis -previous heart attack -seizures -stomach, ulcer or intestine disease  including colitis and diverticulitis -thyroid problem -an unusual or allergic reaction to dexamethasone, corticosteroids, other medicines, lactose, foods, dyes, or preservatives -pregnant or trying to get pregnant -breast-feeding How should I use this medicine? Take this medicine by mouth with a drink of water. Follow the directions on the prescription label. Take it with food or milk to avoid stomach upset. If you are taking this medicine once a day, take it in the morning. Do not take more medicine than you are told to take. Do not suddenly stop taking your medicine because you may develop a severe reaction. Your doctor will tell you how much medicine to take. If your doctor wants you to stop the medicine, the dose may be slowly lowered over time to avoid any side effects. Talk to your pediatrician regarding the use of this medicine in children. Special care may be needed. Patients over 54 years old may have a stronger reaction and need a smaller dose. Overdosage: If you think you have taken too much of this medicine contact a poison control center or emergency room at once. NOTE: This medicine is only for you. Do not share this medicine with others. What if I miss a dose? If you miss a dose, take it as soon as you can. If it is almost time for your next dose, talk to your doctor or health care professional. You may need to miss a dose or take an extra dose. Do not take double or extra  doses without advice. What may interact with this medicine? Do not take this medicine with any of the following medications: -mifepristone, RU-486 -vaccines This medicine may also interact with the following medications: -amphotericin B -antibiotics like clarithromycin, erythromycin, and troleandomycin -aspirin and aspirin-like drugs -barbiturates like phenobarbital -carbamazepine -cholestyramine -cholinesterase inhibitors like donepezil, galantamine, rivastigmine, and  tacrine -cyclosporine -digoxin -diuretics -ephedrine -male hormones, like estrogens or progestins and birth control pills -indinavir -isoniazid -ketoconazole -medicines for diabetes -medicines that improve muscle tone or strength for conditions like myasthenia gravis -NSAIDs, medicines for pain and inflammation, like ibuprofen or naproxen -phenytoin -rifampin -thalidomide -warfarin This list may not describe all possible interactions. Give your health care provider a list of all the medicines, herbs, non-prescription drugs, or dietary supplements you use. Also tell them if you smoke, drink alcohol, or use illegal drugs. Some items may interact with your medicine. What should I watch for while using this medicine? Visit your doctor or health care professional for regular checks on your progress. If you are taking this medicine over a prolonged period, carry an identification card with your name and address, the type and dose of your medicine, and your doctor's name and address. This medicine may increase your risk of getting an infection. Stay away from people who are sick. Tell your doctor or health care professional if you are around anyone with measles or chickenpox. If you are going to have surgery, tell your doctor or health care professional that you have taken this medicine within the last twelve months. Ask your doctor or health care professional about your diet. You may need to lower the amount of salt you eat. The medicine can increase your blood sugar. If you are a diabetic check with your doctor if you need help adjusting the dose of your diabetic medicine. What side effects may I notice from receiving this medicine? Side effects that you should report to your doctor or health care professional as soon as possible: -allergic reactions like skin rash, itching or hives, swelling of the face, lips, or tongue -changes in vision -fever, sore throat, sneezing, cough, or other signs  of infection, wounds that will not heal -increased thirst -mental depression, mood swings, mistaken feelings of self importance or of being mistreated -pain in hips, back, ribs, arms, shoulders, or legs -redness, blistering, peeling or loosening of the skin, including inside the mouth -trouble passing urine or change in the amount of urine -swelling of feet or lower legs -unusual bleeding or bruising Side effects that usually do not require medical attention (report to your doctor or health care professional if they continue or are bothersome): -headache -nausea, vomiting -skin problems, acne, thin and shiny skin -weight gain This list may not describe all possible side effects. Call your doctor for medical advice about side effects. You may report side effects to FDA at 1-800-FDA-1088. Where should I keep my medicine? Keep out of the reach of children. Store at room temperature between 20 and 25 degrees C (68 and 77 degrees F). Protect from light. Throw away any unused medicine after the expiration date. NOTE: This sheet is a summary. It may not cover all possible information. If you have questions about this medicine, talk to your doctor, pharmacist, or health care provider.  2015, Elsevier/Gold Standard. (2007-12-07 14:02:13) Pegfilgrastim injection What is this medicine? PEGFILGRASTIM (peg fil GRA stim) is a long-acting granulocyte colony-stimulating factor that stimulates the growth of neutrophils, a type of white blood cell important in the body's fight against infection.  It is used to reduce the incidence of fever and infection in patients with certain types of cancer who are receiving chemotherapy that affects the bone marrow. This medicine may be used for other purposes; ask your health care provider or pharmacist if you have questions. COMMON BRAND NAME(S): Neulasta What should I tell my health care provider before I take this medicine? They need to know if you have any of these  conditions: -latex allergy -ongoing radiation therapy -sickle cell disease -skin reactions to acrylic adhesives (On-Body Injector only) -an unusual or allergic reaction to pegfilgrastim, filgrastim, other medicines, foods, dyes, or preservatives -pregnant or trying to get pregnant -breast-feeding How should I use this medicine? This medicine is for injection under the skin. If you get this medicine at home, you will be taught how to prepare and give the pre-filled syringe or how to use the On-body Injector. Refer to the patient Instructions for Use for detailed instructions. Use exactly as directed. Take your medicine at regular intervals. Do not take your medicine more often than directed. It is important that you put your used needles and syringes in a special sharps container. Do not put them in a trash can. If you do not have a sharps container, call your pharmacist or healthcare provider to get one. Talk to your pediatrician regarding the use of this medicine in children. Special care may be needed. Overdosage: If you think you have taken too much of this medicine contact a poison control center or emergency room at once. NOTE: This medicine is only for you. Do not share this medicine with others. What if I miss a dose? It is important not to miss your dose. Call your doctor or health care professional if you miss your dose. If you miss a dose due to an On-body Injector failure or leakage, a new dose should be administered as soon as possible using a single prefilled syringe for manual use. What may interact with this medicine? Interactions have not been studied. Give your health care provider a list of all the medicines, herbs, non-prescription drugs, or dietary supplements you use. Also tell them if you smoke, drink alcohol, or use illegal drugs. Some items may interact with your medicine. This list may not describe all possible interactions. Give your health care provider a list of all the  medicines, herbs, non-prescription drugs, or dietary supplements you use. Also tell them if you smoke, drink alcohol, or use illegal drugs. Some items may interact with your medicine. What should I watch for while using this medicine? You may need blood work done while you are taking this medicine. If you are going to need a MRI, CT scan, or other procedure, tell your doctor that you are using this medicine (On-Body Injector only). What side effects may I notice from receiving this medicine? Side effects that you should report to your doctor or health care professional as soon as possible: -allergic reactions like skin rash, itching or hives, swelling of the face, lips, or tongue -dizziness -fever -pain, redness, or irritation at site where injected -pinpoint red spots on the skin -shortness of breath or breathing problems -stomach or side pain, or pain at the shoulder -swelling -tiredness -trouble passing urine Side effects that usually do not require medical attention (report to your doctor or health care professional if they continue or are bothersome): -bone pain -muscle pain This list may not describe all possible side effects. Call your doctor for medical advice about side effects. You may report  side effects to FDA at 1-800-FDA-1088. Where should I keep my medicine? Keep out of the reach of children. Store pre-filled syringes in a refrigerator between 2 and 8 degrees C (36 and 46 degrees F). Do not freeze. Keep in carton to protect from light. Throw away this medicine if it is left out of the refrigerator for more than 48 hours. Throw away any unused medicine after the expiration date. NOTE: This sheet is a summary. It may not cover all possible information. If you have questions about this medicine, talk to your doctor, pharmacist, or health care provider.  2015, Elsevier/Gold Standard. (2013-11-15 16:14:05)

## 2015-02-03 ENCOUNTER — Encounter (HOSPITAL_COMMUNITY): Payer: Self-pay | Admitting: General Surgery

## 2015-02-03 ENCOUNTER — Other Ambulatory Visit (HOSPITAL_COMMUNITY): Payer: Self-pay | Admitting: Hematology & Oncology

## 2015-02-03 ENCOUNTER — Encounter (HOSPITAL_COMMUNITY): Payer: Medicaid Other | Attending: Hematology & Oncology | Admitting: Hematology & Oncology

## 2015-02-03 ENCOUNTER — Inpatient Hospital Stay (HOSPITAL_COMMUNITY): Payer: Self-pay

## 2015-02-03 ENCOUNTER — Encounter (HOSPITAL_COMMUNITY): Payer: Medicaid Other

## 2015-02-03 ENCOUNTER — Telehealth (HOSPITAL_COMMUNITY): Payer: Self-pay

## 2015-02-03 VITALS — BP 134/78 | HR 82 | Temp 98.0°F | Resp 20 | Wt 168.7 lb

## 2015-02-03 DIAGNOSIS — C8190 Hodgkin lymphoma, unspecified, unspecified site: Secondary | ICD-10-CM | POA: Insufficient documentation

## 2015-02-03 DIAGNOSIS — D890 Polyclonal hypergammaglobulinemia: Secondary | ICD-10-CM | POA: Diagnosis not present

## 2015-02-03 DIAGNOSIS — C8110 Nodular sclerosis classical Hodgkin lymphoma, unspecified site: Secondary | ICD-10-CM | POA: Diagnosis not present

## 2015-02-03 NOTE — Patient Instructions (Signed)
[place] at [facility] Discharge Instructions  RECOMMENDATIONS MADE BY THE CONSULTANT AND ANY TEST RESULTS WILL BE SENT TO YOUR REFERRING PHYSICIAN.  Call [facility] with any questions regarding chemo.  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   Start chemo tomorrow.  Thank you for choosing [place] at [facility] to provide your oncology and hematology care.  To afford each patient quality time with our provider, please arrive at least 15 minutes before your scheduled appointment time.    You need to re-schedule your appointment should you arrive 10 or more minutes late.  We strive to give you quality time with our providers, and arriving late affects you and other patients whose appointments are after yours.  Also, if you no show three or more times for appointments you may be dismissed from the clinic at the providers discretion.     Again, thank you for choosing [facility].  Our hope is that these requests will decrease the amount of time that you wait before being seen by our physicians.       _____________________________________________________________  Should you have questions after your visit to [facility], please contact our office at [phone] between the hours of 8:30 a.m. and 4:30 p.m.  Voicemails left after 4:30 p.m. will not be returned until the following business day.  For prescription refill requests, have your pharmacy contact our office.

## 2015-02-03 NOTE — Telephone Encounter (Signed)
Patient has decided to forgo sperm banking.  Plans to proceed with chemotherapy tomorrow.

## 2015-02-03 NOTE — Progress Notes (Signed)
[facility] NOTE  Patient Care Team: No Pcp Per Patient as PCP - General (General Practice)  CHIEF COMPLAINTS/PURPOSE OF CONSULTATION:  L inquinal LN biopsy on 01/13/2015 with final pathology showing Classical Hodgkin Lymphoma, nodular sclerosing type Bone marrow biopsy 01/20/2015 without evidence of malignancy    Hodgkin lymphoma   01/13/2015 Initial Biopsy L inguinal LN biopsy on 01/13/2015 with Classical Hodgkin Lymphoma, nodular sclerosing type   01/20/2015 Bone Marrow Biopsy normal BMBX, mild polyclonal plasmacytosis   01/28/2015 Imaging PET/CT Mild hyper metabolism is associated with the borderline enlarged bilateral axillary and mediastinal lymph nodes. There is more intense FDG uptake associated with bilateral inguinal lymph nodes. focal area of peripheral consolidation in the lingula.   01/29/2015 Imaging MUGA with EF 69%    Imaging 1.spirometry is normal .no change with bronchodilator. lung volumes are normal. airway resistance somewhat high. DLCO normal decreased MIP/MEP may indicate weakness of the bellows function of the lung.      HISTORY OF PRESENTING ILLNESS:  Robert Acevedo 44 y.o. male is here for additional follow-up of newly diagnosed Hodgkin Lymphoma.   On periperal evaluation he had evidence of a polyclonal gammopathy, an abnormal kappa/lambda light chain ratio, elevated ESR at 45, normal CRP, normal CBC and normal LDH. 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  He is here alone today, mentioned his dad offered to bring him in today.  He is curious about staging.   He smokes marijuana 4 times a day to help with his appetite. He would like to continue doing that as he notes it is the only thing that improves his appetite.  He is here to do chemotherapy teaching.  MEDICAL HISTORY:  Past Medical History  Diagnosis Date  . Bronchitis   . Lymphadenopathy, inguinal    left  . Hodgkin's lymphoma     SURGICAL HISTORY: Past Surgical History  Procedure Laterality Date  . Wrist surgery      right  . Knee surgery    . Incision and drainage peritonsillar abscess    . Lymph node biopsy Left 01/13/2015    Procedure: LEFT INGUINAL LYMPH NODE BIOPSY;  Surgeon: Aviva Signs Md, MD;  Location: [facility];  Service: General;  Laterality: Left;  . Bone marrow biopsy Left 01/20/15  . Bone marrow aspiration Left 01/20/15  . Portacath placement Left 01/31/2015    Procedure: INSERTION PORT-A-CATH;  Surgeon: Aviva Signs Md, MD;  Location: [facility];  Service: General;  Laterality: Left;    SOCIAL HISTORY: History   Social History  . Marital Status: Single    Spouse Name: N/A  . Number of Children: N/A  . Years of Education: N/A   Occupational History  . Not on file.   Social History Main Topics  . Smoking status: Current Every Day Smoker -- 0.50 packs/day for 2 years    Types: Cigarettes  . Smokeless tobacco: Never Used  . Alcohol Use: Yes     Comment: occasional  . Drug Use: Yes    Special: Marijuana     Comment: on occasion - last used 01/26/15  . Sexual Activity: Yes    Birth Control/ Protection: None   Other Topics Concern  . Not on file   Social History Narrative  Pt is a smoker starting 2008, pack of cigarettes a week. Infrequent drinking alcohol, maybe one or two once a month. Denies drug use other than daily marijuana.  In jail for 18 months.  Has 3 kids, 22 yo, 28 yo, and 35 yo. Has 1 grandchild. He has a car.    FAMILY HISTORY: Family History  Problem Relation Age of Onset  . Seizures Mother   . Diabetes Father   Father is 55 yo diabetic. Mother is 48 yo. Both still living. 1 brother, 4 sisters. One sister has breast cancer, diagnosed 1996 in her 40s. Another sister has severe depression. He has been with his girlfriend 9-10 years. He lives with his girlfriend and his father. He hasn't told his father about his diagnosis yet. One sister lives in  [place] and she is aware of his diagnosis. He told two of his childhood friends about his diagnosis.   ALLERGIES:  is allergic to clindamycin/lincomycin.  MEDICATIONS:  Current Outpatient Prescriptions  Medication Sig Dispense Refill  . HYDROcodone-acetaminophen (NORCO/VICODIN) 5-325 MG per tablet Take 1-2 tablets by mouth every 4 (four) hours as needed for severe pain. 40 tablet 0  . ketorolac (ACULAR) 0.5 % ophthalmic solution Place 1 drop into both eyes every 6 (six) hours. 3 mL 0  . LORazepam (ATIVAN) 0.5 MG tablet Take 1 tablet (0.5 mg total) by mouth every 8 (eight) hours as needed for anxiety. 30 tablet 1  . bleomycin (BLEOCIN) 15 UNITS injection Inject into the vein once. To be administered Day 1 & Day 15 every 28 days. To begin 02/04/15.    . dacarbazine (DTIC) 200 MG chemo injection Inject into the vein once. To be administered Day 1 & Day 15 every 28 days. To begin 02/04/15.    Marland Kitchen DOXOrubicin HCl (ADRIAMYCIN IV) Inject into the vein. To be administered Day 1 & Day 15 every 28 days. To begin 02/04/15.    . lidocaine-prilocaine (EMLA) cream Apply a quarter size amount to port site 1 hour prior to chemo. Do not rub in. Cover with plastic wrap. (Patient not taking: Reported on 01/27/2015) 30 g 3  . ondansetron (ZOFRAN) 8 MG tablet Take 1 tablet (8 mg total) by mouth every 8 (eight) hours as needed for nausea or vomiting. (Patient not taking: Reported on 01/27/2015) 60 tablet 1  . prochlorperazine (COMPAZINE) 10 MG tablet Take 1 tablet (10 mg total) by mouth every 6 (six) hours as needed for nausea or vomiting. (Patient not taking: Reported on 01/27/2015) 60 tablet 1  . vinBLAStine (VELBAN) 10 MG injection Inject into the vein. To be administered Day 1 & Day 15 every 28 days. To begin 02/04/15.     No current facility-administered medications for this visit.    Review of Systems  Constitutional: Positive for weight loss and diaphoresis. Negative for fever, chills and malaise/fatigue.  HENT:  Negative for congestion, ear discharge, ear pain, hearing loss, nosebleeds, sore throat and tinnitus.   Eyes: Positive for blurred vision. Negative for double vision, photophobia, pain, discharge and redness.  Respiratory: Negative.  Negative for stridor.   Cardiovascular: Negative.   Gastrointestinal: Negative for heartburn, nausea, vomiting, abdominal pain, diarrhea, blood in stool and melena.  Genitourinary: Negative for dysuria, frequency, hematuria and flank pain.  Musculoskeletal: Negative.   Skin: Negative.   Neurological: Negative for dizziness, tingling, tremors, sensory change, speech change, focal weakness, seizures, loss of consciousness and weakness.  Endo/Heme/Allergies: Negative.   Psychiatric/Behavioral: Positive for substance abuse. Negative for depression, suicidal ideas, hallucinations and memory loss. The patient has insomnia. The patient is not nervous/anxious.     14 point ROS was done and is otherwise as detailed above or  in HPI  PHYSICAL EXAMINATION: ECOG PERFORMANCE STATUS: 1 - Symptomatic but completely ambulatory  Filed Vitals:   02/03/15 0930  BP: 134/78  Pulse: 82  Temp: 98 F (36.7 C)  Resp: 20   Filed Weights   02/03/15 0930  Weight: 168 lb 11.2 oz (76.522 kg)   Physical Exam  Constitutional: He is oriented to person, place, and time and well-developed, well-nourished, and in no distress.  HENT:  Head: Normocephalic and atraumatic.  Nose: Nose normal.  Mouth/Throat: Oropharynx is clear and moist. No oropharyngeal exudate.  Eyes: Conjunctivae and EOM are normal. Pupils are equal, round, and reactive to light. Right eye exhibits no discharge. Left eye exhibits no discharge. No scleral icterus.  Neck: Normal range of motion. Neck supple. No tracheal deviation present. No thyromegaly present.  Cardiovascular: Normal rate, regular rhythm and normal heart sounds.  Exam reveals no gallop and no friction rub.   No murmur heard. Pulmonary/Chest: Effort  normal and breath sounds normal. He has no wheezes. He has no rales.  Abdominal: Soft. Bowel sounds are normal. He exhibits no distension and no mass. There is no tenderness. There is no rebound and no guarding.  Genitourinary:   Musculoskeletal: Normal range of motion. He exhibits no edema.   Lymphadenopathy:       Head (right side): No submental, no submandibular, no tonsillar, no preauricular, no posterior auricular and no occipital adenopathy present.       Head (left side): No submental, no submandibular, no tonsillar, no preauricular, no posterior auricular and no occipital adenopathy present.    He has no cervical adenopathy.       Right cervical: No superficial cervical adenopathy present.      Left cervical: No superficial cervical adenopathy present.       Right axillary: No pectoral and no lateral adenopathy present.       Left axillary: No pectoral and no lateral adenopathy present.      Right: No inguinal adenopathy present.       Left: Inguinal adenopathy present.  Surgical incision site with some mild swelling. No redness or drainage. Neurological: He is alert and oriented to person, place, and time. He has normal reflexes. No cranial nerve deficit. Gait normal. Coordination normal.  Skin: Skin is warm and dry. No rash noted.  Psychiatric: Mood, memory, affect and judgment normal.  Nursing note and vitals reviewed.   LABORATORY DATA:  I have reviewed the data as listed Lab Results  Component Value Date   WBC 5.0 02/04/2015   HGB 11.9* 02/04/2015   HCT 36.7* 02/04/2015   MCV 89.5 02/04/2015   PLT 217 02/04/2015   PATHOLOGY:  BMBXIAGNOSIS Diagnosis Bone Marrow, Aspirate,Biopsy, and Clot, left iliac - NORMOCELLULAR BONE MARROW FOR AGE WITH TRILINEAGE HEMATOPOIESIS. - MILD POLYCLONAL PLASMACYTOSIS - NO TUMOR IDENTIFIED. PERIPHERAL BLOOD: - NORMOCYTIC-NORMOCHROMIC ANEMIA. Diagnosis Note There is no morphologic evidence of involvement by Hodgkin lymphoma in this  material. Clinical correlation is recommended. (BNS:ecj 01/22/2015) Susanne Greenhouse MD Pathologist, Electronic Signature  LYMPH NODE BIOPSY Diagnosis Lymph node for lymphoma, left inguinal - CLASSICAL HODGKIN LYMPHOMA, NODULAR SCLEROSIS TYPE. - SEE ONCOLOGY TABLE. Microscopic Comment LYMPHOMA Histologic type: Classical Hodgkin lymphoma, nodular sclerosis type. Grade (if applicable): N/A. Flow cytometry: Not performed. Immunohistochemical stains: CD45, CD20, CD79a, CD3, CD30, and CD15. Touch preps/imprints: Mixed lymphoid population with scattered large cells with bi-/multi-nucleation and prominent nucleoli. Comments: Sections of lymph nodes reveal effacement of the architecture by a nodular lymphoreticular proliferation. There are abundant large cells  with bi-/multi-nucleation and prominent nucleoli, consistent with Hodgkin Reed-Sternberg (HRS) cells. These occur both scattered and in large loose clusters. There are lacunar variants and mummified cells. Focally, there are broad bands of fibrosis creating a more nodular appearance. In these areas the background reveals small lymphocytes and plasma cells with scattered eosinophils and neutrophils, while less fibrotic areas have a more lymphocytic background. The smaller node is only partially involved with areas of residual reactive lymph node. Immunohistochemistry reveals the HRS cells are positive for CD30 and CD15. They are negative for CD20, CD79a, and CD45. CD20 and CD79 highlight residual B-cell areas. CD3 highlights abundant T-cells. The overall findings are consistent with classical Hodgkin lymphoma, nodular sclerosis type. The case was called to Drs. Jenkins and [place] on 01/15/2015. Vicente Males MD Pathologist, Electronic Signature (Case signed 01/15/2015) Specimen Gross and Clinical Information   RADIOGRAPHIC STUDIES:   CLINICAL DATA: Left groin mass for 18 months.  EXAM: CT PELVIS WITH  CONTRAST  TECHNIQUE: Multidetector CT imaging of the pelvis was performed using the standard protocol following the bolus administration of intravenous contrast.  CONTRAST: 140m OMNIPAQUE IOHEXOL 300 MG/ML SOLN  COMPARISON: Ultrasound dated 12/18/2014  FINDINGS: There are multiple enlarged left inguinal lymph nodes. The largest node measures 41 x 22 mm on the coronal image. A slightly more inferior and medial node measures 23 x 16 mm and an external iliac node that measures 17 x 11 mm on the sagittal images.  There are multiple small nodes in the right inguinal region. No other discrete adenopathy is visible in the pelvis.  The patient has numerous lytic lesions in the iliac bones bilaterally some involving the cortex. The largest lesion is in the left ilium measuring 22 mm on the coronal image. There are 2 lytic lesions in the sacrum. Proximal femurs appear normal.  IMPRESSION: 1. Adenopathy in the left groin and involving the left external iliac chain. The adenopathy is nonspecific. 2. Multiple lytic lesions in the iliac bones and sacrum. This appearance is most consistent with multiple myeloma.   Electronically Signed  By: JLorriane ShireM.D.  On: 12/20/2014 22:50   I have personally reviewed the radiological images as listed and agreed with the findings in the report.  ASSESSMENT & PLAN:  Hodgkin Lymphoma, Nodular sclerosing type, Stage III-2A (? involvement in lingula) Abnormal CT of the pelvis with lytic bone disease, not PET avid Polyclonal gammopathy Elevated ESR IPS Score: 5 year freedom from progression and OS of 2; 80% FFP and 91% OS Eye EXAM on 01/29/2015 with possible lymphoma lesion in retina OD (Dr. HIona Hansen  He is doing better today. We reviewed his PET/CT and his BMBX. We discussed staging.   I am uncertain what to make of the bone changes in his pelvis, they are not PET avid. We will follow closely on follow-up imaging. We may need to  consider biopsy in the future. I do not suspect Paget's disease although it could be possible.  He was offered sperm banking but has declined.  Plan is for initiation of ABVD in accordance with NCCN guidelines. We will repeat imaging with PET after 2 cycles.  Additional treatment will be decided in accordance with the PET 5-point scale (deauville criteria) This was explained to the patient as well.   Orders Placed This Encounter  Procedures  . NM Cardiac [place] [facility]    Standing Status: Future     Number of Occurrences:      Standing Expiration Date: 02/03/2016    Order Specific  Question:  Reason for Exam (SYMPTOM  OR DIAGNOSIS REQUIRED)    Answer:  chemotherapy/Hodgkin's lymphoma    Order Specific Question:  Preferred imaging location?    Answer:  [facility]    All questions were answered. The patient knows to call the clinic with any problems, questions or concerns.  This note was electronically signed.    This document serves as a record of services personally performed by Ancil Linsey, MD. It was created on her behalf by Arlyce Harman, a trained medical scribe. The creation of this record is based on the scribe's personal observations and the provider's statements to them. This document has been checked and approved by the attending provider.  I have reviewed the above documentation for accuracy and completeness, and I agree with the above.   Ancil Linsey, MD

## 2015-02-03 NOTE — Progress Notes (Signed)
Chemo teaching done and consent signed for Adriamycin, Vinblastine, Bleomycin, & Dacarbazine. Distress screening done. Sperm banking information given to patient and I also contacted [employer] regarding Sperm Banking because patient is not sure that he is finished having children. Patient tearful today regarding his personal relationship with his girlfriend/ex girlfriend. He is very pleasant. Patient to call me this evening regarding his decision to sperm bank or not. His decision will effect his chemo schedule. Patient received his medications today from our pharmacy (zofran, compazine, emla cream). Patient given masks, gloves, and tegaderms (for port covering). Patient aware that he will see Abby Potash tomorrow during chemo. Chemo calendar given to patient. Chemo card given to patient.

## 2015-02-04 ENCOUNTER — Encounter (HOSPITAL_COMMUNITY): Payer: Self-pay | Admitting: Hematology & Oncology

## 2015-02-04 ENCOUNTER — Encounter (HOSPITAL_BASED_OUTPATIENT_CLINIC_OR_DEPARTMENT_OTHER): Payer: Medicaid Other

## 2015-02-04 ENCOUNTER — Encounter: Payer: Self-pay | Admitting: *Deleted

## 2015-02-04 VITALS — BP 124/73 | HR 78 | Temp 97.8°F | Resp 16 | Wt 168.6 lb

## 2015-02-04 DIAGNOSIS — C8115 Nodular sclerosis classical Hodgkin lymphoma, lymph nodes of inguinal region and lower limb: Secondary | ICD-10-CM | POA: Diagnosis not present

## 2015-02-04 DIAGNOSIS — Z5111 Encounter for antineoplastic chemotherapy: Secondary | ICD-10-CM

## 2015-02-04 DIAGNOSIS — C8190 Hodgkin lymphoma, unspecified, unspecified site: Secondary | ICD-10-CM

## 2015-02-04 LAB — CBC WITH DIFFERENTIAL/PLATELET
Basophils Absolute: 0 10*3/uL (ref 0.0–0.1)
Basophils Relative: 0 % (ref 0–1)
EOS ABS: 0.1 10*3/uL (ref 0.0–0.7)
Eosinophils Relative: 2 % (ref 0–5)
HEMATOCRIT: 36.7 % — AB (ref 39.0–52.0)
Hemoglobin: 11.9 g/dL — ABNORMAL LOW (ref 13.0–17.0)
LYMPHS ABS: 1.8 10*3/uL (ref 0.7–4.0)
LYMPHS PCT: 35 % (ref 12–46)
MCH: 29 pg (ref 26.0–34.0)
MCHC: 32.4 g/dL (ref 30.0–36.0)
MCV: 89.5 fL (ref 78.0–100.0)
Monocytes Absolute: 0.6 10*3/uL (ref 0.1–1.0)
Monocytes Relative: 11 % (ref 3–12)
NEUTROS ABS: 2.5 10*3/uL (ref 1.7–7.7)
Neutrophils Relative %: 52 % (ref 43–77)
Platelets: 217 10*3/uL (ref 150–400)
RBC: 4.1 MIL/uL — ABNORMAL LOW (ref 4.22–5.81)
RDW: 14.1 % (ref 11.5–15.5)
WBC: 5 10*3/uL (ref 4.0–10.5)

## 2015-02-04 LAB — COMPREHENSIVE METABOLIC PANEL
ALBUMIN: 3.3 g/dL — AB (ref 3.5–5.0)
ALT: 18 U/L (ref 17–63)
AST: 22 U/L (ref 15–41)
Alkaline Phosphatase: 44 U/L (ref 38–126)
Anion gap: 6 (ref 5–15)
BILIRUBIN TOTAL: 0.5 mg/dL (ref 0.3–1.2)
BUN: 16 mg/dL (ref 6–20)
CO2: 30 mmol/L (ref 22–32)
CREATININE: 1.13 mg/dL (ref 0.61–1.24)
Calcium: 8.7 mg/dL — ABNORMAL LOW (ref 8.9–10.3)
Chloride: 102 mmol/L (ref 101–111)
GFR calc non Af Amer: >60 mL/min (ref >60)
Glucose, Bld: 115 mg/dL — ABNORMAL HIGH (ref 65–99)
POTASSIUM: 3.8 mmol/L (ref 3.5–5.1)
Sodium: 138 mmol/L (ref 135–145)
Total Protein: 8.5 g/dL — ABNORMAL HIGH (ref 6.5–8.1)

## 2015-02-04 MED ORDER — SODIUM CHLORIDE 0.9 % IV SOLN
1.0000 [IU] | Freq: Once | INTRAVENOUS | Status: AC
  Administered 2015-02-04: 1 [IU] via INTRAVENOUS
  Filled 2015-02-04: qty 0.33

## 2015-02-04 MED ORDER — PALONOSETRON HCL INJECTION 0.25 MG/5ML
INTRAVENOUS | Status: AC
  Filled 2015-02-04: qty 5

## 2015-02-04 MED ORDER — HEPARIN SOD (PORK) LOCK FLUSH 100 UNIT/ML IV SOLN
INTRAVENOUS | Status: AC
  Filled 2015-02-04: qty 5

## 2015-02-04 MED ORDER — PALONOSETRON HCL INJECTION 0.25 MG/5ML
0.2500 mg | Freq: Once | INTRAVENOUS | Status: AC
  Administered 2015-02-04: 0.25 mg via INTRAVENOUS

## 2015-02-04 MED ORDER — SODIUM CHLORIDE 0.9 % IV SOLN
375.0000 mg/m2 | Freq: Once | INTRAVENOUS | Status: AC
  Administered 2015-02-04: 720 mg via INTRAVENOUS
  Filled 2015-02-04: qty 36

## 2015-02-04 MED ORDER — SODIUM CHLORIDE 0.9 % IV SOLN
6.0000 mg/m2 | Freq: Once | INTRAVENOUS | Status: AC
  Administered 2015-02-04: 11.5 mg via INTRAVENOUS
  Filled 2015-02-04: qty 11.5

## 2015-02-04 MED ORDER — SODIUM CHLORIDE 0.9 % IV SOLN
Freq: Once | INTRAVENOUS | Status: AC
  Administered 2015-02-04: 10:00:00 via INTRAVENOUS
  Filled 2015-02-04: qty 5

## 2015-02-04 MED ORDER — HEPARIN SOD (PORK) LOCK FLUSH 100 UNIT/ML IV SOLN
500.0000 [IU] | Freq: Once | INTRAVENOUS | Status: AC | PRN
  Administered 2015-02-04: 500 [IU]

## 2015-02-04 MED ORDER — BLEOMYCIN SULFATE CHEMO INJECTION 30 UNIT
18.0000 [IU] | Freq: Once | INTRAMUSCULAR | Status: AC
  Administered 2015-02-04: 18 [IU] via INTRAVENOUS
  Filled 2015-02-04: qty 6

## 2015-02-04 MED ORDER — SODIUM CHLORIDE 0.9 % IV SOLN
Freq: Once | INTRAVENOUS | Status: AC
  Administered 2015-02-04: 10:00:00 via INTRAVENOUS

## 2015-02-04 MED ORDER — SODIUM CHLORIDE 0.9 % IJ SOLN
10.0000 mL | INTRAMUSCULAR | Status: DC | PRN
  Administered 2015-02-04: 10 mL
  Filled 2015-02-04: qty 10

## 2015-02-04 MED ORDER — DOXORUBICIN HCL CHEMO IV INJECTION 2 MG/ML
25.0000 mg/m2 | Freq: Once | INTRAVENOUS | Status: AC
  Administered 2015-02-04: 48 mg via INTRAVENOUS
  Filled 2015-02-04: qty 24

## 2015-02-04 MED ORDER — SODIUM CHLORIDE 0.9 % IV SOLN: 10.0000 [IU]/m2 | Freq: Once | INTRAVENOUS | Status: DC

## 2015-02-04 NOTE — Progress Notes (Signed)
Tolerated chemo well. 

## 2015-02-04 NOTE — Progress Notes (Signed)
[facility] Psychosocial Distress Screening Clinical Social Work  Clinical Social Work was referred by distress screening protocol.  The patient scored a 10 on the Psychosocial Distress Thermometer which indicates severe distress. Clinical Social Worker met with pt at his first chemo to assess for distress and other psychosocial needs. Pt reports his issue with food is related to appetite not food insecurity. We reviewed his emotions briefly today, as his father was with him in the room today. We discussed ways to decrease anxiety and reviewed Support Programs and resources to further assist him. Pt shared his biggest issue is that him and his girl friend recently broke up. He reports to have good support from his father and extended family that live in the area. CSW reviewed resources to assist in detail and pt aware to reach out to CSW as needed. CSW will check in at upcoming appointments and reassess needs.   ONCBCN DISTRESS SCREENING 02/03/2015  Screening Type Initial Screening  Distress experienced in past week (1-10) 10  Practical problem type Food  Emotional problem type Depression;Nervousness/Anxiety;Adjusting to illness;Isolation/feeling alone;Feeling hopeless;Boredom;Adjusting to appearance changes  Spiritual/Religous concerns type Facing my mortality  Physical Problem type Pain;Loss of appetitie;Tingling hands/feet;Sleep/insomnia;Other (comment)  Physician notified of physical symptoms Yes  Referral to clinical social work Yes  Referral to financial advocate Yes    Clinical Social Worker follow up needed: Yes.    If yes, follow up plan: See above Loren Racer, [place] Worker [place]  [facility] Phone: [phone] Fax: [phone]

## 2015-02-04 NOTE — Patient Instructions (Signed)
[facility] Discharge Instructions for Patients Receiving Chemotherapy  Today you received the following chemotherapy agents Adriamycin,Bleomycin,Vinblastine and Dacarbazine (Cycle 1 Day 1).  To help prevent nausea and vomiting after your treatment, we encourage you to take your nausea medication as instructed. If you develop nausea and vomiting that is not controlled by your nausea medication, call the clinic. If it is after clinic hours your family physician or the after hours number for the clinic or go to the Emergency Department. BELOW ARE SYMPTOMS THAT SHOULD BE REPORTED IMMEDIATELY:  *FEVER GREATER THAN 101.0 F  *CHILLS WITH OR WITHOUT FEVER  NAUSEA AND VOMITING THAT IS NOT CONTROLLED WITH YOUR NAUSEA MEDICATION  *UNUSUAL SHORTNESS OF BREATH  *UNUSUAL BRUISING OR BLEEDING  TENDERNESS IN MOUTH AND THROAT WITH OR WITHOUT PRESENCE OF ULCERS  *URINARY PROBLEMS  *BOWEL PROBLEMS  UNUSUAL RASH Items with * indicate a potential emergency and should be followed up as soon as possible.  One of the nurses will contact you 24 hours after your treatment. Please let the nurse know about any problems that you may have experienced. Feel free to call the clinic you have any questions or concerns. The clinic phone number is [phone]. Return as scheduled.   I have been informed and understand all the instructions given to me. I know to contact the clinic, my physician, or go to the Emergency Department if any problems should occur. I do not have any questions at this time, but understand that I may call the clinic during office hours or the Patient Navigator at [phone] should I have any questions or need assistance in obtaining follow up care.    __________________________________________  _____________  __________ Signature of Patient or Authorized Representative            Date                   Time    __________________________________________ Nurse's  Signature

## 2015-02-05 ENCOUNTER — Telehealth (HOSPITAL_COMMUNITY): Payer: Self-pay | Admitting: Emergency Medicine

## 2015-02-05 NOTE — Telephone Encounter (Signed)
24 hour follow up, he states that he has done well.  No N/V/D.  He did takes some nausea medication this am to help prevent any nausea.

## 2015-02-10 ENCOUNTER — Telehealth (HOSPITAL_COMMUNITY): Payer: Self-pay | Admitting: *Deleted

## 2015-02-10 ENCOUNTER — Encounter (HOSPITAL_COMMUNITY): Payer: Self-pay | Admitting: *Deleted

## 2015-02-10 DIAGNOSIS — C8190 Hodgkin lymphoma, unspecified, unspecified site: Secondary | ICD-10-CM

## 2015-02-10 MED ORDER — CITALOPRAM HYDROBROMIDE 20 MG PO TABS: ORAL_TABLET | ORAL | Status: DC

## 2015-02-10 NOTE — Progress Notes (Signed)
Patient notified that his Celexa was called into [facility] [place] and I gave him the # so that he could call in a credit card #. He said ok and thank you. I told him if he called in a credit card # today he could pick his medication up here @ [facility] on Wednesday when he comes for his appointment with Dr. Whitney Muse. He said ok.

## 2015-02-10 NOTE — Telephone Encounter (Signed)
Patient doing ok. He does state that he is depressed. He would like for [place] to order him something to help with his depression. I will speak with Dr. Whitney Muse.

## 2015-02-12 ENCOUNTER — Encounter (HOSPITAL_COMMUNITY): Payer: Self-pay | Admitting: Hematology & Oncology

## 2015-02-12 ENCOUNTER — Encounter: Payer: Self-pay | Admitting: *Deleted

## 2015-02-12 ENCOUNTER — Encounter (HOSPITAL_BASED_OUTPATIENT_CLINIC_OR_DEPARTMENT_OTHER): Payer: Medicaid Other | Admitting: Hematology & Oncology

## 2015-02-12 VITALS — BP 121/72 | HR 98 | Temp 99.2°F | Resp 18 | Wt 167.3 lb

## 2015-02-12 DIAGNOSIS — C8110 Nodular sclerosis classical Hodgkin lymphoma, unspecified site: Secondary | ICD-10-CM | POA: Diagnosis present

## 2015-02-12 DIAGNOSIS — M899 Disorder of bone, unspecified: Secondary | ICD-10-CM

## 2015-02-12 DIAGNOSIS — Z720 Tobacco use: Secondary | ICD-10-CM

## 2015-02-12 DIAGNOSIS — D890 Polyclonal hypergammaglobulinemia: Secondary | ICD-10-CM

## 2015-02-12 DIAGNOSIS — C8190 Hodgkin lymphoma, unspecified, unspecified site: Secondary | ICD-10-CM

## 2015-02-12 DIAGNOSIS — K219 Gastro-esophageal reflux disease without esophagitis: Secondary | ICD-10-CM

## 2015-02-12 MED ORDER — OMEPRAZOLE 40 MG PO CPDR: 40.0000 mg | DELAYED_RELEASE_CAPSULE | Freq: Every day | ORAL | Status: DC

## 2015-02-12 NOTE — Patient Instructions (Signed)
..  [place] at [facility] Discharge Instructions  RECOMMENDATIONS MADE BY THE CONSULTANT AND ANY TEST RESULTS WILL BE SENT TO YOUR REFERRING PHYSICIAN.  Exam today per Dr. Whitney Muse Return as scheduled Call us if you have uncontrolled nausea or vomiting or any other problems   Thank you for choosing [place] at [facility] to provide your oncology and hematology care.  To afford each patient quality time with our provider, please arrive at least 15 minutes before your scheduled appointment time.    You need to re-schedule your appointment should you arrive 10 or more minutes late.  We strive to give you quality time with our providers, and arriving late affects you and other patients whose appointments are after yours.  Also, if you no show three or more times for appointments you may be dismissed from the clinic at the providers discretion.     Again, thank you for choosing [facility].  Our hope is that these requests will decrease the amount of time that you wait before being seen by our physicians.       _____________________________________________________________  Should you have questions after your visit to [facility], please contact our office at [phone] between the hours of 8:30 a.m. and 4:30 p.m.  Voicemails left after 4:30 p.m. will not be returned until the following business day.  For prescription refill requests, have your pharmacy contact our office.

## 2015-02-12 NOTE — Progress Notes (Signed)
[place] Clinical Social Work  Clinical Social Work was referred by Futures trader for assessment of psychosocial needs due to ongoing concerns with depression due to recent loss of relationship.  Clinical Social Worker met with patient at [facility] after MD visit at length to offer support and assess for needs.  Pt allowed safe space to share his grief related to recent loss of long-term relationship. We discussed coping techniques and possible avenues to help process this event. Pt still hopeful for relationship to continue. We discussed how stress and other life factors can impact on his health and need for ways to de-stress. Pt discussed positive supports in his life as well. Pt aware to start medication as MD prescribed and how to obtain. CSW feels anti-depressant will be very helpful in addition to ongoing supportive counseling by CSW. Pt aware CSW to see pt next week at next visit. CSW suggested pt to try 2-3 new ways to cope between now and next week. Pt was made aware he will need to make arrangements for obtaining medications as [facility] assisted with previous prescriptions while medicaid is pending.    Clinical Social Work interventions: Supportive counseling Resource education  Loren Racer, [place] Tuesdays 8:30-1pm Wednesdays 8:30-12pm  [phone]

## 2015-02-12 NOTE — Progress Notes (Signed)
[facility] NOTE  Patient Care Team: No Pcp Per Patient as PCP - General (General Practice)  CHIEF COMPLAINTS/PURPOSE OF CONSULTATION:  L inquinal LN biopsy on 01/13/2015 with final pathology showing Classical Hodgkin Lymphoma, nodular sclerosing type Bone marrow biopsy 01/20/2015 without evidence of malignancy    Hodgkin lymphoma   01/13/2015 Initial Biopsy L inguinal LN biopsy on 01/13/2015 with Classical Hodgkin Lymphoma, nodular sclerosing type   01/20/2015 Bone Marrow Biopsy normal BMBX, mild polyclonal plasmacytosis   01/28/2015 Imaging PET/CT Mild hyper metabolism is associated with the borderline enlarged bilateral axillary and mediastinal lymph nodes. There is more intense FDG uptake associated with bilateral inguinal lymph nodes. focal area of peripheral consolidation in the lingula.   01/29/2015 Imaging MUGA with EF 69%   01/29/2015 Imaging 1.spirometry is normal .no change with bronchodilator. lung volumes are normal. airway resistance somewhat high. DLCO normal decreased MIP/MEP may indicate weakness of the bellows function of the lung.      HISTORY OF PRESENTING ILLNESS:  Robert Acevedo 44 y.o. male is here for additional follow-up of newly diagnosed Hodgkin Lymphoma.   He is present alone today and likes to be called Robert Acevedo Stomach pain yesterday; says his stomach hurts when he sits still. He point to his upper abdomen. He feels like food makes it better.  He has finished his eye drops that he has been using. He thinks his vision may be better.  He says his mood is all right, "but it gets hard and he tries to keep his mind off of it" He states that his dad is concerned and trying to be supportive, checking on him and letting him know that if he needs him he's there  He denies nausea, vomiting, diarrhea or constipation.  MEDICAL HISTORY:  Past Medical History  Diagnosis Date  . Bronchitis   . Lymphadenopathy, inguinal     left  .  Hodgkin's lymphoma     SURGICAL HISTORY: Past Surgical History  Procedure Laterality Date  . Wrist surgery      right  . Knee surgery    . Incision and drainage peritonsillar abscess    . Lymph node biopsy Left 01/13/2015    Procedure: LEFT INGUINAL LYMPH NODE BIOPSY;  Surgeon: Aviva Signs Md, MD;  Location: [facility];  Service: General;  Laterality: Left;  . Bone marrow biopsy Left 01/20/15  . Bone marrow aspiration Left 01/20/15  . Portacath placement Left 01/31/2015    Procedure: INSERTION PORT-A-CATH;  Surgeon: Aviva Signs Md, MD;  Location: [facility];  Service: General;  Laterality: Left;    SOCIAL HISTORY: History   Social History  . Marital Status: Single    Spouse Name: N/A  . Number of Children: N/A  . Years of Education: N/A   Occupational History  . Not on file.   Social History Main Topics  . Smoking status: Current Every Day Smoker -- 0.50 packs/day for 2 years    Types: Cigarettes  . Smokeless tobacco: Never Used  . Alcohol Use: Yes     Comment: occasional  . Drug Use: Yes    Special: Marijuana     Comment: on occasion - last used 01/26/15  . Sexual Activity: Yes    Birth Control/ Protection: None   Other Topics Concern  . Not on file   Social History Narrative  Pt is a smoker starting 2008, pack of cigarettes a week. Infrequent drinking alcohol, maybe one or two once a  month. Denies drug use other than daily marijuana. In jail for 18 months.  Has 3 kids, 44 yo, 63 yo, and 39 yo. Has 1 grandchild. He has a car.    FAMILY HISTORY: Family History  Problem Relation Age of Onset  . Seizures Mother   . Diabetes Father   Father is 49 yo diabetic. Mother is 82 yo. Both still living. 1 brother, 4 sisters. One sister has breast cancer, diagnosed 1996 in her 83s. Another sister has severe depression. He has been with his girlfriend 9-10 years. He lives with his girlfriend and his father. He hasn't told his father about his diagnosis yet. One sister lives in [place]  and she is aware of his diagnosis. He told two of his childhood friends about his diagnosis.   ALLERGIES:  is allergic to clindamycin/lincomycin.  MEDICATIONS:  Current Outpatient Prescriptions  Medication Sig Dispense Refill  . bleomycin (BLEOCIN) 15 UNITS injection Inject into the vein once. To be administered Day 1 & Day 15 every 28 days. To begin 02/04/15.    . citalopram (CELEXA) 20 MG tablet Take 1 tablet daily x 7 days. Then increase to 2 tablets daily. 49 tablet 0  . dacarbazine (DTIC) 200 MG chemo injection Inject into the vein once. To be administered Day 1 & Day 15 every 28 days. To begin 02/04/15.    Marland Kitchen DOXOrubicin HCl (ADRIAMYCIN IV) Inject into the vein. To be administered Day 1 & Day 15 every 28 days. To begin 02/04/15.    Marland Kitchen HYDROcodone-acetaminophen (NORCO/VICODIN) 5-325 MG per tablet Take 1-2 tablets by mouth every 4 (four) hours as needed for severe pain. 40 tablet 0  . ketorolac (ACULAR) 0.5 % ophthalmic solution Place 1 drop into both eyes every 6 (six) hours. 3 mL 0  . lidocaine-prilocaine (EMLA) cream Apply a quarter size amount to port site 1 hour prior to chemo. Do not rub in. Cover with plastic wrap. 30 g 3  . LORazepam (ATIVAN) 0.5 MG tablet Take 1 tablet (0.5 mg total) by mouth every 8 (eight) hours as needed for anxiety. 30 tablet 1  . ondansetron (ZOFRAN) 8 MG tablet Take 1 tablet (8 mg total) by mouth every 8 (eight) hours as needed for nausea or vomiting. 60 tablet 1  . vinBLAStine (VELBAN) 10 MG injection Inject into the vein. To be administered Day 1 & Day 15 every 28 days. To begin 02/04/15.    Marland Kitchen prochlorperazine (COMPAZINE) 10 MG tablet Take 1 tablet (10 mg total) by mouth every 6 (six) hours as needed for nausea or vomiting. (Patient not taking: Reported on 01/27/2015) 60 tablet 1   No current facility-administered medications for this visit.    Review of Systems  Constitutional: Negative for weight loss and diaphoresis. Negative for fever, chills and  malaise/fatigue.  HENT: Negative for congestion, ear discharge, ear pain, hearing loss, nosebleeds, sore throat and tinnitus.   Eyes: Negative for blurred vision. Negative for double vision, photophobia, pain, discharge and redness.  Respiratory: Negative.  Negative for stridor.   Cardiovascular: Negative.   Gastrointestinal: Negative for heartburn, nausea, vomiting, diarrhea, blood in stool and melena. Positive for abdominal discomfort Genitourinary: Negative for dysuria, frequency, hematuria and flank pain.  Musculoskeletal: Negative.   Skin: Negative.   Neurological: Negative for dizziness, tingling, tremors, sensory change, speech change, focal weakness, seizures, loss of consciousness and weakness.  Endo/Heme/Allergies: Negative.   Psychiatric/Behavioral: Positive for substance abuse. Negative for depression, suicidal ideas, hallucinations and memory loss. The patient has insomnia. The  patient is not nervous/anxious.     14 point ROS was done and is otherwise as detailed above or in HPI   PHYSICAL EXAMINATION: ECOG PERFORMANCE STATUS: 1 - Symptomatic but completely ambulatory  Filed Vitals:   02/12/15 0826  BP: 121/72  Pulse: 98  Temp: 99.2 F (37.3 C)  Resp: 18   Filed Weights   02/12/15 0826  Weight: 167 lb 4.8 oz (75.887 kg)   Physical Exam  Constitutional: He is oriented to person, place, and time and well-developed, well-nourished, and in no distress.  HENT:  Head: Normocephalic and atraumatic.  Nose: Nose normal.  Mouth/Throat: Oropharynx is clear and moist. No oropharyngeal exudate.  Eyes: Conjunctivae and EOM are normal. Pupils are equal, round, and reactive to light. Right eye exhibits no discharge. Left eye exhibits no discharge. No scleral icterus.  Neck: Normal range of motion. Neck supple. No tracheal deviation present. No thyromegaly present.  Cardiovascular: Normal rate, regular rhythm and normal heart sounds.  Exam reveals no gallop and no friction rub.     No murmur heard. Pulmonary/Chest: Effort normal and breath sounds normal. He has no wheezes. He has no rales.  Abdominal: Soft. Bowel sounds are normal. He exhibits no distension and no mass. There is no tenderness. There is no rebound and no guarding.  Genitourinary:   Musculoskeletal: Normal range of motion. He exhibits no edema.   Neurological: He is alert and oriented to person, place, and time. He has normal reflexes. No cranial nerve deficit. Gait normal. Coordination normal.  Skin: Skin is warm and dry. No rash noted.  Psychiatric: Mood, memory, affect and judgment normal.  Nursing note and vitals reviewed.   LABORATORY DATA:  I have reviewed the data as listed Lab Results  Component Value Date   WBC 5.0 02/04/2015   HGB 11.9* 02/04/2015   HCT 36.7* 02/04/2015   MCV 89.5 02/04/2015   PLT 217 02/04/2015   PATHOLOGY:  BMBXIAGNOSIS Diagnosis Bone Marrow, Aspirate,Biopsy, and Clot, left iliac - NORMOCELLULAR BONE MARROW FOR AGE WITH TRILINEAGE HEMATOPOIESIS. - MILD POLYCLONAL PLASMACYTOSIS - NO TUMOR IDENTIFIED. PERIPHERAL BLOOD: - NORMOCYTIC-NORMOCHROMIC ANEMIA. Diagnosis Note There is no morphologic evidence of involvement by Hodgkin lymphoma in this material. Clinical correlation is recommended. (BNS:ecj 01/22/2015) Susanne Greenhouse MD Pathologist, Electronic Signature  LYMPH NODE BIOPSY Diagnosis Lymph node for lymphoma, left inguinal - CLASSICAL HODGKIN LYMPHOMA, NODULAR SCLEROSIS TYPE. - SEE ONCOLOGY TABLE. Microscopic Comment LYMPHOMA Histologic type: Classical Hodgkin lymphoma, nodular sclerosis type. Grade (if applicable): N/A. Flow cytometry: Not performed. Immunohistochemical stains: CD45, CD20, CD79a, CD3, CD30, and CD15. Touch preps/imprints: Mixed lymphoid population with scattered large cells with bi-/multi-nucleation and prominent nucleoli. Comments: Sections of lymph nodes reveal effacement of the architecture by a nodular lymphoreticular  proliferation. There are abundant large cells with bi-/multi-nucleation and prominent nucleoli, consistent with Hodgkin Reed-Sternberg (HRS) cells. These occur both scattered and in large loose clusters. There are lacunar variants and mummified cells. Focally, there are broad bands of fibrosis creating a more nodular appearance. In these areas the background reveals small lymphocytes and plasma cells with scattered eosinophils and neutrophils, while less fibrotic areas have a more lymphocytic background. The smaller node is only partially involved with areas of residual reactive lymph node. Immunohistochemistry reveals the HRS cells are positive for CD30 and CD15. They are negative for CD20, CD79a, and CD45. CD20 and CD79 highlight residual B-cell areas. CD3 highlights abundant T-cells. The overall findings are consistent with classical Hodgkin lymphoma, nodular sclerosis type. The case was called  to Drs. Jenkins and [place] on 01/15/2015. Vicente Males MD Pathologist, Electronic Signature (Case signed 01/15/2015) Specimen Gross and Clinical Information   RADIOGRAPHIC STUDIES:   CLINICAL DATA: Left groin mass for 18 months.  EXAM: CT PELVIS WITH CONTRAST  TECHNIQUE: Multidetector CT imaging of the pelvis was performed using the standard protocol following the bolus administration of intravenous contrast.  CONTRAST: 164m OMNIPAQUE IOHEXOL 300 MG/ML SOLN  COMPARISON: Ultrasound dated 12/18/2014  FINDINGS: There are multiple enlarged left inguinal lymph nodes. The largest node measures 41 x 22 mm on the coronal image. A slightly more inferior and medial node measures 23 x 16 mm and an external iliac node that measures 17 x 11 mm on the sagittal images.  There are multiple small nodes in the right inguinal region. No other discrete adenopathy is visible in the pelvis.  The patient has numerous lytic lesions in the iliac bones bilaterally some involving the cortex. The  largest lesion is in the left ilium measuring 22 mm on the coronal image. There are 2 lytic lesions in the sacrum. Proximal femurs appear normal.  IMPRESSION: 1. Adenopathy in the left groin and involving the left external iliac chain. The adenopathy is nonspecific. 2. Multiple lytic lesions in the iliac bones and sacrum. This appearance is most consistent with multiple myeloma.   Electronically Signed  By: JLorriane ShireM.D.  On: 12/20/2014 22:50   I have personally reviewed the radiological images as listed and agreed with the findings in the report.  ASSESSMENT & PLAN:  Hodgkin Lymphoma, Nodular sclerosing type, Stage III-2A (? involvement in lingula) Abnormal CT of the pelvis with lytic bone disease, not PET avid Polyclonal gammopathy Elevated ESR IPS Score: 5 year freedom from progression and OS of 2; 80% FFP and 91% OS Eye EXAM on 01/29/2015 with possible lymphoma lesion in retina OD (Dr. HIona Hansen  I spoke with Dr. HIona Hansenyesterday and he states he is not really sure what he sees on the patient's eye exam. I will discuss with radiology appropriate imaging as we move forward with therapy.  The patient also has repeat f/u with Dr. HIona Hansen  I am uncertain what to make of the bone changes in his pelvis, they are not PET avid. We will follow closely on follow-up imaging. We may need to consider biopsy in the future. I do not suspect Paget's disease although it could be possible.  I have called him in a PPI to take daily. He understands. He is to notify [place] his symptoms do not improve.  I reviewed chemotherapy, neutropenia and fever in detail. He was given a sheet with additional information.  Plan is for continuation of ABVD in accordance with NCCN guidelines. We will repeat imaging with PET after 2 cycles.  Additional treatment will be decided in accordance with the PET 5-point scale (deauville criteria)   All questions were answered. The patient knows to call the clinic  with any problems, questions or concerns.   This note was electronically signed.    This document serves as a record of services personally performed by SAncil Linsey MD. It was created on her behalf by DJanace Hoard a trained medical scribe. The creation of this record is based on the scribe's personal observations and the provider's statements to them. This document has been checked and approved by the attending provider.  I have reviewed the above documentation for accuracy and completeness, and I agree with the above.  SKelby Fam PWhitney Muse MD

## 2015-02-18 ENCOUNTER — Encounter: Payer: Self-pay | Admitting: *Deleted

## 2015-02-18 ENCOUNTER — Encounter (HOSPITAL_BASED_OUTPATIENT_CLINIC_OR_DEPARTMENT_OTHER): Payer: Medicaid Other

## 2015-02-18 ENCOUNTER — Ambulatory Visit (HOSPITAL_COMMUNITY): Payer: Self-pay | Admitting: Hematology & Oncology

## 2015-02-18 ENCOUNTER — Encounter (HOSPITAL_BASED_OUTPATIENT_CLINIC_OR_DEPARTMENT_OTHER): Payer: Medicaid Other | Admitting: Oncology

## 2015-02-18 VITALS — BP 102/58 | HR 76 | Temp 97.9°F | Resp 18 | Wt 169.8 lb

## 2015-02-18 DIAGNOSIS — C8115 Nodular sclerosis classical Hodgkin lymphoma, lymph nodes of inguinal region and lower limb: Secondary | ICD-10-CM

## 2015-02-18 DIAGNOSIS — C8190 Hodgkin lymphoma, unspecified, unspecified site: Secondary | ICD-10-CM

## 2015-02-18 DIAGNOSIS — Z5111 Encounter for antineoplastic chemotherapy: Secondary | ICD-10-CM | POA: Diagnosis present

## 2015-02-18 LAB — CBC WITH DIFFERENTIAL/PLATELET
BASOS ABS: 0 10*3/uL (ref 0.0–0.1)
Basophils Relative: 0 % (ref 0–1)
Eosinophils Absolute: 0 10*3/uL (ref 0.0–0.7)
Eosinophils Relative: 2 % (ref 0–5)
HEMATOCRIT: 34.9 % — AB (ref 39.0–52.0)
HEMOGLOBIN: 11.4 g/dL — AB (ref 13.0–17.0)
LYMPHS PCT: 54 % — AB (ref 12–46)
Lymphs Abs: 1.2 10*3/uL (ref 0.7–4.0)
MCH: 28.9 pg (ref 26.0–34.0)
MCHC: 32.7 g/dL (ref 30.0–36.0)
MCV: 88.6 fL (ref 78.0–100.0)
Monocytes Absolute: 0.4 10*3/uL (ref 0.1–1.0)
Monocytes Relative: 20 % — ABNORMAL HIGH (ref 3–12)
NEUTROS ABS: 0.5 10*3/uL — AB (ref 1.7–7.7)
Neutrophils Relative %: 24 % — ABNORMAL LOW (ref 43–77)
Platelets: 214 10*3/uL (ref 150–400)
RBC: 3.94 MIL/uL — ABNORMAL LOW (ref 4.22–5.81)
RDW: 13.6 % (ref 11.5–15.5)
WBC: 2.2 10*3/uL — AB (ref 4.0–10.5)

## 2015-02-18 LAB — COMPREHENSIVE METABOLIC PANEL
ALK PHOS: 39 U/L (ref 38–126)
ALT: 30 U/L (ref 17–63)
ANION GAP: 7 (ref 5–15)
AST: 32 U/L (ref 15–41)
Albumin: 3.4 g/dL — ABNORMAL LOW (ref 3.5–5.0)
BILIRUBIN TOTAL: 0.4 mg/dL (ref 0.3–1.2)
BUN: 14 mg/dL (ref 6–20)
CO2: 29 mmol/L (ref 22–32)
Calcium: 8.3 mg/dL — ABNORMAL LOW (ref 8.9–10.3)
Chloride: 100 mmol/L — ABNORMAL LOW (ref 101–111)
Creatinine, Ser: 1.05 mg/dL (ref 0.61–1.24)
GFR calc Af Amer: >60 mL/min (ref >60)
GLUCOSE: 130 mg/dL — AB (ref 65–99)
Potassium: 3.6 mmol/L (ref 3.5–5.1)
SODIUM: 136 mmol/L (ref 135–145)
Total Protein: 8.3 g/dL — ABNORMAL HIGH (ref 6.5–8.1)

## 2015-02-18 MED ORDER — PALONOSETRON HCL INJECTION 0.25 MG/5ML
0.2500 mg | Freq: Once | INTRAVENOUS | Status: AC
  Administered 2015-02-18: 0.25 mg via INTRAVENOUS
  Filled 2015-02-18: qty 5

## 2015-02-18 MED ORDER — HEPARIN SOD (PORK) LOCK FLUSH 100 UNIT/ML IV SOLN
500.0000 [IU] | Freq: Once | INTRAVENOUS | Status: AC | PRN
  Administered 2015-02-18: 500 [IU]
  Filled 2015-02-18: qty 5

## 2015-02-18 MED ORDER — DACARBAZINE 200 MG IV SOLR
375.0000 mg/m2 | Freq: Once | INTRAVENOUS | Status: AC
  Administered 2015-02-18: 720 mg via INTRAVENOUS
  Filled 2015-02-18: qty 36

## 2015-02-18 MED ORDER — SODIUM CHLORIDE 0.9 % IV SOLN
10.0000 [IU]/m2 | Freq: Once | INTRAVENOUS | Status: AC
  Administered 2015-02-18: 19 [IU] via INTRAVENOUS
  Filled 2015-02-18: qty 6.33

## 2015-02-18 MED ORDER — DOXORUBICIN HCL CHEMO IV INJECTION 2 MG/ML
25.0000 mg/m2 | Freq: Once | INTRAVENOUS | Status: AC
  Administered 2015-02-18: 48 mg via INTRAVENOUS
  Filled 2015-02-18: qty 24

## 2015-02-18 MED ORDER — SODIUM CHLORIDE 0.9 % IV SOLN
Freq: Once | INTRAVENOUS | Status: AC
  Administered 2015-02-18: 10:00:00 via INTRAVENOUS

## 2015-02-18 MED ORDER — VINBLASTINE SULFATE CHEMO INJECTION 1 MG/ML
6.0000 mg/m2 | Freq: Once | INTRAVENOUS | Status: AC
  Administered 2015-02-18: 11.5 mg via INTRAVENOUS
  Filled 2015-02-18: qty 11.5

## 2015-02-18 MED ORDER — SODIUM CHLORIDE 0.9 % IJ SOLN
10.0000 mL | INTRAMUSCULAR | Status: DC | PRN
  Administered 2015-02-18: 10 mL
  Filled 2015-02-18: qty 10

## 2015-02-18 MED ORDER — SODIUM CHLORIDE 0.9 % IV SOLN
Freq: Once | INTRAVENOUS | Status: AC
  Administered 2015-02-18: 11:00:00 via INTRAVENOUS
  Filled 2015-02-18: qty 5

## 2015-02-18 NOTE — Patient Instructions (Signed)
[facility] Discharge Instructions for Patients Receiving Chemotherapy  Today you received the following chemotherapy agents adriamycin,bleomycin,vinblastin and dacarbazine (cycle 1 day 15).  To help prevent nausea and vomiting after your treatment, we encourage you to take your nausea medication as instructed. If you develop nausea and vomiting that is not controlled by your nausea medication, call the clinic. If it is after clinic hours your family physician or the after hours number for the clinic or go to the Emergency Department. BELOW ARE SYMPTOMS THAT SHOULD BE REPORTED IMMEDIATELY:  *FEVER GREATER THAN 101.0 F  *CHILLS WITH OR WITHOUT FEVER  NAUSEA AND VOMITING THAT IS NOT CONTROLLED WITH YOUR NAUSEA MEDICATION  *UNUSUAL SHORTNESS OF BREATH  *UNUSUAL BRUISING OR BLEEDING  TENDERNESS IN MOUTH AND THROAT WITH OR WITHOUT PRESENCE OF ULCERS  *URINARY PROBLEMS  *BOWEL PROBLEMS  UNUSUAL RASH Items with * indicate a potential emergency and should be followed up as soon as possible. Neutropenia Neutropenia is a condition that occurs when the level of a certain type of white blood cell (neutrophil) in your body becomes lower than normal. Neutrophils are made in the bone marrow and fight infections. These cells protect against bacteria and viruses. The fewer neutrophils you have, and the longer your body remains without them, the greater your risk of getting a severe infection becomes. CAUSES  The cause of neutropenia may be hard to determine. However, it is usually due to 3 main problems:   Decreased production of neutrophils. This may be due to:  Certain medicines such as chemotherapy.  Genetic problems.  Cancer.  Radiation treatments.  Vitamin deficiency.  Some pesticides.  Increased destruction of neutrophils. This may be due to:  Overwhelming infections.  Hemolytic anemia. This is when the body destroys its own blood  cells.  Chemotherapy.  Neutrophils moving to areas of the body where they cannot fight infections. This may be due to:  Dialysis procedures.  Conditions where the spleen becomes enlarged. Neutrophils are held in the spleen and are not available to the rest of the body.  Overwhelming infections. The neutrophils are held in the area of the infection and are not available to the rest of the body. SYMPTOMS  There are no specific symptoms of neutropenia. The lack of neutrophils can result in an infection, and an infection can cause various problems. DIAGNOSIS  Diagnosis is made by a blood test. A complete blood count is performed. The normal level of neutrophils in human blood differs with age and race. Infants have lower counts than older children and adults. African Americans have lower counts than Caucasians or Asians. The average adult level is 1500 cells/mm3 of blood. Neutrophil counts are interpreted as follows:  Greater than 1000 cells/mm3 gives normal protection against infection.  500 to 1000 cells/mm3 gives an increased risk for infection.  200 to 500 cells/mm3 is a greater risk for severe infection.  Lower than 200 cells/mm3 is a marked risk of infection. This may require hospitalization and treatment with antibiotic medicines. TREATMENT  Treatment depends on the underlying cause, severity, and presence of infections or symptoms. It also depends on your health. Your caregiver will discuss the treatment plan with you. Mild cases are often easily treated and have a good outcome. Preventative measures may also be started to limit your risk of infections. Treatment can include:  Taking antibiotics.  Stopping medicines that are known to cause neutropenia.  Correcting nutritional deficiencies by eating green vegetables to supply folic acid and taking vitamin B  supplements.  Stopping exposure to pesticides if your neutropenia is related to pesticide exposure.  Taking a blood growth  factor called sargramostim, pegfilgrastim, or filgrastim if you are undergoing chemotherapy for cancer. This stimulates white blood cell production.  Removal of the spleen if you have Felty's syndrome and have repeated infections. HOME CARE INSTRUCTIONS   Follow your caregiver's instructions about when you need to have blood work done.  Wash your hands often. Make sure others who come in contact with you also wash their hands.  Wash raw fruits and vegetables before eating them. They can carry bacteria and fungi.  Avoid people with colds or spreadable (contagious) diseases (chickenpox, herpes zoster, influenza).  Avoid large crowds.  Avoid construction areas. The dust can release fungus into the air.  Be cautious around children in daycare or school environments.  Take care of your respiratory system by coughing and deep breathing.  Bathe daily.  Protect your skin from cuts and burns.  Do not work in the garden or with flowers and plants.  Care for the mouth before and after meals by brushing with a soft toothbrush. If you have mucositis, do not use mouthwash. Mouthwash contains alcohol and can dry out the mouth even more.  Clean the area between the genitals and the anus (perineal area) after urination and bowel movements. Women need to wipe from front to back.  Use a water soluble lubricant during sexual intercourse and practice good hygiene after. Do not have intercourse if you are severely neutropenic. Check with your caregiver for guidelines.  Exercise daily as tolerated.  Avoid people who were vaccinated with a live vaccine in the past 30 days. You should not receive live vaccines (polio, typhoid).  Do not provide direct care for pets. Avoid animal droppings. Do not clean litter boxes and bird cages.  Do not share food utensils.  Do not use tampons, enemas, or rectal suppositories unless directed by your caregiver.  Use an electric razor to remove hair.  Wash your  hands after handling magazines, letters, and newspapers. SEEK IMMEDIATE MEDICAL CARE IF:   You have a fever.  You have chills or start to shake.  You feel nauseous or vomit.  You develop mouth sores.  You develop aches and pains.  You have redness and swelling around open wounds.  Your skin is warm to the touch.  You have pus coming from your wounds.  You develop swollen lymph nodes.  You feel weak or fatigued.  You develop red streaks on the skin. MAKE SURE YOU:  Understand these instructions.  Will watch your condition.  Will get help right away if you are not doing well or get worse. Document Released: 02/05/2002 Document Revised: 11/08/2011 Document Reviewed: 03/05/2011  [facility] Patient Information 2015 [place], [place]. This information is not intended to replace advice given to you by your health care provider. Make sure you discuss any questions you have with your health care provider.  Return as scheduled.  I have been informed and understand all the instructions given to me. I know to contact the clinic, my physician, or go to the Emergency Department if any problems should occur. I do not have any questions at this time, but understand that I may call the clinic during office hours or the Patient Navigator at [phone] should I have any questions or need assistance in obtaining follow up care.    __________________________________________  _____________  __________ Signature of Patient or Authorized Representative  Date                   Time    __________________________________________ Nurse's Signature

## 2015-02-18 NOTE — Assessment & Plan Note (Addendum)
Classical hodgkin's lymphoma, nodular sclerosing type with negative bone marrow aspiration and biopsy on 01/20/2015.  He began ABVD systemic chemotherapy on 02/04/2015.   Presenting today for Day 15 of cycle 1 today.  Pre-treatment labs performed.  I recommended Neosporin (or similar) with a Band-Aid to the superior area of his surgical scar since he is having issues with it healing due to minor trauma from his waistband.  I have reiterated good hand hygiene and wearing sunscreen.   I have answered he and his family's questions today.  Return in 2 weeks for follow-up and the embark on cycle 2 of chemotherapy.

## 2015-02-18 NOTE — Progress Notes (Signed)
Labs reviewed by Dr.Penland. Order to treat with Rhome 500. Tolerated chemo well.

## 2015-02-18 NOTE — Progress Notes (Signed)
No PCP Per Patient No address on file  Hodgkin lymphoma  CURRENT THERAPY: ABVD beginning on 02/04/2015.  INTERVAL HISTORY: Robert Acevedo 44 y.o. male returns for followup of classical hodgkin's lymphoma, nodular sclerosing type with negative bone marrow aspiration and biopsy on 01/20/2015.    Hodgkin lymphoma   01/13/2015 Initial Biopsy L inguinal LN biopsy on 01/13/2015 with Classical Hodgkin Lymphoma, nodular sclerosing type   01/20/2015 Bone Marrow Biopsy normal BMBX, mild polyclonal plasmacytosis   01/28/2015 Imaging PET/CT Mild hyper metabolism is associated with the borderline enlarged bilateral axillary and mediastinal lymph nodes. There is more intense FDG uptake associated with bilateral inguinal lymph nodes. focal area of peripheral consolidation in the lingula.   01/29/2015 Imaging MUGA with EF 69%   01/29/2015 Imaging 1.spirometry is normal .no change with bronchodilator. lung volumes are normal. airway resistance somewhat high. DLCO normal decreased MIP/MEP may indicate weakness of the bellows function of the lung.    02/04/2015 -  Chemotherapy ABVD   I personally reviewed and went over laboratory results with the patient.  The results are noted within this dictation.  I have reviewed hand hygiene and wearing sunscreen.  I have reviewed diet and eating.  Robert Acevedo had a number of simple yet important questions that were answered for him.  He notes some fatigue and I have provided him education regarding treatment-induced fatigue.  He is encouraged to remain active, but understand he may need breaks throughout the day to rest.  Past Medical History  Diagnosis Date  . Bronchitis   . Lymphadenopathy, inguinal     left  . Hodgkin's lymphoma     has Lytic bone lesion of hip; Abnormal presence of protein in urine; and Hodgkin lymphoma on his problem list.     is allergic to clindamycin/lincomycin.  Current Outpatient Prescriptions on File Prior to Visit    Medication Sig Dispense Refill  . bleomycin (BLEOCIN) 15 UNITS injection Inject into the vein once. To be administered Day 1 & Day 15 every 28 days. To begin 02/04/15.    . citalopram (CELEXA) 20 MG tablet Take 1 tablet daily x 7 days. Then increase to 2 tablets daily. 49 tablet 0  . dacarbazine (DTIC) 200 MG chemo injection Inject into the vein once. To be administered Day 1 & Day 15 every 28 days. To begin 02/04/15.    Marland Kitchen DOXOrubicin HCl (ADRIAMYCIN IV) Inject into the vein. To be administered Day 1 & Day 15 every 28 days. To begin 02/04/15.    Marland Kitchen HYDROcodone-acetaminophen (NORCO/VICODIN) 5-325 MG per tablet Take 1-2 tablets by mouth every 4 (four) hours as needed for severe pain. 40 tablet 0  . ketorolac (ACULAR) 0.5 % ophthalmic solution Place 1 drop into both eyes every 6 (six) hours. 3 mL 0  . lidocaine-prilocaine (EMLA) cream Apply a quarter size amount to port site 1 hour prior to chemo. Do not rub in. Cover with plastic wrap. 30 g 3  . LORazepam (ATIVAN) 0.5 MG tablet Take 1 tablet (0.5 mg total) by mouth every 8 (eight) hours as needed for anxiety. 30 tablet 1  . omeprazole (PRILOSEC) 40 MG capsule Take 1 capsule (40 mg total) by mouth daily. (Patient not taking: Reported on 02/18/2015) 30 capsule 3  . ondansetron (ZOFRAN) 8 MG tablet Take 1 tablet (8 mg total) by mouth every 8 (eight) hours as needed for nausea or vomiting. (Patient not taking: Reported on 02/18/2015) 60 tablet 1  . prochlorperazine (  COMPAZINE) 10 MG tablet Take 1 tablet (10 mg total) by mouth every 6 (six) hours as needed for nausea or vomiting. (Patient not taking: Reported on 02/18/2015) 60 tablet 1  . vinBLAStine (VELBAN) 10 MG injection Inject into the vein. To be administered Day 1 & Day 15 every 28 days. To begin 02/04/15.     Current Facility-Administered Medications on File Prior to Visit  Medication Dose Route Frequency Provider Last Rate Last Dose  . bleomycin (BLEOCIN) 19 Units in sodium chloride 0.9 % 50 mL chemo  infusion  10 Units/m2 (Treatment Plan Actual) Intravenous Once Patrici Ranks, MD      . dacarbazine (DTIC) 720 mg in sodium chloride 0.9 % 250 mL chemo infusion  375 mg/m2 (Treatment Plan Actual) Intravenous Once Patrici Ranks, MD      . DOXOrubicin (ADRIAMYCIN) chemo injection 48 mg  25 mg/m2 (Treatment Plan Actual) Intravenous Once Patrici Ranks, MD      . heparin lock flush 100 unit/mL  500 Units Intracatheter Once PRN Patrici Ranks, MD      . sodium chloride 0.9 % injection 10 mL  10 mL Intracatheter PRN Patrici Ranks, MD   10 mL at 02/18/15 1015  . vinBLAStine (VELBAN) 11.5 mg in sodium chloride 0.9 % 50 mL chemo infusion  6 mg/m2 (Treatment Plan Actual) Intravenous Once Patrici Ranks, MD        Past Surgical History  Procedure Laterality Date  . Wrist surgery      right  . Knee surgery    . Incision and drainage peritonsillar abscess    . Lymph node biopsy Left 01/13/2015    Procedure: LEFT INGUINAL LYMPH NODE BIOPSY;  Surgeon: Aviva Signs Md, MD;  Location: [facility];  Service: General;  Laterality: Left;  . Bone marrow biopsy Left 01/20/15  . Bone marrow aspiration Left 01/20/15  . Portacath placement Left 01/31/2015    Procedure: INSERTION PORT-A-CATH;  Surgeon: Aviva Signs Md, MD;  Location: [facility];  Service: General;  Laterality: Left;    Denies any headaches, dizziness, double vision, fevers, chills, night sweats, nausea, vomiting, diarrhea, constipation, chest pain, heart palpitations, shortness of breath, blood in stool, black tarry stool, urinary pain, urinary burning, urinary frequency, hematuria.   PHYSICAL EXAMINATION  ECOG PERFORMANCE STATUS: 1 - Symptomatic but completely ambulatory  There were no vitals filed for this visit.  GENERAL:alert, no distress, well nourished, well developed, comfortable, cooperative, smiling and accompanied by mother and father, in chemo-recliner. SKIN: skin color, texture, turgor are normal, no rashes or significant  lesions HEAD: Normocephalic, No masses, lesions, tenderness or abnormalities EYES: normal, PERRLA, EOMI, Conjunctiva are pink and non-injected EARS: External ears normal OROPHARYNX:lips, buccal mucosa, and tongue normal and mucous membranes are moist  NECK: supple, thyroid normal size, non-tender, without nodularity, no stridor, non-tender, trachea midline LYMPH:  no hepatosplenomegaly BREAST:not examined LUNGS: clear to auscultation  HEART: regular rate & rhythm ABDOMEN:abdomen soft and normal bowel sounds BACK: Back symmetric, no curvature. EXTREMITIES:less then 2 second capillary refill, no joint deformities, effusion, or inflammation, no skin discoloration, no cyanosis  NEURO: alert & oriented x 3 with fluent speech, no focal motor/sensory deficits   LABORATORY DATA: CBC    Component Value Date/Time   WBC 2.2* 02/18/2015 0930   RBC 3.94* 02/18/2015 0930   HGB 11.4* 02/18/2015 0930   HCT 34.9* 02/18/2015 0930   PLT 214 02/18/2015 0930   MCV 88.6 02/18/2015 0930   MCH 28.9 02/18/2015 0930  MCHC 32.7 02/18/2015 0930   RDW 13.6 02/18/2015 0930   LYMPHSABS 1.2 02/18/2015 0930   MONOABS 0.4 02/18/2015 0930   EOSABS 0.0 02/18/2015 0930   BASOSABS 0.0 02/18/2015 0930      Chemistry      Component Value Date/Time   NA 136 02/18/2015 0930   K 3.6 02/18/2015 0930   CL 100* 02/18/2015 0930   CO2 29 02/18/2015 0930   BUN 14 02/18/2015 0930   CREATININE 1.05 02/18/2015 0930      Component Value Date/Time   CALCIUM 8.3* 02/18/2015 0930   ALKPHOS 39 02/18/2015 0930   AST 32 02/18/2015 0930   ALT 30 02/18/2015 0930   BILITOT 0.4 02/18/2015 0930        RADIOGRAPHIC STUDIES:  Ct Head Wo Contrast  01/27/2015   CLINICAL DATA:  Blurred vision today.  EXAM: CT HEAD AND ORBITS WITHOUT CONTRAST  TECHNIQUE: Contiguous axial images were obtained from the base of the skull through the vertex without contrast. Multidetector CT imaging of the orbits was performed using the standard  protocol without intravenous contrast.  COMPARISON:  CT head on 09/09/2003 and maxillofacial CT on 04/12/2010  FINDINGS: CT HEAD FINDINGS  The brain demonstrates no evidence of hemorrhage, infarction, edema, mass effect, extra-axial fluid collection, hydrocephalus or mass lesion. The skull is unremarkable.  CT ORBITS FINDINGS  Orbits are normal and symmetric bilaterally. The globes are intact. Optic nerves are symmetric. Extraocular musculature appears normal bilaterally. No inflammatory process or soft tissue mass is identified. The paranasal sinuses and mastoid air cells are normally aerated. Visualized bony structures are within normal limits.  IMPRESSION: Normal head CT and normal orbital CT without contrast.   Electronically Signed   By: Aletta Edouard M.D.   On: 01/27/2015 10:36   Nm Cardiac Muga Rest  01/29/2015   CLINICAL DATA:  Lymphoma cancer. Evaluate cardiac function in relation to chemotherapy.  EXAM: NUCLEAR MEDICINE CARDIAC BLOOD POOL IMAGING (MUGA)  TECHNIQUE: Cardiac multi-gated acquisition was performed at rest following intravenous injection of Tc-82mlabeled red blood cells.  RADIOPHARMACEUTICALS:  Twenty-seven Technetium-955mn-vitro labeled red blood cells IV  COMPARISON:  None  FINDINGS: No focal wall motion abnormality of the left ventricle.  Calculated left ventricular ejection fraction equals 69%  IMPRESSION: Left ventricular ejection fraction equals 69%.   Electronically Signed   By: StSuzy Bouchard.D.   On: 01/29/2015 15:48   Nm Pet Image Initial (pi) Skull Base To Thigh  01/28/2015   CLINICAL DATA:  Initial treatment strategy for Hodgkin's lymphoma.  EXAM: NUCLEAR MEDICINE PET SKULL BASE TO THIGH  TECHNIQUE: 8.0 mCi F-18 FDG was injected intravenously. Full-ring PET imaging was performed from the skull base to thigh after the radiotracer. CT data was obtained and used for attenuation correction and anatomic localization.  FASTING BLOOD GLUCOSE:  Value: 90.0 mg/dl  COMPARISON:   None.  FINDINGS: NECK  No hypermetabolic lymph nodes in the neck.  CHEST  There are hypermetabolic bilateral axillary lymph nodes. Index left axillary node measures 1.2 cm and has an SUV max equal to 5.28. Index right axillary lymph node measures 0.9 cm and has an SUV max equal to the 3.73. Mild increased FDG uptake is associated with mediastinal lymph nodes. The index sub- carinal lymph node measures 1.1 cm and has an SUV max equal to 3.31, image 74 series 4. Left paratracheal lymph node has an SUV max equal to 1.2 cm, image 68/series 4. This has an SUV max equal to 3.14.  No pleural effusion identified. Peripheral area of consolidation within the lingula exhibits increased FDG uptake. This has an SUV max equal to 6.27.  There is a small nodule in the left upper lobe measuring 6 mm. This is too small to characterize by PET-CT.  ABDOMEN/PELVIS  Focal area of increased uptake in the proximal stomach near the GE junction has an SUV max equal to 6.02. No abnormal activity within the liver or spleen. Normal appearance of the pancreas.  Hypermetabolic bilateral inguinal lymph nodes are identified index left inguinal node measures 1 cm and has an SUV max equal to 8.3. Hyper metabolic right inguinal lymph node has an SUV max equal to 4.4. This measures 1 cm.  SKELETON  Numerous lytic lesions are identified throughout the bony pelvis without significant FDG uptake.  IMPRESSION: 1. Mild hyper metabolism is associated with the borderline enlarged bilateral axillary and mediastinal lymph nodes. There is more intense FDG uptake associated with bilateral inguinal lymph nodes. 2. A focal area of peripheral consolidation in the lingula which exhibits increased FDG uptake. Suspicious for pulmonary involvement. 3. Lytic lesions are again noted within the pelvis without associated hyper metabolism.   Electronically Signed   By: Kerby Moors M.D.   On: 01/28/2015 09:00   Ct Biopsy  01/21/2015   CLINICAL DATA:  Recent diagnosis of  Hodgkin's lymphoma based on excision of left inguinal lymph node. The patient requires bone marrow biopsy and also has additional lytic bone lesions of the pelvis including bilateral iliac bones.  EXAM: CT GUIDED LEFT ILIAC BONE MARROW BIOPSY  ANESTHESIA/SEDATION: Versed 3.0 mg IV, Fentanyl 50 mcg IV  Total Moderate Sedation Time  10 minutes.  PROCEDURE: The procedure risks, benefits, and alternatives were explained to the patient. Questions regarding the procedure were encouraged and answered. The patient understands and consents to the procedure.  The left gluteal region was prepped with Betadine. Sterile gown and sterile gloves were used for the procedure. Local anesthesia was provided with 1% Lidocaine.  Under CT guidance, an 11 gauge OnControl bone cutting needle was advanced from a posterior approach into the left iliac bone. Needle positioning was confirmed with CT. Initial non heparinized and heparinized aspirate samples were obtained of bone marrow.  Core biopsy was performed with the OnControl needle.  COMPLICATIONS: None  FINDINGS: The left iliac bone was specifically targeted through the level of a lytic lesion in the medullary cavity. Inspection of initial aspirate did reveal visible particles. Intact core biopsy sample was obtained.  IMPRESSION: CT guided bone marrow biopsy of left posterior iliac bone with both aspirate and core samples obtained.   Electronically Signed   By: Aletta Edouard M.D.   On: 01/21/2015 11:43   Dg Chest Port 1 View  01/31/2015   CLINICAL DATA:  Post Port-A-Cath insertion, history Hodgkin's disease  EXAM: PORTABLE CHEST - 1 VIEW  COMPARISON:  Portable exam 1011 hours compared to 11/04/2014  FINDINGS: LEFT subclavian power port with tip projecting over SVC.  Normal heart size, mediastinal contours, and pulmonary vascularity.  Lungs clear.  No pleural effusion or pneumothorax.  No acute osseous findings.  IMPRESSION: No pneumothorax following LEFT subclavian Port-A-Cath  insertion.   Electronically Signed   By: Lavonia Dana M.D.   On: 01/31/2015 10:21   Dg C-arm 1-60 Min-no Report  01/31/2015   CLINICAL DATA: Port a cath insertion   C-ARM 1-60 MINUTES  Fluoroscopy was utilized by the requesting physician.  No radiographic  interpretation.    Ct Orbitss  W/o Cm  01/27/2015   CLINICAL DATA:  Blurred vision today.  EXAM: CT HEAD AND ORBITS WITHOUT CONTRAST  TECHNIQUE: Contiguous axial images were obtained from the base of the skull through the vertex without contrast. Multidetector CT imaging of the orbits was performed using the standard protocol without intravenous contrast.  COMPARISON:  CT head on 09/09/2003 and maxillofacial CT on 04/12/2010  FINDINGS: CT HEAD FINDINGS  The brain demonstrates no evidence of hemorrhage, infarction, edema, mass effect, extra-axial fluid collection, hydrocephalus or mass lesion. The skull is unremarkable.  CT ORBITS FINDINGS  Orbits are normal and symmetric bilaterally. The globes are intact. Optic nerves are symmetric. Extraocular musculature appears normal bilaterally. No inflammatory process or soft tissue mass is identified. The paranasal sinuses and mastoid air cells are normally aerated. Visualized bony structures are within normal limits.  IMPRESSION: Normal head CT and normal orbital CT without contrast.   Electronically Signed   By: Aletta Edouard M.D.   On: 01/27/2015 10:36      ASSESSMENT AND PLAN:  Hodgkin lymphoma Classical hodgkin's lymphoma, nodular sclerosing type with negative bone marrow aspiration and biopsy on 01/20/2015.  He began ABVD systemic chemotherapy on 02/04/2015.   Presenting today for Day 15 of cycle 1 today.  Pre-treatment labs performed.  I recommended Neosporin (or similar) with a Band-Aid to the superior area of his surgical scar since he is having issues with it healing due to minor trauma from his waistband.  I have reiterated good hand hygiene and wearing sunscreen.   I have answered he and his  family's questions today.  Return in 2 weeks for follow-up and the embark on cycle 2 of chemotherapy.    THERAPY PLAN:  Continue with therapy as planned.  All questions were answered. The patient knows to call the clinic with any problems, questions or concerns. We can certainly see the patient much sooner if necessary.  25 minutes was spent in face-to-face discussion with the patient.  Patient and plan discussed with Dr. Ancil Linsey and she is in agreement with the aforementioned.   This note is electronically signed by: Doy Mince 02/18/2015 11:56 AM

## 2015-02-18 NOTE — Patient Instructions (Signed)
[place] [facility] Discharge Instructions  RECOMMENDATIONS MADE BY THE CONSULTANT AND ANY TEST RESULTS WILL BE SENT TO YOUR REFERRING PHYSICIAN.  Exam completed by Kirby Crigler Return in 2 weeks for follow up  Chemotherapy as planned today Use sunscreen Good hand hygiene. Please call the clinic if you have any questions or concerns   Thank you for choosing [facility] at [facility] to provide your oncology and hematology care.  To afford each patient quality time with our provider, please arrive at least 15 minutes before your scheduled appointment time.    You need to re-schedule your appointment should you arrive 10 or more minutes late.  We strive to give you quality time with our providers, and arriving late affects you and other patients whose appointments are after yours.  Also, if you no show three or more times for appointments you may be dismissed from the clinic at the providers discretion.     Again, thank you for choosing [facility].  Our hope is that these requests will decrease the amount of time that you wait before being seen by our physicians.       _____________________________________________________________  Should you have questions after your visit to [facility], please contact our office at [phone] between the hours of 8:30 a.m. and 4:30 p.m.  Voicemails left after 4:30 p.m. will not be returned until the following business day.  For prescription refill requests, have your pharmacy contact our office.

## 2015-02-18 NOTE — Progress Notes (Signed)
[place] Clinical Social Work  Clinical Social Work was referred by [place] rounding due to previous need for support.  CSW met with pt, his mother and father briefly to check in and reassess needs. Pt reports to have figured out a plan to have limited conversations with his ex-girlfriend and has found this helpful as he grieves the loss of his relationship. Pt denied other needs currently, but agrees to reach out as needed.   Clinical Social Work interventions: Supportive listening   Loren Racer, [place] Tuesdays 8:30-1pm Wednesdays 8:30-12pm  [phone]

## 2015-02-26 ENCOUNTER — Ambulatory Visit (HOSPITAL_COMMUNITY): Payer: Self-pay | Admitting: Oncology

## 2015-03-04 ENCOUNTER — Encounter (HOSPITAL_COMMUNITY): Payer: Self-pay | Admitting: Hematology & Oncology

## 2015-03-04 ENCOUNTER — Encounter (HOSPITAL_COMMUNITY): Payer: Medicaid Other | Attending: Hematology & Oncology

## 2015-03-04 ENCOUNTER — Encounter (HOSPITAL_BASED_OUTPATIENT_CLINIC_OR_DEPARTMENT_OTHER): Payer: Medicaid Other | Admitting: Hematology & Oncology

## 2015-03-04 VITALS — BP 116/61 | HR 78 | Temp 99.1°F | Resp 18 | Wt 170.0 lb

## 2015-03-04 VITALS — BP 109/62 | HR 60 | Temp 98.3°F | Resp 18

## 2015-03-04 DIAGNOSIS — M899 Disorder of bone, unspecified: Secondary | ICD-10-CM

## 2015-03-04 DIAGNOSIS — Z720 Tobacco use: Secondary | ICD-10-CM | POA: Diagnosis not present

## 2015-03-04 DIAGNOSIS — C8190 Hodgkin lymphoma, unspecified, unspecified site: Secondary | ICD-10-CM

## 2015-03-04 DIAGNOSIS — Z79899 Other long term (current) drug therapy: Secondary | ICD-10-CM | POA: Insufficient documentation

## 2015-03-04 DIAGNOSIS — C8115 Nodular sclerosis classical Hodgkin lymphoma, lymph nodes of inguinal region and lower limb: Secondary | ICD-10-CM

## 2015-03-04 DIAGNOSIS — D890 Polyclonal hypergammaglobulinemia: Secondary | ICD-10-CM

## 2015-03-04 DIAGNOSIS — Z5111 Encounter for antineoplastic chemotherapy: Secondary | ICD-10-CM

## 2015-03-04 DIAGNOSIS — F17210 Nicotine dependence, cigarettes, uncomplicated: Secondary | ICD-10-CM | POA: Diagnosis not present

## 2015-03-04 DIAGNOSIS — D709 Neutropenia, unspecified: Secondary | ICD-10-CM

## 2015-03-04 DIAGNOSIS — R109 Unspecified abdominal pain: Secondary | ICD-10-CM | POA: Diagnosis not present

## 2015-03-04 LAB — COMPREHENSIVE METABOLIC PANEL
ALK PHOS: 42 U/L (ref 38–126)
ALT: 31 U/L (ref 17–63)
AST: 30 U/L (ref 15–41)
Albumin: 3.4 g/dL — ABNORMAL LOW (ref 3.5–5.0)
Anion gap: 7 (ref 5–15)
BUN: 16 mg/dL (ref 6–20)
CALCIUM: 8.4 mg/dL — AB (ref 8.9–10.3)
CO2: 30 mmol/L (ref 22–32)
Chloride: 99 mmol/L — ABNORMAL LOW (ref 101–111)
Creatinine, Ser: 1.1 mg/dL (ref 0.61–1.24)
GFR calc Af Amer: >60 mL/min (ref >60)
GFR calc non Af Amer: >60 mL/min (ref >60)
Glucose, Bld: 128 mg/dL — ABNORMAL HIGH (ref 65–99)
POTASSIUM: 3.7 mmol/L (ref 3.5–5.1)
Sodium: 136 mmol/L (ref 135–145)
TOTAL PROTEIN: 7.9 g/dL (ref 6.5–8.1)
Total Bilirubin: 0.6 mg/dL (ref 0.3–1.2)

## 2015-03-04 LAB — CBC WITH DIFFERENTIAL/PLATELET
Basophils Absolute: 0 10*3/uL (ref 0.0–0.1)
Basophils Relative: 1 % (ref 0–1)
Eosinophils Absolute: 0 10*3/uL (ref 0.0–0.7)
Eosinophils Relative: 1 % (ref 0–5)
HEMATOCRIT: 34.7 % — AB (ref 39.0–52.0)
HEMOGLOBIN: 11.2 g/dL — AB (ref 13.0–17.0)
LYMPHS PCT: 55 % — AB (ref 12–46)
Lymphs Abs: 1.1 10*3/uL (ref 0.7–4.0)
MCH: 28.4 pg (ref 26.0–34.0)
MCHC: 32.3 g/dL (ref 30.0–36.0)
MCV: 87.8 fL (ref 78.0–100.0)
Monocytes Absolute: 0.5 10*3/uL (ref 0.1–1.0)
Monocytes Relative: 23 % — ABNORMAL HIGH (ref 3–12)
NEUTROS PCT: 20 % — AB (ref 43–77)
Neutro Abs: 0.4 10*3/uL — ABNORMAL LOW (ref 1.7–7.7)
Platelets: 189 10*3/uL (ref 150–400)
RBC: 3.95 MIL/uL — AB (ref 4.22–5.81)
RDW: 14 % (ref 11.5–15.5)
WBC: 2 10*3/uL — AB (ref 4.0–10.5)

## 2015-03-04 MED ORDER — SODIUM CHLORIDE 0.9 % IV SOLN
Freq: Once | INTRAVENOUS | Status: AC
  Administered 2015-03-04: 12:00:00 via INTRAVENOUS

## 2015-03-04 MED ORDER — DOXORUBICIN HCL CHEMO IV INJECTION 2 MG/ML
25.0000 mg/m2 | Freq: Once | INTRAVENOUS | Status: AC
  Administered 2015-03-04: 48 mg via INTRAVENOUS
  Filled 2015-03-04: qty 24

## 2015-03-04 MED ORDER — BLEOMYCIN SULFATE CHEMO INJECTION 30 UNIT
10.0000 [IU]/m2 | Freq: Once | INTRAMUSCULAR | Status: AC
  Administered 2015-03-04: 19 [IU] via INTRAVENOUS
  Filled 2015-03-04: qty 6.3

## 2015-03-04 MED ORDER — CITALOPRAM HYDROBROMIDE 20 MG PO TABS: ORAL_TABLET | ORAL | Status: DC

## 2015-03-04 MED ORDER — HEPARIN SOD (PORK) LOCK FLUSH 100 UNIT/ML IV SOLN
500.0000 [IU] | Freq: Once | INTRAVENOUS | Status: AC | PRN
  Administered 2015-03-04: 500 [IU]
  Filled 2015-03-04: qty 5

## 2015-03-04 MED ORDER — SODIUM CHLORIDE 0.9 % IJ SOLN
10.0000 mL | INTRAMUSCULAR | Status: DC | PRN
  Administered 2015-03-04: 10 mL
  Filled 2015-03-04: qty 10

## 2015-03-04 MED ORDER — OMEPRAZOLE 40 MG PO CPDR: 40.0000 mg | DELAYED_RELEASE_CAPSULE | Freq: Every day | ORAL | Status: DC

## 2015-03-04 MED ORDER — SODIUM CHLORIDE 0.9 % IV SOLN
Freq: Once | INTRAVENOUS | Status: AC
  Administered 2015-03-04: 12:00:00 via INTRAVENOUS
  Filled 2015-03-04: qty 5

## 2015-03-04 MED ORDER — SODIUM CHLORIDE 0.9 % IV SOLN
375.0000 mg/m2 | Freq: Once | INTRAVENOUS | Status: AC
  Administered 2015-03-04: 720 mg via INTRAVENOUS
  Filled 2015-03-04: qty 36

## 2015-03-04 MED ORDER — DOXYCYCLINE HYCLATE 100 MG PO TABS: 100.0000 mg | ORAL_TABLET | Freq: Two times a day (BID) | ORAL | Status: DC

## 2015-03-04 MED ORDER — PALONOSETRON HCL INJECTION 0.25 MG/5ML
0.2500 mg | Freq: Once | INTRAVENOUS | Status: AC
  Administered 2015-03-04: 0.25 mg via INTRAVENOUS
  Filled 2015-03-04: qty 5

## 2015-03-04 MED ORDER — SODIUM CHLORIDE 0.9 % IV SOLN
6.0000 mg/m2 | Freq: Once | INTRAVENOUS | Status: AC
  Administered 2015-03-04: 11.5 mg via INTRAVENOUS
  Filled 2015-03-04: qty 11.5

## 2015-03-04 NOTE — Patient Instructions (Signed)
See AVS from MD exam

## 2015-03-04 NOTE — Progress Notes (Signed)
[facility] said that

## 2015-03-04 NOTE — Patient Instructions (Addendum)
[place] at [facility] Discharge Instructions  RECOMMENDATIONS MADE BY THE CONSULTANT AND ANY TEST RESULTS WILL BE SENT TO YOUR REFERRING PHYSICIAN.  Treatment today as scheduled Your white blood cell count is low and we will give you a shot that will help to increase your white blood cells after chemo Return tomorrow for Neulasta injection   You will have another heart test on July 29th.  You will also have a pet scan near the end of July  Thank you for choosing [place] at [facility] to provide your oncology and hematology care.  To afford each patient quality time with our provider, please arrive at least 15 minutes before your scheduled appointment time.    You need to re-schedule your appointment should you arrive 10 or more minutes late.  We strive to give you quality time with our providers, and arriving late affects you and other patients whose appointments are after yours.  Also, if you no show three or more times for appointments you may be dismissed from the clinic at the providers discretion.     Again, thank you for choosing [facility].  Our hope is that these requests will decrease the amount of time that you wait before being seen by our physicians.       _____________________________________________________________  Should you have questions after your visit to [facility], please contact our office at [phone] between the hours of 8:30 a.m. and 4:30 p.m.  Voicemails left after 4:30 p.m. will not be returned until the following business day.  For prescription refill requests, have your pharmacy contact our office.    Pegfilgrastim injection What is this medicine? PEGFILGRASTIM (peg fil GRA stim) is a long-acting granulocyte colony-stimulating factor that stimulates the growth of neutrophils, a type of white blood cell important in the body's fight against infection. It is used to reduce the  incidence of fever and infection in patients with certain types of cancer who are receiving chemotherapy that affects the bone marrow. This medicine may be used for other purposes; ask your health care provider or pharmacist if you have questions. COMMON BRAND NAME(S): Neulasta What should I tell my health care provider before I take this medicine? They need to know if you have any of these conditions: -latex allergy -ongoing radiation therapy -sickle cell disease -skin reactions to acrylic adhesives (On-Body Injector only) -an unusual or allergic reaction to pegfilgrastim, filgrastim, other medicines, foods, dyes, or preservatives -pregnant or trying to get pregnant -breast-feeding How should I use this medicine? This medicine is for injection under the skin. If you get this medicine at home, you will be taught how to prepare and give the pre-filled syringe or how to use the On-body Injector. Refer to the patient Instructions for Use for detailed instructions. Use exactly as directed. Take your medicine at regular intervals. Do not take your medicine more often than directed. It is important that you put your used needles and syringes in a special sharps container. Do not put them in a trash can. If you do not have a sharps container, call your pharmacist or healthcare provider to get one. Talk to your pediatrician regarding the use of this medicine in children. Special care may be needed. Overdosage: If you think you have taken too much of this medicine contact a poison control center or emergency room at once. NOTE: This medicine is only for you. Do not share this medicine  with others. What if I miss a dose? It is important not to miss your dose. Call your doctor or health care professional if you miss your dose. If you miss a dose due to an On-body Injector failure or leakage, a new dose should be administered as soon as possible using a single prefilled syringe for manual use. What may  interact with this medicine? Interactions have not been studied. Give your health care provider a list of all the medicines, herbs, non-prescription drugs, or dietary supplements you use. Also tell them if you smoke, drink alcohol, or use illegal drugs. Some items may interact with your medicine. This list may not describe all possible interactions. Give your health care provider a list of all the medicines, herbs, non-prescription drugs, or dietary supplements you use. Also tell them if you smoke, drink alcohol, or use illegal drugs. Some items may interact with your medicine. What should I watch for while using this medicine? You may need blood work done while you are taking this medicine. If you are going to need a MRI, CT scan, or other procedure, tell your doctor that you are using this medicine (On-Body Injector only). What side effects may I notice from receiving this medicine? Side effects that you should report to your doctor or health care professional as soon as possible: -allergic reactions like skin rash, itching or hives, swelling of the face, lips, or tongue -dizziness -fever -pain, redness, or irritation at site where injected -pinpoint red spots on the skin -shortness of breath or breathing problems -stomach or side pain, or pain at the shoulder -swelling -tiredness -trouble passing urine Side effects that usually do not require medical attention (report to your doctor or health care professional if they continue or are bothersome): -bone pain -muscle pain This list may not describe all possible side effects. Call your doctor for medical advice about side effects. You may report side effects to FDA at 1-800-FDA-1088. Where should I keep my medicine? Keep out of the reach of children. Store pre-filled syringes in a refrigerator between 2 and 8 degrees C (36 and 46 degrees F). Do not freeze. Keep in carton to protect from light. Throw away this medicine if it is left out of  the refrigerator for more than 48 hours. Throw away any unused medicine after the expiration date. NOTE: This sheet is a summary. It may not cover all possible information. If you have questions about this medicine, talk to your doctor, pharmacist, or health care provider.  2015, Elsevier/Gold Standard. (2013-11-15 16:14:05)

## 2015-03-04 NOTE — Progress Notes (Signed)
Labs discussed with Dr. Whitney Muse and decision made to proceed with treatment and add neulasta this cycle Tolerated treatment. Discussed neulasta side-effects and neutropenic precautions

## 2015-03-04 NOTE — Progress Notes (Signed)
[place] at [place] NOTE  Patient Care Team: No Pcp Per Patient as PCP - General (General Practice)  CHIEF COMPLAINTS/PURPOSE OF CONSULTATION:  Hodgkin lymphoma   Staging form: Lymphoid Neoplasms, AJCC 6th Edition     Clinical stage from 03/04/2015: Stage III - Unsigned       Staging comments:          Questionable involvement of lingula on PET imaging. BMBX negative. PET with mild hyper metabolism with borderline enlarged bilateral axillar and mediastinal nodes. Bilateral inguinal nodes.   NOTE: patient has lytic lesion in the pelvis that are NO         T PET avid. Not biopsied    L inquinal LN biopsy on 01/13/2015 with final pathology showing Classical Hodgkin Lymphoma, nodular sclerosing type Bone marrow biopsy 01/20/2015 without evidence of malignancy    Hodgkin lymphoma   01/13/2015 Initial Biopsy L inguinal LN biopsy on 01/13/2015 with Classical Hodgkin Lymphoma, nodular sclerosing type   01/20/2015 Bone Marrow Biopsy normal BMBX, mild polyclonal plasmacytosis   01/28/2015 Imaging PET/CT Mild hyper metabolism is associated with the borderline enlarged bilateral axillary and mediastinal lymph nodes. There is more intense FDG uptake associated with bilateral inguinal lymph nodes. focal area of peripheral consolidation in the lingula.   01/29/2015 Imaging MUGA with EF 69%   01/29/2015 Imaging 1.spirometry is normal .no change with bronchodilator. lung volumes are normal. airway resistance somewhat high. DLCO normal decreased MIP/MEP may indicate weakness of the bellows function of the lung.    02/04/2015 -  Chemotherapy ABVD     HISTORY OF PRESENTING ILLNESS:  Robert Acevedo 44 y.o. male is here for additional follow-up of newly diagnosed Hodgkin Lymphoma.   He is present alone today and likes to be called [name] He denies nausea, vomiting, diarrhea or constipation.  He eats about 4 times a day. He mentions that his dad is gone all the time now. He  thinks it is how he copes with his cancer.  He sometimes experiences a sharp pain in the right side of his back. He takes half a pain pill to alleviate it. He says that taking a whole pill causes him to sweat.  He continues to have abdominal pain, centralized. He did not pick up his PPI. He describes the pain as intermittent and somewhat "burning."  MEDICAL HISTORY:  Past Medical History  Diagnosis Date  . Bronchitis   . Lymphadenopathy, inguinal     left  . Hodgkin's lymphoma     SURGICAL HISTORY: Past Surgical History  Procedure Laterality Date  . Wrist surgery      right  . Knee surgery    . Incision and drainage peritonsillar abscess    . Lymph node biopsy Left 01/13/2015    Procedure: LEFT INGUINAL LYMPH NODE BIOPSY;  Surgeon: Aviva Signs Md, MD;  Location: [facility];  Service: General;  Laterality: Left;  . Bone marrow biopsy Left 01/20/15  . Bone marrow aspiration Left 01/20/15  . Portacath placement Left 01/31/2015    Procedure: INSERTION PORT-A-CATH;  Surgeon: Aviva Signs Md, MD;  Location: [facility];  Service: General;  Laterality: Left;    SOCIAL HISTORY: History   Social History  . Marital Status: Single    Spouse Name: N/A  . Number of Children: N/A  . Years of Education: N/A   Occupational History  . Not on file.   Social History Main Topics  . Smoking status: Current Every Day Smoker --  0.50 packs/day for 2 years    Types: Cigarettes  . Smokeless tobacco: Never Used  . Alcohol Use: Yes     Comment: occasional  . Drug Use: Yes    Special: Marijuana     Comment: on occasion - last used 01/26/15  . Sexual Activity: Yes    Birth Control/ Protection: None   Other Topics Concern  . Not on file   Social History Narrative  Pt is a smoker starting 2008, pack of cigarettes a week. Infrequent drinking alcohol, maybe one or two once a month. Denies drug use other than daily marijuana. In jail for 18 months.  Has 3 kids, 24 yo, 20 yo, and 5 yo. Has 1 grandchild. He  has a car.    FAMILY HISTORY: Family History  Problem Relation Age of Onset  . Seizures Mother   . Diabetes Father   Father is 38 yo diabetic. Mother is 29 yo. Both still living. 1 brother, 4 sisters. One sister has breast cancer, diagnosed 1996 in her 27s. Another sister has severe depression. He has been with his girlfriend 9-10 years. He lives with his girlfriend and his father. He hasn't told his father about his diagnosis yet. One sister lives in [place] and she is aware of his diagnosis. He told two of his childhood friends about his diagnosis.   ALLERGIES:  is allergic to clindamycin/lincomycin.  MEDICATIONS:  Current Outpatient Prescriptions  Medication Sig Dispense Refill  . bleomycin (BLEOCIN) 15 UNITS injection Inject into the vein once. To be administered Day 1 & Day 15 every 28 days. To begin 02/04/15.    . citalopram (CELEXA) 20 MG tablet Take 1 tablet daily x 7 days. Then increase to 2 tablets daily. 49 tablet 0  . dacarbazine (DTIC) 200 MG chemo injection Inject into the vein once. To be administered Day 1 & Day 15 every 28 days. To begin 02/04/15.    Marland Kitchen DOXOrubicin HCl (ADRIAMYCIN IV) Inject into the vein. To be administered Day 1 & Day 15 every 28 days. To begin 02/04/15.    Marland Kitchen HYDROcodone-acetaminophen (NORCO/VICODIN) 5-325 MG per tablet Take 1-2 tablets by mouth every 4 (four) hours as needed for severe pain. 40 tablet 0  . ketorolac (ACULAR) 0.5 % ophthalmic solution Place 1 drop into both eyes every 6 (six) hours. 3 mL 0  . lidocaine-prilocaine (EMLA) cream Apply a quarter size amount to port site 1 hour prior to chemo. Do not rub in. Cover with plastic wrap. 30 g 3  . LORazepam (ATIVAN) 0.5 MG tablet Take 1 tablet (0.5 mg total) by mouth every 8 (eight) hours as needed for anxiety. 30 tablet 1  . omeprazole (PRILOSEC) 40 MG capsule Take 1 capsule (40 mg total) by mouth daily. 30 capsule 3  . ondansetron (ZOFRAN) 8 MG tablet Take 1 tablet (8 mg total) by mouth every 8  (eight) hours as needed for nausea or vomiting. 60 tablet 1  . prochlorperazine (COMPAZINE) 10 MG tablet Take 1 tablet (10 mg total) by mouth every 6 (six) hours as needed for nausea or vomiting. 60 tablet 1  . vinBLAStine (VELBAN) 10 MG injection Inject into the vein. To be administered Day 1 & Day 15 every 28 days. To begin 02/04/15.    Marland Kitchen doxycycline (VIBRA-TABS) 100 MG tablet Take 1 tablet (100 mg total) by mouth 2 (two) times daily. 20 tablet 0   No current facility-administered medications for this visit.   Facility-Administered Medications Ordered in Other Visits  Medication  Dose Route Frequency Provider Last Rate Last Dose  . 0.9 %  sodium chloride infusion   Intravenous Once Patrici Ranks, MD      . bleomycin (BLEOCIN) 19 Units in sodium chloride 0.9 % 50 mL chemo infusion  10 Units/m2 (Treatment Plan Actual) Intravenous Once Patrici Ranks, MD      . dacarbazine (DTIC) 720 mg in sodium chloride 0.9 % 250 mL chemo infusion  375 mg/m2 (Treatment Plan Actual) Intravenous Once Patrici Ranks, MD      . DOXOrubicin (ADRIAMYCIN) chemo injection 48 mg  25 mg/m2 (Treatment Plan Actual) Intravenous Once Patrici Ranks, MD      . fosaprepitant (EMEND) 150 mg, dexamethasone (DECADRON) 12 mg in sodium chloride 0.9 % 145 mL IVPB   Intravenous Once Patrici Ranks, MD      . heparin lock flush 100 unit/mL  500 Units Intracatheter Once PRN Patrici Ranks, MD      . palonosetron (ALOXI) injection 0.25 mg  0.25 mg Intravenous Once Patrici Ranks, MD      . sodium chloride 0.9 % injection 10 mL  10 mL Intracatheter PRN Patrici Ranks, MD      . vinBLAStine (VELBAN) 11.5 mg in sodium chloride 0.9 % 50 mL chemo infusion  6 mg/m2 (Treatment Plan Actual) Intravenous Once Patrici Ranks, MD        Review of Systems  Constitutional: Negative for weight loss and diaphoresis. Negative for fever, chills and malaise/fatigue.  HENT: Negative for congestion, ear discharge, ear pain, hearing  loss, nosebleeds, sore throat and tinnitus.   Eyes: Negative for blurred vision. Negative for double vision, photophobia, pain, discharge and redness.  Respiratory: Negative.  Negative for stridor.   Cardiovascular: Negative.   Gastrointestinal: Negative for heartburn, nausea, vomiting, diarrhea, blood in stool and melena. Positive for abdominal discomfort Genitourinary: Negative for dysuria, frequency, hematuria and flank pain.  Musculoskeletal: Negative.   Skin: Negative.   Neurological: Negative for dizziness, tingling, tremors, sensory change, speech change, focal weakness, seizures, loss of consciousness and weakness.  Endo/Heme/Allergies: Negative.   Psychiatric/Behavioral: Positive for substance abuse. Negative for depression, suicidal ideas, hallucinations and memory loss. The patient has insomnia. The patient is not nervous/anxious.     14 point ROS was done and is otherwise as detailed above or in HPI   PHYSICAL EXAMINATION: ECOG PERFORMANCE STATUS: 1 - Symptomatic but completely ambulatory  Filed Vitals:   03/04/15 1000  BP: 116/61  Pulse: 78  Temp: 99.1 F (37.3 C)  Resp: 18   Filed Weights   03/04/15 1000  Weight: 170 lb (77.111 kg)   Physical Exam  Constitutional: He is oriented to person, place, and time and well-developed, well-nourished, and in no distress.  HENT:  Head: Normocephalic and atraumatic.  Nose: Nose normal.  Mouth/Throat: Oropharynx is clear and moist. No oropharyngeal exudate.  Eyes: Conjunctivae and EOM are normal. Pupils are equal, round, and reactive to light. Right eye exhibits no discharge. Left eye exhibits no discharge. No scleral icterus.  Neck: Normal range of motion. Neck supple. No tracheal deviation present. No thyromegaly present.  Cardiovascular: Normal rate, regular rhythm and normal heart sounds.  Exam reveals no gallop and no friction rub.   No murmur heard. Pulmonary/Chest: Effort normal and breath sounds normal. He has no  wheezes. He has no rales.  Abdominal: Soft. Bowel sounds are normal. He exhibits no distension and no mass. There is no tenderness. There is no rebound and no  guarding.  Genitourinary:  deferred Musculoskeletal: Normal range of motion. He exhibits no edema.   Neurological: He is alert and oriented to person, place, and time. He has normal reflexes. No cranial nerve deficit. Gait normal. Coordination normal.  Skin: Skin is warm and dry. No rash noted.  Psychiatric: Mood, memory, affect and judgment normal.  Nursing note and vitals reviewed.   LABORATORY DATA:  I have reviewed the data as listed Lab Results  Component Value Date   WBC 2.0* 03/04/2015   HGB 11.2* 03/04/2015   HCT 34.7* 03/04/2015   MCV 87.8 03/04/2015   PLT 189 03/04/2015   PATHOLOGY:  BMBXIAGNOSIS Diagnosis Bone Marrow, Aspirate,Biopsy, and Clot, left iliac - NORMOCELLULAR BONE MARROW FOR AGE WITH TRILINEAGE HEMATOPOIESIS. - MILD POLYCLONAL PLASMACYTOSIS - NO TUMOR IDENTIFIED. PERIPHERAL BLOOD: - NORMOCYTIC-NORMOCHROMIC ANEMIA. Diagnosis Note There is no morphologic evidence of involvement by Hodgkin lymphoma in this material. Clinical correlation is recommended. (BNS:ecj 01/22/2015) Susanne Greenhouse MD Pathologist, Electronic Signature  LYMPH NODE BIOPSY Diagnosis Lymph node for lymphoma, left inguinal - CLASSICAL HODGKIN LYMPHOMA, NODULAR SCLEROSIS TYPE. - SEE ONCOLOGY TABLE. Microscopic Comment LYMPHOMA Histologic type: Classical Hodgkin lymphoma, nodular sclerosis type. Grade (if applicable): N/A. Flow cytometry: Not performed. Immunohistochemical stains: CD45, CD20, CD79a, CD3, CD30, and CD15. Touch preps/imprints: Mixed lymphoid population with scattered large cells with bi-/multi-nucleation and prominent nucleoli. Comments: Sections of lymph nodes reveal effacement of the architecture by a nodular lymphoreticular proliferation. There are abundant large cells with bi-/multi-nucleation and prominent  nucleoli, consistent with Hodgkin Reed-Sternberg (HRS) cells. These occur both scattered and in large loose clusters. There are lacunar variants and mummified cells. Focally, there are broad bands of fibrosis creating a more nodular appearance. In these areas the background reveals small lymphocytes and plasma cells with scattered eosinophils and neutrophils, while less fibrotic areas have a more lymphocytic background. The smaller node is only partially involved with areas of residual reactive lymph node. Immunohistochemistry reveals the HRS cells are positive for CD30 and CD15. They are negative for CD20, CD79a, and CD45. CD20 and CD79 highlight residual B-cell areas. CD3 highlights abundant T-cells. The overall findings are consistent with classical Hodgkin lymphoma, nodular sclerosis type. The case was called to Drs. Jenkins and [place] on 01/15/2015. Vicente Males MD Pathologist, Electronic Signature (Case signed 01/15/2015) Specimen Gross and Clinical Information   RADIOGRAPHIC STUDIES:   CLINICAL DATA: Left groin mass for 18 months.  EXAM: CT PELVIS WITH CONTRAST  TECHNIQUE: Multidetector CT imaging of the pelvis was performed using the standard protocol following the bolus administration of intravenous contrast.  CONTRAST: 177m OMNIPAQUE IOHEXOL 300 MG/ML SOLN  COMPARISON: Ultrasound dated 12/18/2014  FINDINGS: There are multiple enlarged left inguinal lymph nodes. The largest node measures 41 x 22 mm on the coronal image. A slightly more inferior and medial node measures 23 x 16 mm and an external iliac node that measures 17 x 11 mm on the sagittal images.  There are multiple small nodes in the right inguinal region. No other discrete adenopathy is visible in the pelvis.  The patient has numerous lytic lesions in the iliac bones bilaterally some involving the cortex. The largest lesion is in the left ilium measuring 22 mm on the coronal image. There are 2  lytic lesions in the sacrum. Proximal femurs appear normal.  IMPRESSION: 1. Adenopathy in the left groin and involving the left external iliac chain. The adenopathy is nonspecific. 2. Multiple lytic lesions in the iliac bones and sacrum. This appearance is most consistent with  multiple myeloma.   Electronically Signed  By: Lorriane Shire M.D.  On: 12/20/2014 22:50   I have personally reviewed the radiological images as listed and agreed with the findings in the report.  ASSESSMENT & PLAN:  Hodgkin Lymphoma, Nodular sclerosing type, Stage III-2A (? involvement in lingula) Abnormal CT of the pelvis with lytic bone disease, not PET avid Polyclonal gammopathy Elevated ESR IPS Score: 5 year freedom from progression and OS of 2; 80% FFP and 91% OS Eye EXAM on 01/29/2015 with possible lymphoma lesion in retina OD (Dr. Iona Hansen)   He is neutropenic today however given his diagnosis we will proceed forward with therapy. Neutropenic teaching will be performed. I have recommended proceeding with Neulasta after this cycle because of his prolonged neutropenia.  Plan is for continuation of ABVD in accordance with NCCN guidelines. We will repeat imaging with PET after 2 cycles.  Additional treatment will be decided in accordance with the PET 5-point scale (deauville criteria)   He has done remarkably well. I have encouraged him to take his omeprazole however as prescribed. I do feel some of his abdominal symptoms will be alleviated with the medication.  He still complains of a small dime-sized "abscess" on the testicular area. We examined this when I first met him and it is superficial. I did call him in another antibiotic today but advised him if it persists we need to get him over to Dr. Arnoldo Morale.  He will return in 2 weeks for his next cycle. Pet imaging will be performed after day 15. Again neutropenic precautions and teaching will be done today.  All questions were answered. The patient  knows to call the clinic with any problems, questions or concerns.   This note was electronically signed.    This document serves as a record of services personally performed by Ancil Linsey, MD. It was created on her behalf by Arlyce Harman, a trained medical scribe. The creation of this record is based on the scribe's personal observations and the provider's statements to them. This document has been checked and approved by the attending provider.  I have reviewed the above documentation for accuracy and completeness, and I agree with the above.  Kelby Fam. Whitney Muse, MD

## 2015-03-05 ENCOUNTER — Encounter (HOSPITAL_BASED_OUTPATIENT_CLINIC_OR_DEPARTMENT_OTHER): Payer: Medicaid Other

## 2015-03-05 VITALS — BP 100/56 | HR 74 | Temp 98.0°F | Resp 16

## 2015-03-05 DIAGNOSIS — D709 Neutropenia, unspecified: Secondary | ICD-10-CM | POA: Diagnosis not present

## 2015-03-05 DIAGNOSIS — Z5189 Encounter for other specified aftercare: Secondary | ICD-10-CM

## 2015-03-05 DIAGNOSIS — C8190 Hodgkin lymphoma, unspecified, unspecified site: Secondary | ICD-10-CM

## 2015-03-05 MED ORDER — PEGFILGRASTIM INJECTION 6 MG/0.6ML ~~LOC~~
6.0000 mg | PREFILLED_SYRINGE | Freq: Once | SUBCUTANEOUS | Status: AC
  Administered 2015-03-05: 6 mg via SUBCUTANEOUS

## 2015-03-05 MED ORDER — PEGFILGRASTIM INJECTION 6 MG/0.6ML ~~LOC~~
PREFILLED_SYRINGE | SUBCUTANEOUS | Status: AC
  Filled 2015-03-05: qty 0.6

## 2015-03-05 NOTE — Progress Notes (Signed)
Robert Acevedo presents today for injection per MD orders. Neulasta 6mg  administered SQ in left Abdomen. Administration without incident. Patient tolerated well.

## 2015-03-18 ENCOUNTER — Encounter (HOSPITAL_BASED_OUTPATIENT_CLINIC_OR_DEPARTMENT_OTHER): Payer: Medicaid Other

## 2015-03-18 ENCOUNTER — Encounter (HOSPITAL_BASED_OUTPATIENT_CLINIC_OR_DEPARTMENT_OTHER): Payer: Medicaid Other | Admitting: Oncology

## 2015-03-18 ENCOUNTER — Encounter: Payer: Self-pay | Admitting: *Deleted

## 2015-03-18 ENCOUNTER — Encounter (HOSPITAL_COMMUNITY): Payer: Self-pay | Admitting: Oncology

## 2015-03-18 VITALS — BP 119/73 | HR 82 | Temp 98.1°F | Resp 16

## 2015-03-18 VITALS — BP 117/81 | HR 88 | Temp 98.3°F | Resp 16 | Wt 161.5 lb

## 2015-03-18 DIAGNOSIS — C8190 Hodgkin lymphoma, unspecified, unspecified site: Secondary | ICD-10-CM

## 2015-03-18 DIAGNOSIS — M898X9 Other specified disorders of bone, unspecified site: Secondary | ICD-10-CM

## 2015-03-18 DIAGNOSIS — C8115 Nodular sclerosis classical Hodgkin lymphoma, lymph nodes of inguinal region and lower limb: Secondary | ICD-10-CM

## 2015-03-18 DIAGNOSIS — R1013 Epigastric pain: Secondary | ICD-10-CM

## 2015-03-18 DIAGNOSIS — Z5111 Encounter for antineoplastic chemotherapy: Secondary | ICD-10-CM | POA: Diagnosis not present

## 2015-03-18 LAB — COMPREHENSIVE METABOLIC PANEL
ALK PHOS: 57 U/L (ref 38–126)
ALT: 18 U/L (ref 17–63)
ANION GAP: 8 (ref 5–15)
AST: 22 U/L (ref 15–41)
Albumin: 3.7 g/dL (ref 3.5–5.0)
BUN: 12 mg/dL (ref 6–20)
CO2: 29 mmol/L (ref 22–32)
Calcium: 9.1 mg/dL (ref 8.9–10.3)
Chloride: 101 mmol/L (ref 101–111)
Creatinine, Ser: 1.15 mg/dL (ref 0.61–1.24)
GFR calc Af Amer: >60 mL/min (ref >60)
GFR calc non Af Amer: >60 mL/min (ref >60)
Glucose, Bld: 90 mg/dL (ref 65–99)
POTASSIUM: 3.7 mmol/L (ref 3.5–5.1)
Sodium: 138 mmol/L (ref 135–145)
Total Bilirubin: 0.4 mg/dL (ref 0.3–1.2)
Total Protein: 8.5 g/dL — ABNORMAL HIGH (ref 6.5–8.1)

## 2015-03-18 LAB — CBC WITH DIFFERENTIAL/PLATELET
Basophils Absolute: 0 10*3/uL (ref 0.0–0.1)
Basophils Relative: 0 % (ref 0–1)
Eosinophils Absolute: 0.1 10*3/uL (ref 0.0–0.7)
Eosinophils Relative: 1 % (ref 0–5)
HEMATOCRIT: 38.2 % — AB (ref 39.0–52.0)
Hemoglobin: 12.4 g/dL — ABNORMAL LOW (ref 13.0–17.0)
LYMPHS ABS: 1.7 10*3/uL (ref 0.7–4.0)
LYMPHS PCT: 21 % (ref 12–46)
MCH: 28 pg (ref 26.0–34.0)
MCHC: 32.5 g/dL (ref 30.0–36.0)
MCV: 86.2 fL (ref 78.0–100.0)
MONO ABS: 0.8 10*3/uL (ref 0.1–1.0)
Monocytes Relative: 11 % (ref 3–12)
Neutro Abs: 5.2 10*3/uL (ref 1.7–7.7)
Neutrophils Relative %: 67 % (ref 43–77)
PLATELETS: 199 10*3/uL (ref 150–400)
RBC: 4.43 MIL/uL (ref 4.22–5.81)
RDW: 14.6 % (ref 11.5–15.5)
WBC: 7.8 10*3/uL (ref 4.0–10.5)

## 2015-03-18 MED ORDER — SODIUM CHLORIDE 0.9 % IV SOLN
375.0000 mg/m2 | Freq: Once | INTRAVENOUS | Status: AC
  Administered 2015-03-18: 720 mg via INTRAVENOUS
  Filled 2015-03-18: qty 36

## 2015-03-18 MED ORDER — SODIUM CHLORIDE 0.9 % IV SOLN
Freq: Once | INTRAVENOUS | Status: AC
  Administered 2015-03-18: 12:00:00 via INTRAVENOUS
  Filled 2015-03-18: qty 5

## 2015-03-18 MED ORDER — HEPARIN SOD (PORK) LOCK FLUSH 100 UNIT/ML IV SOLN
INTRAVENOUS | Status: AC
  Filled 2015-03-18: qty 5

## 2015-03-18 MED ORDER — VINBLASTINE SULFATE CHEMO INJECTION 1 MG/ML
6.0000 mg/m2 | Freq: Once | INTRAVENOUS | Status: AC
  Administered 2015-03-18: 11.5 mg via INTRAVENOUS
  Filled 2015-03-18: qty 11.5

## 2015-03-18 MED ORDER — SODIUM CHLORIDE 0.9 % IJ SOLN
10.0000 mL | INTRAMUSCULAR | Status: DC | PRN
  Administered 2015-03-18: 10 mL
  Filled 2015-03-18: qty 10

## 2015-03-18 MED ORDER — DOXORUBICIN HCL CHEMO IV INJECTION 2 MG/ML
25.0000 mg/m2 | Freq: Once | INTRAVENOUS | Status: AC
  Administered 2015-03-18: 48 mg via INTRAVENOUS
  Filled 2015-03-18: qty 24

## 2015-03-18 MED ORDER — HEPARIN SOD (PORK) LOCK FLUSH 100 UNIT/ML IV SOLN
500.0000 [IU] | Freq: Once | INTRAVENOUS | Status: AC | PRN
  Administered 2015-03-18: 500 [IU]

## 2015-03-18 MED ORDER — OXYCODONE-ACETAMINOPHEN 5-325 MG PO TABS: 1.0000 | ORAL_TABLET | Freq: Four times a day (QID) | ORAL | Status: DC | PRN

## 2015-03-18 MED ORDER — BLEOMYCIN SULFATE CHEMO INJECTION 30 UNIT
10.0000 [IU]/m2 | Freq: Once | INTRAMUSCULAR | Status: AC
  Administered 2015-03-18: 19 [IU] via INTRAVENOUS
  Filled 2015-03-18: qty 6.33

## 2015-03-18 MED ORDER — SODIUM CHLORIDE 0.9 % IV SOLN
Freq: Once | INTRAVENOUS | Status: AC
  Administered 2015-03-18: 11:00:00 via INTRAVENOUS

## 2015-03-18 MED ORDER — PALONOSETRON HCL INJECTION 0.25 MG/5ML
0.2500 mg | Freq: Once | INTRAVENOUS | Status: AC
  Administered 2015-03-18: 0.25 mg via INTRAVENOUS
  Filled 2015-03-18: qty 5

## 2015-03-18 NOTE — Assessment & Plan Note (Addendum)
Stage III Hodgkin's Lymphoma with questionable involvement of lingula on PET imaging. BMBX negative. PET with mild hyper metabolism with borderline enlarged bilateral axillar and mediastinal nodes. Bilateral inguinal nodes.   He began ABVD treatment on 02/04/2015.  Upcoming PET scan scheduled on 7/29 to evaluate response to therapy following 2 cycles of treatment.  Pre-chemo labs as ordered  Rx for Percocet for Neulasta-induced bone pain.  He can also try Aleve up to 4 times daily for bone pain from Neulasta.  I have recommended gastric ulcer dosing of OTC Prilosec, Nexium, and Prevacid.  He is uninsured and therefore, Carafate is likely cost prohibitive.  Will try OTC PPI to start.  Return in 8/1 prior to cycle 3 to discuss PET scan information and provide future treatment recommendations.

## 2015-03-18 NOTE — Progress Notes (Signed)
No PCP Per Patient No address on file  Hodgkin lymphoma - Plan: oxyCODONE-acetaminophen (PERCOCET/ROXICET) 5-325 MG per tablet  CURRENT THERAPY: S/P cycle 1 of ABVD  INTERVAL HISTORY: Robert Acevedo 44 y.o. male returns for followup of Stage III Hodgkin's Lymphoma with questionable involvement of lingula on PET imaging. BMBX negative. PET with mild hyper metabolism with borderline enlarged bilateral axillar and mediastinal nodes. Bilateral inguinal nodes.     Hodgkin lymphoma   01/13/2015 Initial Biopsy L inguinal LN biopsy on 01/13/2015 with Classical Hodgkin Lymphoma, nodular sclerosing type   01/20/2015 Bone Marrow Biopsy normal BMBX, mild polyclonal plasmacytosis   01/28/2015 Imaging PET/CT Mild hyper metabolism is associated with the borderline enlarged bilateral axillary and mediastinal lymph nodes. There is more intense FDG uptake associated with bilateral inguinal lymph nodes. focal area of peripheral consolidation in the lingula.   01/29/2015 Imaging MUGA with EF 69%   01/29/2015 Imaging 1.spirometry is normal .no change with bronchodilator. lung volumes are normal. airway resistance somewhat high. DLCO normal decreased MIP/MEP may indicate weakness of the bellows function of the lung.    02/04/2015 -  Chemotherapy ABVD    PET scan Scheduled for 7/29    I personally reviewed and went over laboratory results with the patient.  The results are noted within this dictation.  He reports some complaints: 1. Epigastric pain.  He notes that it has been ongoing for approximately 2 weeks.  He notes that it is hindering his ability to eat well.  He notes that it is in his upper abdomen.   2. Neulasta-induced bone pain.  He reports that he took Hydrocodone and this was only minimally effective.  He notes that the bone pain was significant for him.  We will try improved pain control.  He denies any nausea or vomiting.  He otherwise is tolerating treatment.   Past Medical History    Diagnosis Date  . Bronchitis   . Lymphadenopathy, inguinal     left  . Hodgkin's lymphoma     has Lytic bone lesion of hip; Abnormal presence of protein in urine; and Hodgkin lymphoma on his problem list.     is allergic to clindamycin/lincomycin.  Current Outpatient Prescriptions on File Prior to Visit  Medication Sig Dispense Refill  . bleomycin (BLEOCIN) 15 UNITS injection Inject into the vein once. To be administered Day 1 & Day 15 every 28 days. To begin 02/04/15.    . citalopram (CELEXA) 20 MG tablet Take 1 tablet daily x 7 days. Then increase to 2 tablets daily. 49 tablet 0  . dacarbazine (DTIC) 200 MG chemo injection Inject into the vein once. To be administered Day 1 & Day 15 every 28 days. To begin 02/04/15.    Marland Kitchen DOXOrubicin HCl (ADRIAMYCIN IV) Inject into the vein. To be administered Day 1 & Day 15 every 28 days. To begin 02/04/15.    Marland Kitchen doxycycline (VIBRA-TABS) 100 MG tablet Take 1 tablet (100 mg total) by mouth 2 (two) times daily. 20 tablet 0  . lidocaine-prilocaine (EMLA) cream Apply a quarter size amount to port site 1 hour prior to chemo. Do not rub in. Cover with plastic wrap. 30 g 3  . LORazepam (ATIVAN) 0.5 MG tablet Take 1 tablet (0.5 mg total) by mouth every 8 (eight) hours as needed for anxiety. 30 tablet 1  . omeprazole (PRILOSEC) 40 MG capsule Take 1 capsule (40 mg total) by mouth daily. 30 capsule 3  . ondansetron (ZOFRAN)  8 MG tablet Take 1 tablet (8 mg total) by mouth every 8 (eight) hours as needed for nausea or vomiting. 60 tablet 1  . prochlorperazine (COMPAZINE) 10 MG tablet Take 1 tablet (10 mg total) by mouth every 6 (six) hours as needed for nausea or vomiting. 60 tablet 1  . vinBLAStine (VELBAN) 10 MG injection Inject into the vein. To be administered Day 1 & Day 15 every 28 days. To begin 02/04/15.    Marland Kitchen ketorolac (ACULAR) 0.5 % ophthalmic solution Place 1 drop into both eyes every 6 (six) hours. (Patient not taking: Reported on 03/18/2015) 3 mL 0   Current  Facility-Administered Medications on File Prior to Visit  Medication Dose Route Frequency Provider Last Rate Last Dose  . bleomycin (BLEOCIN) 19 Units in sodium chloride 0.9 % 50 mL chemo infusion  10 Units/m2 (Treatment Plan Actual) Intravenous Once Patrici Ranks, MD      . dacarbazine (DTIC) 720 mg in sodium chloride 0.9 % 250 mL chemo infusion  375 mg/m2 (Treatment Plan Actual) Intravenous Once Patrici Ranks, MD      . DOXOrubicin (ADRIAMYCIN) chemo injection 48 mg  25 mg/m2 (Treatment Plan Actual) Intravenous Once Patrici Ranks, MD      . fosaprepitant (EMEND) 150 mg, dexamethasone (DECADRON) 12 mg in sodium chloride 0.9 % 145 mL IVPB   Intravenous Once Patrici Ranks, MD      . palonosetron (ALOXI) injection 0.25 mg  0.25 mg Intravenous Once Patrici Ranks, MD      . sodium chloride 0.9 % injection 10 mL  10 mL Intracatheter PRN Patrici Ranks, MD   10 mL at 03/18/15 1121  . vinBLAStine (VELBAN) 11.5 mg in sodium chloride 0.9 % 50 mL chemo infusion  6 mg/m2 (Treatment Plan Actual) Intravenous Once Patrici Ranks, MD        Past Surgical History  Procedure Laterality Date  . Wrist surgery      right  . Knee surgery    . Incision and drainage peritonsillar abscess    . Lymph node biopsy Left 01/13/2015    Procedure: LEFT INGUINAL LYMPH NODE BIOPSY;  Surgeon: Aviva Signs Md, MD;  Location: [facility];  Service: General;  Laterality: Left;  . Bone marrow biopsy Left 01/20/15  . Bone marrow aspiration Left 01/20/15  . Portacath placement Left 01/31/2015    Procedure: INSERTION PORT-A-CATH;  Surgeon: Aviva Signs Md, MD;  Location: [facility];  Service: General;  Laterality: Left;    Denies any headaches, dizziness, double vision, fevers, chills, night sweats, nausea, vomiting, diarrhea, constipation, chest pain, heart palpitations, shortness of breath, blood in stool, black tarry stool, urinary pain, urinary burning, urinary frequency, hematuria.   PHYSICAL EXAMINATION  ECOG  PERFORMANCE STATUS: 1 - Symptomatic but completely ambulatory  Filed Vitals:   03/18/15 0900  BP: 117/81  Pulse: 88  Temp: 98.3 F (36.8 C)  Resp: 16    GENERAL:alert, no distress, well nourished, well developed, comfortable, cooperative, smiling and accompanied by sister SKIN: skin color, texture, turgor are normal, no rashes or significant lesions HEAD: Normocephalic, No masses, lesions, tenderness or abnormalities EYES: normal, PERRLA, EOMI, Conjunctiva are pink and non-injected EARS: External ears normal OROPHARYNX:lips, buccal mucosa, and tongue normal and mucous membranes are moist  NECK: supple, no adenopathy, thyroid normal size, non-tender, without nodularity, no stridor, non-tender, trachea midline LYMPH:  no palpable lymphadenopathy BREAST:not examined LUNGS: clear to auscultation and percussion HEART: regular rate & rhythm, no murmurs and  no gallops ABDOMEN:abdomen soft, normal bowel sounds and tenderness to moderate palpation of epigastric region BACK: Back symmetric, no curvature. EXTREMITIES:less then 2 second capillary refill, no joint deformities, effusion, or inflammation, no edema, no skin discoloration, no clubbing, no cyanosis  NEURO: alert & oriented x 3 with fluent speech, no focal motor/sensory deficits, gait normal   LABORATORY DATA: CBC    Component Value Date/Time   WBC 7.8 03/18/2015 1010   RBC 4.43 03/18/2015 1010   HGB 12.4* 03/18/2015 1010   HCT 38.2* 03/18/2015 1010   PLT 199 03/18/2015 1010   MCV 86.2 03/18/2015 1010   MCH 28.0 03/18/2015 1010   MCHC 32.5 03/18/2015 1010   RDW 14.6 03/18/2015 1010   LYMPHSABS 1.7 03/18/2015 1010   MONOABS 0.8 03/18/2015 1010   EOSABS 0.1 03/18/2015 1010   BASOSABS 0.0 03/18/2015 1010      Chemistry      Component Value Date/Time   NA 138 03/18/2015 1010   K 3.7 03/18/2015 1010   CL 101 03/18/2015 1010   CO2 29 03/18/2015 1010   BUN 12 03/18/2015 1010   CREATININE 1.15 03/18/2015 1010        Component Value Date/Time   CALCIUM 9.1 03/18/2015 1010   ALKPHOS 57 03/18/2015 1010   AST 22 03/18/2015 1010   ALT 18 03/18/2015 1010   BILITOT 0.4 03/18/2015 1010        PENDING LABS:   RADIOGRAPHIC STUDIES:  No results found.   PATHOLOGY:    ASSESSMENT AND PLAN:  Hodgkin lymphoma Stage III Hodgkin's Lymphoma with questionable involvement of lingula on PET imaging. BMBX negative. PET with mild hyper metabolism with borderline enlarged bilateral axillar and mediastinal nodes. Bilateral inguinal nodes.   He began ABVD treatment on 02/04/2015.  Upcoming PET scan scheduled on 7/29 to evaluate response to therapy following 2 cycles of treatment.  Pre-chemo labs as ordered  Rx for Percocet for Neulasta-induced bone pain.  He can also try Aleve up to 4 times daily for bone pain from Neulasta.  I have recommended gastric ulcer dosing of OTC Prilosec, Nexium, and Prevacid.  He is uninsured and therefore, Carafate is likely cost prohibitive.  Will try OTC PPI to start.  Return in 8/1 prior to cycle 3 to discuss PET scan information and provide future treatment recommendations.    THERAPY PLAN:  Continue with treatment as planned.  Upcoming PET scan following this cycle of therapy to evaluate response to therapy.  All questions were answered. The patient knows to call the clinic with any problems, questions or concerns. We can certainly see the patient much sooner if necessary.  Patient and plan discussed with Dr. Ancil Linsey and she is in agreement with the aforementioned.   This note is electronically signed by: Doy Mince 03/18/2015 11:51 AM

## 2015-03-18 NOTE — Patient Instructions (Signed)
..  [place] at [facility] Discharge Instructions  RECOMMENDATIONS MADE BY THE CONSULTANT AND ANY TEST RESULTS WILL BE SENT TO YOUR REFERRING PHYSICIAN.  Exam today per T. Kefalas PA-c  Use either Omeprazole 20 mg twice daily OR Prevacid 30 mgtwice daily  OR Nexium 40 mg daily  Use Aleve 4 times a day for bone pain  Percocet prescription given  neulasta shot tomorrow  Pet scan 7/24 Return 8/1 to see Dr. Whitney Muse  Thank you for choosing [place] at [facility] to provide your oncology and hematology care.  To afford each patient quality time with our provider, please arrive at least 15 minutes before your scheduled appointment time.    You need to re-schedule your appointment should you arrive 10 or more minutes late.  We strive to give you quality time with our providers, and arriving late affects you and other patients whose appointments are after yours.  Also, if you no show three or more times for appointments you may be dismissed from the clinic at the providers discretion.     Again, thank you for choosing [facility].  Our hope is that these requests will decrease the amount of time that you wait before being seen by our physicians.       _____________________________________________________________  Should you have questions after your visit to [facility], please contact our office at [phone] between the hours of 8:30 a.m. and 4:30 p.m.  Voicemails left after 4:30 p.m. will not be returned until the following business day.  For prescription refill requests, have your pharmacy contact our office.

## 2015-03-18 NOTE — Progress Notes (Signed)
[facility] Clinical Social Work    Clinical Social Work was referred by CSW rounding for assessment of psychosocial needs due to past CSW involvement. Clinical Social Worker met with patient at [facility] while he was in exam room. Pt stated things were doing better with his ex-girlfriend and their communication. Pt denied other needs and felt the chemo "was going pretty well".  He shared he was having some issues with certain foods bothering his stomach, but had met with dietician and received some guidance on this. He is aware of how to reach out to CSW as needed.     Clinical Social Work interventions: CSW check in   [place] , LCSW [facility] Tuesdays 8:30-1pm Wednesdays 8:30-12pm  [phone]  

## 2015-03-18 NOTE — Progress Notes (Signed)
Tolerated well

## 2015-03-19 ENCOUNTER — Encounter (HOSPITAL_BASED_OUTPATIENT_CLINIC_OR_DEPARTMENT_OTHER): Payer: Medicaid Other

## 2015-03-19 VITALS — BP 95/50 | HR 81 | Temp 98.3°F | Resp 16

## 2015-03-19 DIAGNOSIS — C8190 Hodgkin lymphoma, unspecified, unspecified site: Secondary | ICD-10-CM

## 2015-03-19 DIAGNOSIS — D709 Neutropenia, unspecified: Secondary | ICD-10-CM | POA: Diagnosis present

## 2015-03-19 MED ORDER — PEGFILGRASTIM INJECTION 6 MG/0.6ML ~~LOC~~
PREFILLED_SYRINGE | SUBCUTANEOUS | Status: AC
  Filled 2015-03-19: qty 0.6

## 2015-03-19 MED ORDER — PEGFILGRASTIM INJECTION 6 MG/0.6ML ~~LOC~~
6.0000 mg | PREFILLED_SYRINGE | Freq: Once | SUBCUTANEOUS | Status: AC
  Administered 2015-03-19: 6 mg via SUBCUTANEOUS

## 2015-03-19 NOTE — Progress Notes (Signed)
Robert Acevedo presents today for injection per MD orders. Neulasta 6mg  administered SQ in left Abdomen. Administration without incident. Patient tolerated well.

## 2015-03-21 ENCOUNTER — Encounter: Payer: Self-pay | Admitting: Dietician

## 2015-03-21 NOTE — Progress Notes (Signed)
Follow up with pt d/t continued wt loss  Contacted Pt by Phone  Wt Readings from Last 10 Encounters:  03/18/15 161 lb 8 oz (73.256 kg)  03/04/15 170 lb (77.111 kg)  02/18/15 169 lb 12.8 oz (77.021 kg)  02/12/15 167 lb 4.8 oz (75.887 kg)  02/04/15 168 lb 9.6 oz (76.476 kg)  02/03/15 168 lb 11.2 oz (76.522 kg)  01/27/15 165 lb (74.844 kg)  01/23/15 165 lb 11.2 oz (75.161 kg)  01/10/15 168 lb (76.204 kg)  01/02/15 173 lb 8 oz (78.699 kg)   Patient weight had increased since I last talk to him (5/27), but then it dropped again in the past couple weeks.  Patient reports oral intake as Fair and is suffering from symptoms including "stomach pressure/bubbles".   Pt explains he has not been eating as well  Recently because he has had a strong sense of stomach pressure. He explained it as feeling as if "someone is standing on his stomach". It sounds like he has some abdominal distention. He reports taking a medication, unsure which one, that helped with this and now that problem is resolved.  He eats atleast B+L each day. Explained to pt that eating something before bed is very important since he wouldn't be eating while he is sleeping either.   He also is snacking on [facility]. I suggested trying a different type of granola bar, preferably one with protein, since those bars have very little protein.  He has been trying to find a protein drink he can tolerate (can not tolerate milk). He asked about Naked Juice. Again, this beverage has very little protein. I Offered the suggestion of adding ice cream to that drink to make a smoothie.   He had questions regarding the protein content of a few other foods/meals. I answered these for him.  Overall, he reports doing pretty well. Will continue to monitor   Burtis Junes RD, LDN Nutrition Pager: [phone] 03/21/2015 1:28 PM

## 2015-03-28 ENCOUNTER — Encounter (HOSPITAL_COMMUNITY): Payer: Self-pay

## 2015-03-28 ENCOUNTER — Encounter (HOSPITAL_COMMUNITY)
Admission: RE | Admit: 2015-03-28 | Discharge: 2015-03-28 | Disposition: A | Discharge status: Home / Self Care | Payer: Medicaid Other | Source: Ambulatory Visit | Attending: Hematology & Oncology | Admitting: Hematology & Oncology

## 2015-03-28 DIAGNOSIS — K029 Dental caries, unspecified: Secondary | ICD-10-CM | POA: Insufficient documentation

## 2015-03-28 DIAGNOSIS — C8190 Hodgkin lymphoma, unspecified, unspecified site: Secondary | ICD-10-CM | POA: Insufficient documentation

## 2015-03-28 LAB — GLUCOSE, CAPILLARY: Glucose-Capillary: 87 mg/dL (ref 65–99)

## 2015-03-28 MED ORDER — FLUDEOXYGLUCOSE F - 18 (FDG) INJECTION
8.0000 | Freq: Once | INTRAVENOUS | Status: AC | PRN
  Administered 2015-03-28: 8 via INTRAVENOUS

## 2015-03-31 ENCOUNTER — Ambulatory Visit (HOSPITAL_COMMUNITY): Payer: Self-pay | Admitting: Hematology & Oncology

## 2015-03-31 ENCOUNTER — Encounter (HOSPITAL_COMMUNITY): Payer: Self-pay

## 2015-03-31 ENCOUNTER — Encounter (HOSPITAL_COMMUNITY): Payer: Medicaid Other | Attending: Hematology & Oncology | Admitting: Oncology

## 2015-03-31 ENCOUNTER — Encounter (HOSPITAL_COMMUNITY): Payer: Self-pay | Admitting: Oncology

## 2015-03-31 ENCOUNTER — Encounter (HOSPITAL_COMMUNITY)
Admission: RE | Admit: 2015-03-31 | Discharge: 2015-03-31 | Disposition: A | Discharge status: Home / Self Care | Payer: Medicaid Other | Source: Ambulatory Visit | Attending: Hematology & Oncology | Admitting: Hematology & Oncology

## 2015-03-31 VITALS — BP 106/86 | HR 73 | Temp 98.0°F | Resp 16 | Wt 171.6 lb

## 2015-03-31 DIAGNOSIS — C8190 Hodgkin lymphoma, unspecified, unspecified site: Secondary | ICD-10-CM

## 2015-03-31 DIAGNOSIS — E8809 Other disorders of plasma-protein metabolism, not elsewhere classified: Secondary | ICD-10-CM | POA: Insufficient documentation

## 2015-03-31 DIAGNOSIS — C8115 Nodular sclerosis classical Hodgkin lymphoma, lymph nodes of inguinal region and lower limb: Secondary | ICD-10-CM

## 2015-03-31 MED ORDER — HEPARIN SOD (PORK) LOCK FLUSH 100 UNIT/ML IV SOLN
INTRAVENOUS | Status: AC
  Filled 2015-03-31: qty 5

## 2015-03-31 MED ORDER — TECHNETIUM TC 99M-LABELED RED BLOOD CELLS IV KIT
25.0000 | PACK | Freq: Once | INTRAVENOUS | Status: AC | PRN
  Administered 2015-03-31: 26.8 via INTRAVENOUS

## 2015-03-31 NOTE — Assessment & Plan Note (Addendum)
Stage III Hodgkin's Lymphoma with questionable involvement of lingula on PET imaging. BMBX negative. PET with mild hypermetabolism with borderline enlarged bilateral axillar and mediastinal nodes. Bilateral inguinal nodes.  Restaging PET following 2 cycles of ABVD demonstrates a complete remission with Deauville 1 noted.  Oncology History is updated.  I personally reviewed and went over radiographic studies with the patient.  The results are noted within this dictation.    I reviewed NCCN guidelines with the patient regarding treatment algorithm.    He will continue with 4 more cycles of ABVD for a total of 6 cycles of treatment.  Will repeat PET within 3 months of completing therapy per NCCN guidelines.  MUGA later today.  Return in 2 weeks for follow-up.

## 2015-03-31 NOTE — Progress Notes (Signed)
No PCP Per Patient No address on file  Hodgkin lymphoma  CURRENT THERAPY: S/P cycle 2 of ABVD  INTERVAL HISTORY: Robert Acevedo [age] y.o. male returns for followup of Stage III Hodgkin's Lymphoma with questionable involvement of lingula on PET imaging. BMBX negative. PET with mild hypermetabolism with borderline enlarged bilateral axillar and mediastinal nodes. Bilateral inguinal nodes.  Restaging PET following 2 cycles of ABVD demonstrates a complete remission with Deauville 1 noted.    Hodgkin lymphoma   01/13/2015 Initial Biopsy L inguinal LN biopsy on 01/13/2015 with Classical Hodgkin Lymphoma, nodular sclerosing type   01/20/2015 Bone Marrow Biopsy normal BMBX, mild polyclonal plasmacytosis   01/28/2015 Imaging PET/CT Mild hyper metabolism is associated with the borderline enlarged bilateral axillary and mediastinal lymph nodes. There is more intense FDG uptake associated with bilateral inguinal lymph nodes. focal area of peripheral consolidation in the lingula.   01/29/2015 Imaging MUGA with EF 69%   01/29/2015 Imaging 1.spirometry is normal .no change with bronchodilator. lung volumes are normal. airway resistance somewhat high. DLCO normal decreased MIP/MEP may indicate weakness of the bellows function of the lung.    02/04/2015 -  Chemotherapy ABVD   03/28/2015 PET scan No adenopathy or abnormal hypermetabolic activity along the previous axillary, mediastinal, and inguinal lymph node chains. Deauville 1.    I personally reviewed and went over laboratory results with the patient.  The results are noted within this dictation.  I personally reviewed and went over radiographic studies with the patient.  The results are noted within this dictation.   He reports that his abdominal pain is much improved with Prilosec.  He is hiding his Dx from his son who accompanied him today.  Therefore, Lyndon does not want pre-chemo labs done today.   I reviewed NCCN guidelines pertaining  to treatment.  He is tolerating therapy very well.  Past Medical History  Diagnosis Date  . Bronchitis   . Lymphadenopathy, inguinal     left  . Hodgkin's lymphoma     has Lytic bone lesion of hip; Abnormal presence of protein in urine; and Hodgkin lymphoma on his problem list.     is allergic to clindamycin/lincomycin.  Current Outpatient Prescriptions on File Prior to Visit  Medication Sig Dispense Refill  . bleomycin (BLEOCIN) 15 UNITS injection Inject into the vein once. To be administered Day 1 & Day 15 every 28 days. To begin 02/04/15.    . citalopram (CELEXA) 20 MG tablet Take 1 tablet daily x 7 days. Then increase to 2 tablets daily. 49 tablet 0  . DOXOrubicin HCl (ADRIAMYCIN IV) Inject into the vein. To be administered Day 1 & Day 15 every 28 days. To begin 02/04/15.    . lidocaine-prilocaine (EMLA) cream Apply a quarter size amount to port site 1 hour prior to chemo. Do not rub in. Cover with plastic wrap. 30 g 3  . LORazepam (ATIVAN) 0.5 MG tablet Take 1 tablet (0.5 mg total) by mouth every 8 (eight) hours as needed for anxiety. 30 tablet 1  . omeprazole (PRILOSEC) 40 MG capsule Take 1 capsule (40 mg total) by mouth daily. 30 capsule 3  . oxyCODONE-acetaminophen (PERCOCET/ROXICET) 5-325 MG per tablet Take 1-2 tablets by mouth every 6 (six) hours as needed for severe pain. Take for Neulasta bone pain 45 tablet 0  . vinBLAStine (VELBAN) 10 MG injection Inject into the vein. To be administered Day 1 & Day 15 every 28 days. To begin 02/04/15.    Marland Kitchen  dacarbazine (DTIC) 200 MG chemo injection Inject into the vein once. To be administered Day 1 & Day 15 every 28 days. To begin 02/04/15.    Marland Kitchen doxycycline (VIBRA-TABS) 100 MG tablet Take 1 tablet (100 mg total) by mouth 2 (two) times daily. (Patient not taking: Reported on 03/31/2015) 20 tablet 0  . ketorolac (ACULAR) 0.5 % ophthalmic solution Place 1 drop into both eyes every 6 (six) hours. (Patient not taking: Reported on 03/18/2015) 3 mL 0  .  ondansetron (ZOFRAN) 8 MG tablet Take 1 tablet (8 mg total) by mouth every 8 (eight) hours as needed for nausea or vomiting. (Patient not taking: Reported on 03/31/2015) 60 tablet 1  . prochlorperazine (COMPAZINE) 10 MG tablet Take 1 tablet (10 mg total) by mouth every 6 (six) hours as needed for nausea or vomiting. (Patient not taking: Reported on 03/31/2015) 60 tablet 1   No current facility-administered medications on file prior to visit.    Past Surgical History  Procedure Laterality Date  . Wrist surgery      right  . Knee surgery    . Incision and drainage peritonsillar abscess    . Lymph node biopsy Left 01/13/2015    Procedure: LEFT INGUINAL LYMPH NODE BIOPSY;  Surgeon: Aviva Signs Md, MD;  Location: [facility];  Service: General;  Laterality: Left;  . Bone marrow biopsy Left 01/20/15  . Bone marrow aspiration Left 01/20/15  . Portacath placement Left 01/31/2015    Procedure: INSERTION PORT-A-CATH;  Surgeon: Aviva Signs Md, MD;  Location: [facility];  Service: General;  Laterality: Left;    Denies any headaches, dizziness, double vision, fevers, chills, night sweats, nausea, vomiting, diarrhea, constipation, chest pain, heart palpitations, shortness of breath, blood in stool, black tarry stool, urinary pain, urinary burning, urinary frequency, hematuria.   PHYSICAL EXAMINATION  ECOG PERFORMANCE STATUS: 1 - Symptomatic but completely ambulatory  Filed Vitals:   03/31/15 0842  BP: 106/86  Pulse: 73  Temp: 98 F (36.7 C)  Resp: 16    GENERAL:alert, no distress, well nourished, well developed, comfortable, cooperative, smiling and accompanied by his son. SKIN: skin color, texture, turgor are normal, no rashes or significant lesions HEAD: Normocephalic, No masses, lesions, tenderness or abnormalities EYES: normal, PERRLA, EOMI, Conjunctiva are pink and non-injected EARS: External ears normal OROPHARYNX:lips, buccal mucosa, and tongue normal and mucous membranes are moist  NECK: supple,  no adenopathy, thyroid normal size, non-tender, without nodularity, no stridor, non-tender, trachea midline LYMPH:  no palpable lymphadenopathy BREAST:not examined LUNGS: clear to auscultation and percussion HEART: regular rate & rhythm, no murmurs and no gallops ABDOMEN:abdomen soft, normal bowel sounds. BACK: Back symmetric, no curvature. EXTREMITIES:less then 2 second capillary refill, no joint deformities, effusion, or inflammation, no edema, no skin discoloration, no clubbing, no cyanosis  NEURO: alert & oriented x 3 with fluent speech, no focal motor/sensory deficits, gait normal   LABORATORY DATA: CBC    Component Value Date/Time   WBC 7.8 03/18/2015 1010   RBC 4.43 03/18/2015 1010   HGB 12.4* 03/18/2015 1010   HCT 38.2* 03/18/2015 1010   PLT 199 03/18/2015 1010   MCV 86.2 03/18/2015 1010   MCH 28.0 03/18/2015 1010   MCHC 32.5 03/18/2015 1010   RDW 14.6 03/18/2015 1010   LYMPHSABS 1.7 03/18/2015 1010   MONOABS 0.8 03/18/2015 1010   EOSABS 0.1 03/18/2015 1010   BASOSABS 0.0 03/18/2015 1010      Chemistry      Component Value Date/Time   NA 138  03/18/2015 1010   Acevedo 3.7 03/18/2015 1010   CL 101 03/18/2015 1010   CO2 29 03/18/2015 1010   BUN 12 03/18/2015 1010   CREATININE 1.15 03/18/2015 1010      Component Value Date/Time   CALCIUM 9.1 03/18/2015 1010   ALKPHOS 57 03/18/2015 1010   AST 22 03/18/2015 1010   ALT 18 03/18/2015 1010   BILITOT 0.4 03/18/2015 1010        PENDING LABS:   RADIOGRAPHIC STUDIES:  Nm Pet Image Restag (ps) Skull Base To Thigh  03/28/2015   CLINICAL DATA:  Subsequent treatment strategy for Hodgkin's lymphoma.  EXAM: NUCLEAR MEDICINE PET SKULL BASE TO THIGH  TECHNIQUE: 8.0 mCi F-18 FDG was injected intravenously. Full-ring PET imaging was performed from the skull base to thigh after the radiotracer. CT data was obtained and used for attenuation correction and anatomic localization.  FASTING BLOOD GLUCOSE:  Value: 87 mg/dl  COMPARISON:   Multiple exams, including 01/28/2015  FINDINGS: NECK  Asymmetry along the tonsillar pillars, left measuring 5.3 and right 3.8, probably incidental, not appreciably changed from prior.  Incidentally there is periapical lucency associated with the more lateral right maxillary premolar along with a considerable dental cavity. Other right molar dental cavities are also present.  CHEST  The prior axillary lymph nodes are no longer enlarged and no longer hypermetabolic. There are only tiny residual normal sized nodes along the axillary and subpectoral chains currently. No pathologic mediastinal adenopathy. For Deauville documentation purposes, mediastinal background vascular activity is 1.8 and representative hepatic activity is 2.2 in standard uptake value. The remaining tiny left axillary lymph nodes have a maximum standard uptake value of approximately 0.9, not above background.  ABDOMEN/PELVIS  No abnormal hypermetabolic activity within the liver, pancreas, adrenal glands, or spleen. No hypermetabolic lymph nodes in the abdomen or pelvis. Specifically, the hypermetabolic inguinal lymph nodes have resolved.  SKELETON  Diffuse increased activity throughout the skeleton. Lucent lesions observed in the pelvis, once again not hypermetabolic.  IMPRESSION: 1. No adenopathy or abnormal hypermetabolic activity along the previous axillary, mediastinal, and inguinal lymph node chains. Deauville 1. 2. Asymmetry along the tonsillar pillars, left greater than right, probably incidental/physiologic. 3. Diffuse increased activity throughout the skeleton, likely from marrow stimulation therapy. 4. Right maxillary tooth decay, including a periapical lucency involving the lateral right maxillary premolar. Consider dental referral.   Electronically Signed   By: Van Clines M.D.   On: 03/28/2015 13:24     PATHOLOGY:    ASSESSMENT AND PLAN:  Hodgkin lymphoma Stage III Hodgkin's Lymphoma with questionable involvement of  lingula on PET imaging. BMBX negative. PET with mild hypermetabolism with borderline enlarged bilateral axillar and mediastinal nodes. Bilateral inguinal nodes.  Restaging PET following 2 cycles of ABVD demonstrates a complete remission with Deauville 1 noted.  Oncology History is updated.  I personally reviewed and went over radiographic studies with the patient.  The results are noted within this dictation.    I reviewed NCCN guidelines with the patient regarding treatment algorithm.    He will continue with 4 more cycles of ABVD for a total of 6 cycles of treatment.  Will repeat PET within 3 months of completing therapy per NCCN guidelines.  MUGA later today.  Return in 2 weeks for follow-up.    THERAPY PLAN:  Continue with treatment as planned per NCCN guidelines.    All questions were answered. The patient knows to call the clinic with any problems, questions or concerns. We can certainly see  the patient much sooner if necessary.  Patient and plan discussed with Dr. Ancil Linsey and she is in agreement with the aforementioned.   This note is electronically signed by: Robynn Pane, PA-C 03/31/2015 9:01 AM

## 2015-03-31 NOTE — Patient Instructions (Signed)
[place] at [facility] Discharge Instructions  RECOMMENDATIONS MADE BY THE CONSULTANT AND ANY TEST RESULTS WILL BE SENT TO YOUR REFERRING PHYSICIAN.  Exam and discussion by Robynn Pane, PA-C Your PET scan showed complete response to therapy.  You will get 4 more treatments and will treat you tomorrow. Report fevers, uncontrolled nausea, vomiting or other concerns.  Follow-up: MUGA today as scheduled Labs and Chemotherapy tomorrow Office visit in 2 weeks and 4 weeks.  Thank you for choosing [place] at [facility] to provide your oncology and hematology care.  To afford each patient quality time with our provider, please arrive at least 15 minutes before your scheduled appointment time.    You need to re-schedule your appointment should you arrive 10 or more minutes late.  We strive to give you quality time with our providers, and arriving late affects you and other patients whose appointments are after yours.  Also, if you no show three or more times for appointments you may be dismissed from the clinic at the providers discretion.     Again, thank you for choosing [facility].  Our hope is that these requests will decrease the amount of time that you wait before being seen by our physicians.       _____________________________________________________________  Should you have questions after your visit to [facility], please contact our office at [phone] between the hours of 8:30 a.m. and 4:30 p.m.  Voicemails left after 4:30 p.m. will not be returned until the following business day.  For prescription refill requests, have your pharmacy contact our office.

## 2015-04-01 ENCOUNTER — Encounter (HOSPITAL_COMMUNITY): Payer: Self-pay

## 2015-04-01 ENCOUNTER — Encounter (HOSPITAL_BASED_OUTPATIENT_CLINIC_OR_DEPARTMENT_OTHER): Payer: Medicaid Other

## 2015-04-01 VITALS — BP 112/57 | HR 56 | Temp 97.9°F | Resp 18 | Wt 172.0 lb

## 2015-04-01 DIAGNOSIS — E8809 Other disorders of plasma-protein metabolism, not elsewhere classified: Secondary | ICD-10-CM | POA: Diagnosis not present

## 2015-04-01 DIAGNOSIS — C8115 Nodular sclerosis classical Hodgkin lymphoma, lymph nodes of inguinal region and lower limb: Secondary | ICD-10-CM

## 2015-04-01 DIAGNOSIS — Z5111 Encounter for antineoplastic chemotherapy: Secondary | ICD-10-CM

## 2015-04-01 DIAGNOSIS — C8190 Hodgkin lymphoma, unspecified, unspecified site: Secondary | ICD-10-CM

## 2015-04-01 LAB — COMPREHENSIVE METABOLIC PANEL
ALT: 18 U/L (ref 17–63)
AST: 22 U/L (ref 15–41)
Albumin: 3.3 g/dL — ABNORMAL LOW (ref 3.5–5.0)
Alkaline Phosphatase: 59 U/L (ref 38–126)
Anion gap: 6 (ref 5–15)
BILIRUBIN TOTAL: 0.3 mg/dL (ref 0.3–1.2)
BUN: 13 mg/dL (ref 6–20)
CO2: 29 mmol/L (ref 22–32)
Calcium: 8.7 mg/dL — ABNORMAL LOW (ref 8.9–10.3)
Chloride: 104 mmol/L (ref 101–111)
Creatinine, Ser: 1.07 mg/dL (ref 0.61–1.24)
GFR calc Af Amer: >60 mL/min (ref >60)
GLUCOSE: 92 mg/dL (ref 65–99)
Potassium: 3.7 mmol/L (ref 3.5–5.1)
SODIUM: 139 mmol/L (ref 135–145)
TOTAL PROTEIN: 7.4 g/dL (ref 6.5–8.1)

## 2015-04-01 LAB — CBC WITH DIFFERENTIAL/PLATELET
Basophils Absolute: 0.1 10*3/uL (ref 0.0–0.1)
Basophils Relative: 1 % (ref 0–1)
Eosinophils Absolute: 0.3 10*3/uL (ref 0.0–0.7)
Eosinophils Relative: 3 % (ref 0–5)
HCT: 34.2 % — ABNORMAL LOW (ref 39.0–52.0)
Hemoglobin: 11 g/dL — ABNORMAL LOW (ref 13.0–17.0)
LYMPHS ABS: 1.8 10*3/uL (ref 0.7–4.0)
Lymphocytes Relative: 18 % (ref 12–46)
MCH: 28.5 pg (ref 26.0–34.0)
MCHC: 32.2 g/dL (ref 30.0–36.0)
MCV: 88.6 fL (ref 78.0–100.0)
MONO ABS: 1.2 10*3/uL — AB (ref 0.1–1.0)
Monocytes Relative: 12 % (ref 3–12)
Neutro Abs: 6.8 10*3/uL (ref 1.7–7.7)
Neutrophils Relative %: 66 % (ref 43–77)
PLATELETS: 213 10*3/uL (ref 150–400)
RBC: 3.86 MIL/uL — ABNORMAL LOW (ref 4.22–5.81)
RDW: 16.2 % — ABNORMAL HIGH (ref 11.5–15.5)
WBC: 10.1 10*3/uL (ref 4.0–10.5)

## 2015-04-01 MED ORDER — DOXORUBICIN HCL CHEMO IV INJECTION 2 MG/ML
25.0000 mg/m2 | Freq: Once | INTRAVENOUS | Status: AC
  Administered 2015-04-01: 48 mg via INTRAVENOUS
  Filled 2015-04-01: qty 24

## 2015-04-01 MED ORDER — PALONOSETRON HCL INJECTION 0.25 MG/5ML
INTRAVENOUS | Status: AC
  Filled 2015-04-01: qty 5

## 2015-04-01 MED ORDER — HEPARIN SOD (PORK) LOCK FLUSH 100 UNIT/ML IV SOLN
INTRAVENOUS | Status: AC
  Filled 2015-04-01: qty 5

## 2015-04-01 MED ORDER — BLEOMYCIN SULFATE CHEMO INJECTION 30 UNIT
10.0000 [IU]/m2 | Freq: Once | INTRAMUSCULAR | Status: AC
  Administered 2015-04-01: 19 [IU] via INTRAVENOUS
  Filled 2015-04-01: qty 6.33

## 2015-04-01 MED ORDER — HEPARIN SOD (PORK) LOCK FLUSH 100 UNIT/ML IV SOLN
500.0000 [IU] | Freq: Once | INTRAVENOUS | Status: AC | PRN
  Administered 2015-04-01: 500 [IU]

## 2015-04-01 MED ORDER — SODIUM CHLORIDE 0.9 % IV SOLN
Freq: Once | INTRAVENOUS | Status: AC
Start: 2015-04-01 — End: 2015-04-01
  Administered 2015-04-01: 12:00:00 via INTRAVENOUS

## 2015-04-01 MED ORDER — SODIUM CHLORIDE 0.9 % IV SOLN
Freq: Once | INTRAVENOUS | Status: AC
  Administered 2015-04-01: 12:00:00 via INTRAVENOUS
  Filled 2015-04-01: qty 5

## 2015-04-01 MED ORDER — PALONOSETRON HCL INJECTION 0.25 MG/5ML
0.2500 mg | Freq: Once | INTRAVENOUS | Status: AC
  Administered 2015-04-01: 0.25 mg via INTRAVENOUS

## 2015-04-01 MED ORDER — VINBLASTINE SULFATE CHEMO INJECTION 1 MG/ML
6.0000 mg/m2 | Freq: Once | INTRAVENOUS | Status: AC
  Administered 2015-04-01: 11.5 mg via INTRAVENOUS
  Filled 2015-04-01: qty 11.5

## 2015-04-01 MED ORDER — SODIUM CHLORIDE 0.9 % IJ SOLN: 10.0000 mL | INTRAMUSCULAR | Status: DC | PRN

## 2015-04-01 MED ORDER — DACARBAZINE 200 MG IV SOLR
375.0000 mg/m2 | Freq: Once | INTRAVENOUS | Status: AC
  Administered 2015-04-01: 720 mg via INTRAVENOUS
  Filled 2015-04-01: qty 36

## 2015-04-01 NOTE — Patient Instructions (Addendum)
[facility] Discharge Instructions for Patients Receiving Chemotherapy  Today you received the following chemotherapy agents bleomycin, dacarbazine, vinblastine, and doxorubicin Return tomorrow for your neulasta injection Please call the clinic if you have any questions or concerns   To help prevent nausea and vomiting after your treatment, we encourage you to take your nausea medication    If you develop nausea and vomiting, or diarrhea that is not controlled by your medication, call the clinic.  The clinic phone number is [phone]. Office hours are Monday-Friday 8:30am-5:00pm.  BELOW ARE SYMPTOMS THAT SHOULD BE REPORTED IMMEDIATELY:  *FEVER GREATER THAN 101.0 F  *CHILLS WITH OR WITHOUT FEVER  NAUSEA AND VOMITING THAT IS NOT CONTROLLED WITH YOUR NAUSEA MEDICATION  *UNUSUAL SHORTNESS OF BREATH  *UNUSUAL BRUISING OR BLEEDING  TENDERNESS IN MOUTH AND THROAT WITH OR WITHOUT PRESENCE OF ULCERS  *URINARY PROBLEMS  *BOWEL PROBLEMS  UNUSUAL RASH Items with * indicate a potential emergency and should be followed up as soon as possible. If you have an emergency after office hours please contact your primary care physician or go to the nearest emergency department.  Please call the clinic during office hours if you have any questions or concerns.   You may also contact the Patient Navigator at [phone] should you have any questions or need assistance in obtaining follow up care. _____________________________________________________________________ Have you asked about our STAR program?    STAR stands for Survivorship Training and Rehabilitation, and this is a nationally recognized cancer care program that focuses on survivorship and rehabilitation.  Cancer and cancer treatments may cause problems, such as, pain, making you feel tired and keeping you from doing the things that you need or want to do. Cancer rehabilitation can help. Our goal is to reduce these  troubling effects and help you have the best quality of life possible.  You may receive a survey from a nurse that asks questions about your current state of health.  Based on the survey results, all eligible patients will be referred to the [facility] program for an evaluation so we can better serve you! A frequently asked questions sheet is available upon request.

## 2015-04-01 NOTE — Progress Notes (Signed)
Marcelino K Pendergraft Tolerated chemotherapy discharged ambulatory

## 2015-04-15 ENCOUNTER — Encounter (HOSPITAL_BASED_OUTPATIENT_CLINIC_OR_DEPARTMENT_OTHER): Payer: Medicaid Other

## 2015-04-15 ENCOUNTER — Encounter (HOSPITAL_COMMUNITY): Payer: Self-pay | Admitting: Hematology & Oncology

## 2015-04-15 ENCOUNTER — Encounter (HOSPITAL_BASED_OUTPATIENT_CLINIC_OR_DEPARTMENT_OTHER): Payer: Medicaid Other | Admitting: Hematology & Oncology

## 2015-04-15 VITALS — BP 101/58 | HR 82 | Temp 98.2°F | Resp 16 | Wt 170.0 lb

## 2015-04-15 VITALS — BP 106/69 | HR 77 | Temp 98.0°F | Resp 16

## 2015-04-15 DIAGNOSIS — Z720 Tobacco use: Secondary | ICD-10-CM

## 2015-04-15 DIAGNOSIS — D890 Polyclonal hypergammaglobulinemia: Secondary | ICD-10-CM

## 2015-04-15 DIAGNOSIS — E8809 Other disorders of plasma-protein metabolism, not elsewhere classified: Secondary | ICD-10-CM | POA: Diagnosis not present

## 2015-04-15 DIAGNOSIS — C8190 Hodgkin lymphoma, unspecified, unspecified site: Secondary | ICD-10-CM

## 2015-04-15 DIAGNOSIS — M899 Disorder of bone, unspecified: Secondary | ICD-10-CM

## 2015-04-15 DIAGNOSIS — G4700 Insomnia, unspecified: Secondary | ICD-10-CM

## 2015-04-15 DIAGNOSIS — C8115 Nodular sclerosis classical Hodgkin lymphoma, lymph nodes of inguinal region and lower limb: Secondary | ICD-10-CM

## 2015-04-15 DIAGNOSIS — Z5111 Encounter for antineoplastic chemotherapy: Secondary | ICD-10-CM

## 2015-04-15 DIAGNOSIS — F1290 Cannabis use, unspecified, uncomplicated: Secondary | ICD-10-CM

## 2015-04-15 LAB — CBC WITH DIFFERENTIAL/PLATELET
BASOS ABS: 0 10*3/uL (ref 0.0–0.1)
Basophils Relative: 1 % (ref 0–1)
Eosinophils Absolute: 0.2 10*3/uL (ref 0.0–0.7)
Eosinophils Relative: 7 % — ABNORMAL HIGH (ref 0–5)
HCT: 33 % — ABNORMAL LOW (ref 39.0–52.0)
Hemoglobin: 10.8 g/dL — ABNORMAL LOW (ref 13.0–17.0)
LYMPHS PCT: 30 % (ref 12–46)
Lymphs Abs: 0.9 10*3/uL (ref 0.7–4.0)
MCH: 28.7 pg (ref 26.0–34.0)
MCHC: 32.7 g/dL (ref 30.0–36.0)
MCV: 87.8 fL (ref 78.0–100.0)
MONOS PCT: 18 % — AB (ref 3–12)
Monocytes Absolute: 0.6 10*3/uL (ref 0.1–1.0)
NEUTROS ABS: 1.4 10*3/uL — AB (ref 1.7–7.7)
Neutrophils Relative %: 45 % (ref 43–77)
Platelets: 238 10*3/uL (ref 150–400)
RBC: 3.76 MIL/uL — ABNORMAL LOW (ref 4.22–5.81)
RDW: 16.7 % — AB (ref 11.5–15.5)
WBC: 3.1 10*3/uL — ABNORMAL LOW (ref 4.0–10.5)

## 2015-04-15 LAB — COMPREHENSIVE METABOLIC PANEL
ALT: 18 U/L (ref 17–63)
AST: 20 U/L (ref 15–41)
Albumin: 3.6 g/dL (ref 3.5–5.0)
Alkaline Phosphatase: 44 U/L (ref 38–126)
Anion gap: 5 (ref 5–15)
BUN: 10 mg/dL (ref 6–20)
CO2: 31 mmol/L (ref 22–32)
CREATININE: 1.05 mg/dL (ref 0.61–1.24)
Calcium: 8.7 mg/dL — ABNORMAL LOW (ref 8.9–10.3)
Chloride: 104 mmol/L (ref 101–111)
GFR calc Af Amer: >60 mL/min (ref >60)
GFR calc non Af Amer: >60 mL/min (ref >60)
Glucose, Bld: 91 mg/dL (ref 65–99)
POTASSIUM: 3.7 mmol/L (ref 3.5–5.1)
Sodium: 140 mmol/L (ref 135–145)
TOTAL PROTEIN: 7.4 g/dL (ref 6.5–8.1)
Total Bilirubin: 0.4 mg/dL (ref 0.3–1.2)

## 2015-04-15 MED ORDER — SODIUM CHLORIDE 0.9 % IV SOLN
10.0000 [IU]/m2 | Freq: Once | INTRAVENOUS | Status: AC
  Administered 2015-04-15: 19 [IU] via INTRAVENOUS
  Filled 2015-04-15: qty 6.33

## 2015-04-15 MED ORDER — SODIUM CHLORIDE 0.9 % IJ SOLN: 10.0000 mL | INTRAMUSCULAR | Status: DC | PRN

## 2015-04-15 MED ORDER — DOXORUBICIN HCL CHEMO IV INJECTION 2 MG/ML
25.0000 mg/m2 | Freq: Once | INTRAVENOUS | Status: AC
  Administered 2015-04-15: 48 mg via INTRAVENOUS
  Filled 2015-04-15: qty 24

## 2015-04-15 MED ORDER — DACARBAZINE 200 MG IV SOLR
375.0000 mg/m2 | Freq: Once | INTRAVENOUS | Status: AC
  Administered 2015-04-15: 720 mg via INTRAVENOUS
  Filled 2015-04-15: qty 36

## 2015-04-15 MED ORDER — VINBLASTINE SULFATE CHEMO INJECTION 1 MG/ML
6.0000 mg/m2 | Freq: Once | INTRAVENOUS | Status: AC
  Administered 2015-04-15: 11.5 mg via INTRAVENOUS
  Filled 2015-04-15: qty 11.5

## 2015-04-15 MED ORDER — PALONOSETRON HCL INJECTION 0.25 MG/5ML
0.2500 mg | Freq: Once | INTRAVENOUS | Status: AC
  Administered 2015-04-15: 0.25 mg via INTRAVENOUS
  Filled 2015-04-15: qty 5

## 2015-04-15 MED ORDER — SODIUM CHLORIDE 0.9 % IV SOLN
Freq: Once | INTRAVENOUS | Status: AC
  Administered 2015-04-15: 09:00:00 via INTRAVENOUS

## 2015-04-15 MED ORDER — HEPARIN SOD (PORK) LOCK FLUSH 100 UNIT/ML IV SOLN
500.0000 [IU] | Freq: Once | INTRAVENOUS | Status: AC | PRN
  Administered 2015-04-15: 500 [IU]
  Filled 2015-04-15: qty 5

## 2015-04-15 MED ORDER — SODIUM CHLORIDE 0.9 % IV SOLN
Freq: Once | INTRAVENOUS | Status: AC
  Administered 2015-04-15: 11:00:00 via INTRAVENOUS
  Filled 2015-04-15: qty 5

## 2015-04-15 MED ORDER — AMITRIPTYLINE HCL 25 MG PO TABS: ORAL_TABLET | ORAL | Status: DC

## 2015-04-15 NOTE — Patient Instructions (Addendum)
[place] at [facility] Discharge Instructions  RECOMMENDATIONS MADE BY THE CONSULTANT AND ANY TEST RESULTS WILL BE SENT TO YOUR REFERRING PHYSICIAN.  Chemotherapy today as planned pending lab results. NO NEULASTA injection with this chemo treatment. MD appointment in 2 weeks for follow-up. Return as scheduled.  Thank you for choosing [place] at [facility] to provide your oncology and hematology care.  To afford each patient quality time with our provider, please arrive at least 15 minutes before your scheduled appointment time.    You need to re-schedule your appointment should you arrive 10 or more minutes late.  We strive to give you quality time with our providers, and arriving late affects you and other patients whose appointments are after yours.  Also, if you no show three or more times for appointments you may be dismissed from the clinic at the providers discretion.     Again, thank you for choosing [facility].  Our hope is that these requests will decrease the amount of time that you wait before being seen by our physicians.       _____________________________________________________________  Should you have questions after your visit to [facility], please contact our office at [phone] between the hours of 8:30 a.m. and 4:30 p.m.  Voicemails left after 4:30 p.m. will not be returned until the following business day.  For prescription refill requests, have your pharmacy contact our office.

## 2015-04-15 NOTE — Progress Notes (Signed)
[facility] [place] NOTE  Patient Care Team: No Pcp Per Patient as PCP - General (General Practice)  CHIEF COMPLAINTS/PURPOSE OF CONSULTATION:  Hodgkin lymphoma   Staging form: Lymphoid Neoplasms, AJCC 6th Edition     Clinical stage from 03/04/2015: Stage III - Unsigned       Staging comments:          Questionable involvement of lingula on PET imaging. BMBX negative. PET with mild hyper metabolism with borderline enlarged bilateral axillar and mediastinal nodes. Bilateral inguinal nodes.   NOTE: patient has lytic lesion in the pelvis that are NO         T PET avid. Not biopsied    L inquinal LN biopsy on 01/13/2015 with final pathology showing Classical Hodgkin Lymphoma, nodular sclerosing type Bone marrow biopsy 01/20/2015 without evidence of malignancy    Hodgkin lymphoma   01/13/2015 Initial Biopsy L inguinal LN biopsy on 01/13/2015 with Classical Hodgkin Lymphoma, nodular sclerosing type   01/20/2015 Bone Marrow Biopsy normal BMBX, mild polyclonal plasmacytosis   01/28/2015 Imaging PET/CT Mild hyper metabolism is associated with the borderline enlarged bilateral axillary and mediastinal lymph nodes. There is more intense FDG uptake associated with bilateral inguinal lymph nodes. focal area of peripheral consolidation in the lingula.   01/29/2015 Imaging MUGA with EF 69%   01/29/2015 Imaging 1.spirometry is normal .no change with bronchodilator. lung volumes are normal. airway resistance somewhat high. DLCO normal decreased MIP/MEP may indicate weakness of the bellows function of the lung.    02/04/2015 -  Chemotherapy ABVD   03/28/2015 PET scan No adenopathy or abnormal hypermetabolic activity along the previous axillary, mediastinal, and inguinal lymph node chains. Deauville 1.     HISTORY OF PRESENTING ILLNESS:  Robert Acevedo 44 y.o. male is here for additional follow-up of Hodgkin Lymphoma.   He is present alone today and likes to be called  [name] He denies nausea, vomiting, diarrhea or constipation. He denies fever or chills.  Patient is here with his father today.  He reports that his mood has been "off and on, sort of."  He thinks that overall he is doing fairly well.  He reports that he's been trying to eat two to three meals per day. His weight has been stable throughout his treatments.  Ativan is not working for sleep. He would like a medication for sleep and to help with his appetite.   MEDICAL HISTORY:  Past Medical History  Diagnosis Date  . Bronchitis   . Lymphadenopathy, inguinal     left  . Hodgkin's lymphoma     SURGICAL HISTORY: Past Surgical History  Procedure Laterality Date  . Wrist surgery      right  . Knee surgery    . Incision and drainage peritonsillar abscess    . Lymph node biopsy Left 01/13/2015    Procedure: LEFT INGUINAL LYMPH NODE BIOPSY;  Surgeon: Aviva Signs Md, MD;  Location: [facility];  Service: General;  Laterality: Left;  . Bone marrow biopsy Left 01/20/15  . Bone marrow aspiration Left 01/20/15  . Portacath placement Left 01/31/2015    Procedure: INSERTION PORT-A-CATH;  Surgeon: Aviva Signs Md, MD;  Location: [facility];  Service: General;  Laterality: Left;    SOCIAL HISTORY: Social History   Social History  . Marital Status: Single    Spouse Name: N/A  . Number of Children: N/A  . Years of Education: N/A   Occupational History  . Not on file.  Social History Main Topics  . Smoking status: Current Every Day Smoker -- 0.50 packs/day for 2 years    Types: Cigarettes  . Smokeless tobacco: Never Used  . Alcohol Use: Yes     Comment: occasional  . Drug Use: Yes    Special: Marijuana     Comment: on occasion - last used 01/26/15  . Sexual Activity: Yes    Birth Control/ Protection: None   Other Topics Concern  . Not on file   Social History Narrative  Pt is a smoker starting 2008, pack of cigarettes a week. Infrequent drinking alcohol, maybe one or two once a month.  Denies drug use other than daily marijuana. In jail for 18 months.  Has 3 kids, 63 yo, 33 yo, and 26 yo. Has 1 grandchild. He has a car.    FAMILY HISTORY: Family History  Problem Relation Age of Onset  . Seizures Mother   . Diabetes Father   Father is 66 yo diabetic. Mother is 45 yo. Both still living. 1 brother, 4 sisters. One sister has breast cancer, diagnosed 1996 in her 61s. Another sister has severe depression. He has been with his girlfriend 9-10 years. He lives with his girlfriend and his father. He hasn't told his father about his diagnosis yet. One sister lives in [place] and she is aware of his diagnosis. He told two of his childhood friends about his diagnosis.   ALLERGIES:  is allergic to clindamycin/lincomycin.  MEDICATIONS:  Current Outpatient Prescriptions  Medication Sig Dispense Refill  . bleomycin (BLEOCIN) 15 UNITS injection Inject into the vein once. To be administered Day 1 & Day 15 every 28 days. To begin 02/04/15.    . citalopram (CELEXA) 20 MG tablet Take 1 tablet daily x 7 days. Then increase to 2 tablets daily. 49 tablet 0  . dacarbazine (DTIC) 200 MG chemo injection Inject into the vein once. To be administered Day 1 & Day 15 every 28 days. To begin 02/04/15.    Marland Kitchen DOXOrubicin HCl (ADRIAMYCIN IV) Inject into the vein. To be administered Day 1 & Day 15 every 28 days. To begin 02/04/15.    . lidocaine-prilocaine (EMLA) cream Apply a quarter size amount to port site 1 hour prior to chemo. Do not rub in. Cover with plastic wrap. 30 g 3  . LORazepam (ATIVAN) 0.5 MG tablet Take 1 tablet (0.5 mg total) by mouth every 8 (eight) hours as needed for anxiety. 30 tablet 1  . omeprazole (PRILOSEC) 40 MG capsule Take 1 capsule (40 mg total) by mouth daily. 30 capsule 3  . ondansetron (ZOFRAN) 8 MG tablet Take 1 tablet (8 mg total) by mouth every 8 (eight) hours as needed for nausea or vomiting. 60 tablet 1  . oxyCODONE-acetaminophen (PERCOCET/ROXICET) 5-325 MG per tablet Take 1-2  tablets by mouth every 6 (six) hours as needed for severe pain. Take for Neulasta bone pain 45 tablet 0  . vinBLAStine (VELBAN) 10 MG injection Inject into the vein. To be administered Day 1 & Day 15 every 28 days. To begin 02/04/15.    Marland Kitchen amitriptyline (ELAVIL) 25 MG tablet Take one tablet at bedtime for 5 days then increase to two tablets thereafter 60 tablet 3  . doxycycline (VIBRA-TABS) 100 MG tablet Take 1 tablet (100 mg total) by mouth 2 (two) times daily. (Patient not taking: Reported on 03/31/2015) 20 tablet 0  . ketorolac (ACULAR) 0.5 % ophthalmic solution Place 1 drop into both eyes every 6 (six) hours. (Patient not  taking: Reported on 03/18/2015) 3 mL 0  . prochlorperazine (COMPAZINE) 10 MG tablet Take 1 tablet (10 mg total) by mouth every 6 (six) hours as needed for nausea or vomiting. (Patient not taking: Reported on 03/31/2015) 60 tablet 1   No current facility-administered medications for this visit.   Facility-Administered Medications Ordered in Other Visits  Medication Dose Route Frequency Provider Last Rate Last Dose  . sodium chloride 0.9 % injection 10 mL  10 mL Intracatheter PRN Patrici Ranks, MD        Review of Systems  Constitutional: Negative for weight loss and diaphoresis. Negative for fever, chills and malaise/fatigue.  HENT: Negative for congestion, ear discharge, ear pain, hearing loss, nosebleeds, sore throat and tinnitus.   Eyes: Negative for blurred vision. Negative for double vision, photophobia, pain, discharge and redness.  Respiratory: Negative.  Negative for stridor.   Cardiovascular: Negative.   Gastrointestinal: Negative for heartburn, nausea, vomiting, diarrhea, blood in stool and melena. Genitourinary: Negative for dysuria, frequency, hematuria and flank pain.  Musculoskeletal: Negative.   Skin: Negative.   Neurological: Negative for dizziness, tingling, tremors, sensory change, speech change, focal weakness, seizures, loss of consciousness and weakness.   Endo/Heme/Allergies: Negative.   Psychiatric/Behavioral: Positive for substance abuse. Negative for depression, suicidal ideas, hallucinations and memory loss. The patient has insomnia. The patient is not nervous/anxious.    14 point ROS was done and is otherwise as detailed above or in HPI    PHYSICAL EXAMINATION: ECOG PERFORMANCE STATUS: 1 - Symptomatic but completely ambulatory  Filed Vitals:   04/15/15 0909  BP: 101/58  Pulse: 82  Temp: 98.2 F (36.8 C)  Resp: 16   Filed Weights   04/15/15 0909  Weight: 170 lb (77.111 kg)   Physical Exam  Constitutional: He is oriented to person, place, and time and well-developed, well-nourished, and in no distress.  HENT:  Head: Normocephalic and atraumatic.  Nose: Nose normal.  Mouth/Throat: Oropharynx is clear and moist. No oropharyngeal exudate.  Eyes: Conjunctivae and EOM are normal. Pupils are equal, round, and reactive to light. Right eye exhibits no discharge. Left eye exhibits no discharge. No scleral icterus.  Neck: Normal range of motion. Neck supple. No tracheal deviation present. No thyromegaly present.  Cardiovascular: Normal rate, regular rhythm and normal heart sounds.  Exam reveals no gallop and no friction rub.  No murmur heard. Pulmonary/Chest: Effort normal and breath sounds normal. He has no wheezes. He has no rales. Port a cath accessed L chest Abdominal: Soft. Bowel sounds are normal. He exhibits no distension and no mass. There is no tenderness. There is no rebound and no guarding.  Genitourinary:  deferred Musculoskeletal: Normal range of motion. He exhibits no edema.   Neurological: He is alert and oriented to person, place, and time. He has normal reflexes. No cranial nerve deficit. Gait normal. Coordination normal.  Skin: Skin is warm and dry. No rash noted.  Psychiatric: Mood, memory, affect and judgment normal.  Nursing note and vitals reviewed.   LABORATORY DATA:  I have reviewed the data as listed Lab  Results  Component Value Date   WBC 3.1* 04/15/2015   HGB 10.8* 04/15/2015   HCT 33.0* 04/15/2015   MCV 87.8 04/15/2015   PLT 238 04/15/2015   CMP     Component Value Date/Time   NA 140 04/15/2015 0859   K 3.7 04/15/2015 0859   CL 104 04/15/2015 0859   CO2 31 04/15/2015 0859   GLUCOSE 91 04/15/2015 0859  BUN 10 04/15/2015 0859   CREATININE 1.05 04/15/2015 0859   CALCIUM 8.7* 04/15/2015 0859   PROT 7.4 04/15/2015 0859   ALBUMIN 3.6 04/15/2015 0859   AST 20 04/15/2015 0859   ALT 18 04/15/2015 0859   ALKPHOS 44 04/15/2015 0859   BILITOT 0.4 04/15/2015 0859   GFRNONAA >60 04/15/2015 0859   GFRAA >60 04/15/2015 0859     PATHOLOGY:  BMBXIAGNOSIS Diagnosis Bone Marrow, Aspirate,Biopsy, and Clot, left iliac - NORMOCELLULAR BONE MARROW FOR AGE WITH TRILINEAGE HEMATOPOIESIS. - MILD POLYCLONAL PLASMACYTOSIS - NO TUMOR IDENTIFIED. PERIPHERAL BLOOD: - NORMOCYTIC-NORMOCHROMIC ANEMIA. Diagnosis Note There is no morphologic evidence of involvement by Hodgkin lymphoma in this material. Clinical correlation is recommended. (BNS:ecj 01/22/2015) Susanne Greenhouse MD Pathologist, Electronic Signature  LYMPH NODE BIOPSY Diagnosis Lymph node for lymphoma, left inguinal - CLASSICAL HODGKIN LYMPHOMA, NODULAR SCLEROSIS TYPE. - SEE ONCOLOGY TABLE. Microscopic Comment LYMPHOMA Histologic type: Classical Hodgkin lymphoma, nodular sclerosis type. Grade (if applicable): N/A. Flow cytometry: Not performed. Immunohistochemical stains: CD45, CD20, CD79a, CD3, CD30, and CD15. Touch preps/imprints: Mixed lymphoid population with scattered large cells with bi-/multi-nucleation and prominent nucleoli. Comments: Sections of lymph nodes reveal effacement of the architecture by a nodular lymphoreticular proliferation. There are abundant large cells with bi-/multi-nucleation and prominent nucleoli, consistent with Hodgkin Reed-Sternberg (HRS) cells. These occur both scattered and in large loose  clusters. There are lacunar variants and mummified cells. Focally, there are broad bands of fibrosis creating a more nodular appearance. In these areas the background reveals small lymphocytes and plasma cells with scattered eosinophils and neutrophils, while less fibrotic areas have a more lymphocytic background. The smaller node is only partially involved with areas of residual reactive lymph node. Immunohistochemistry reveals the HRS cells are positive for CD30 and CD15. They are negative for CD20, CD79a, and CD45. CD20 and CD79 highlight residual B-cell areas. CD3 highlights abundant T-cells. The overall findings are consistent with classical Hodgkin lymphoma, nodular sclerosis type. The case was called to Drs. Jenkins and Edgeley on 01/15/2015. Vicente Males MD Pathologist, Electronic Signature (Case signed 01/15/2015) Specimen Gross and Clinical Information   RADIOGRAPHIC STUDIES:   CLINICAL DATA: Left groin mass for 18 months.  EXAM: CT PELVIS WITH CONTRAST  TECHNIQUE: Multidetector CT imaging of the pelvis was performed using the standard protocol following the bolus administration of intravenous contrast.  CONTRAST: 143mL OMNIPAQUE IOHEXOL 300 MG/ML SOLN  COMPARISON: Ultrasound dated 12/18/2014  FINDINGS: There are multiple enlarged left inguinal lymph nodes. The largest node measures 41 x 22 mm on the coronal image. A slightly more inferior and medial node measures 23 x 16 mm and an external iliac node that measures 17 x 11 mm on the sagittal images.  There are multiple small nodes in the right inguinal region. No other discrete adenopathy is visible in the pelvis.  The patient has numerous lytic lesions in the iliac bones bilaterally some involving the cortex. The largest lesion is in the left ilium measuring 22 mm on the coronal image. There are 2 lytic lesions in the sacrum. Proximal femurs appear normal.  IMPRESSION: 1. Adenopathy in the left groin  and involving the left external iliac chain. The adenopathy is nonspecific. 2. Multiple lytic lesions in the iliac bones and sacrum. This appearance is most consistent with multiple myeloma.   Electronically Signed  By: Lorriane Shire M.D.  On: 12/20/2014 22:50   I have personally reviewed the radiological images as listed and agreed with the findings in the report.  ASSESSMENT & PLAN:  Hodgkin  Lymphoma, Nodular sclerosing type, Stage III-2A (? involvement in lingula) Abnormal CT of the pelvis with lytic bone disease, not PET avid Polyclonal gammopathy Elevated ESR IPS Score: 5 year freedom from progression and OS of 2; 80% FFP and 91% OS Eye EXAM on 01/29/2015 with possible lymphoma lesion in retina OD (Dr. Iona Hansen) PET/CT after 2 cycles Deauville 1  Plan is for continuation of ABVD in accordance with NCCN guidelines. He will complete a total of 6 cycles with repeat imaging post. Again neutropenic precautions and teaching will be done today  He has done remarkably well. I have called him in Elavil which will not only help his sleep which should improve his appetite.   We discussed future diagnostic plans, including a possible MRI study of the orbits of the eye, as well as looking into the abnormality in his pelvic bone. Paget's disease ? suspected.  All questions were answered. The patient knows to call the clinic with any problems, questions or concerns.   This note was electronically signed.    This document serves as a record of services personally performed by Ancil Linsey, MD. It was created on her behalf by Toni Amend, a trained medical scribe. The creation of this record is based on the scribe's personal observations and the provider's statements to them. This document has been checked and approved by the attending provider.  I have reviewed the above documentation for accuracy and completeness, and I agree with the above.  Kelby Fam. Whitney Muse, MD

## 2015-04-15 NOTE — Progress Notes (Signed)
Patient tolerated infusion well.  VSS.   

## 2015-04-28 ENCOUNTER — Ambulatory Visit (HOSPITAL_COMMUNITY): Payer: Self-pay | Admitting: Oncology

## 2015-04-29 ENCOUNTER — Ambulatory Visit (HOSPITAL_COMMUNITY): Payer: Self-pay

## 2015-04-29 ENCOUNTER — Encounter (HOSPITAL_COMMUNITY): Payer: Medicaid Other | Attending: Oncology

## 2015-04-29 ENCOUNTER — Encounter (HOSPITAL_BASED_OUTPATIENT_CLINIC_OR_DEPARTMENT_OTHER): Payer: Medicaid Other | Admitting: Oncology

## 2015-04-29 ENCOUNTER — Encounter (HOSPITAL_COMMUNITY): Payer: Self-pay | Admitting: Oncology

## 2015-04-29 ENCOUNTER — Ambulatory Visit (HOSPITAL_COMMUNITY): Payer: Self-pay | Admitting: Oncology

## 2015-04-29 VITALS — BP 107/64 | HR 62 | Temp 98.2°F | Resp 16 | Wt 171.3 lb

## 2015-04-29 DIAGNOSIS — C8115 Nodular sclerosis classical Hodgkin lymphoma, lymph nodes of inguinal region and lower limb: Secondary | ICD-10-CM

## 2015-04-29 DIAGNOSIS — C8190 Hodgkin lymphoma, unspecified, unspecified site: Secondary | ICD-10-CM

## 2015-04-29 DIAGNOSIS — Z5111 Encounter for antineoplastic chemotherapy: Secondary | ICD-10-CM | POA: Diagnosis not present

## 2015-04-29 LAB — CBC WITH DIFFERENTIAL/PLATELET
BASOS PCT: 1 % (ref 0–1)
Basophils Absolute: 0 10*3/uL (ref 0.0–0.1)
EOS ABS: 0.2 10*3/uL (ref 0.0–0.7)
Eosinophils Relative: 5 % (ref 0–5)
HEMATOCRIT: 33.2 % — AB (ref 39.0–52.0)
Hemoglobin: 10.9 g/dL — ABNORMAL LOW (ref 13.0–17.0)
Lymphocytes Relative: 33 % (ref 12–46)
Lymphs Abs: 1.1 10*3/uL (ref 0.7–4.0)
MCH: 28.8 pg (ref 26.0–34.0)
MCHC: 32.8 g/dL (ref 30.0–36.0)
MCV: 87.6 fL (ref 78.0–100.0)
MONO ABS: 0.5 10*3/uL (ref 0.1–1.0)
MONOS PCT: 16 % — AB (ref 3–12)
Neutro Abs: 1.6 10*3/uL — ABNORMAL LOW (ref 1.7–7.7)
Neutrophils Relative %: 45 % (ref 43–77)
Platelets: 215 10*3/uL (ref 150–400)
RBC: 3.79 MIL/uL — ABNORMAL LOW (ref 4.22–5.81)
RDW: 16.7 % — AB (ref 11.5–15.5)
WBC: 3.4 10*3/uL — ABNORMAL LOW (ref 4.0–10.5)

## 2015-04-29 LAB — COMPREHENSIVE METABOLIC PANEL
ALBUMIN: 3.5 g/dL (ref 3.5–5.0)
ALT: 17 U/L (ref 17–63)
AST: 21 U/L (ref 15–41)
Alkaline Phosphatase: 38 U/L (ref 38–126)
BILIRUBIN TOTAL: 0.4 mg/dL (ref 0.3–1.2)
BUN: 11 mg/dL (ref 6–20)
CO2: 29 mmol/L (ref 22–32)
Calcium: 8.6 mg/dL — ABNORMAL LOW (ref 8.9–10.3)
Chloride: 109 mmol/L (ref 101–111)
Creatinine, Ser: 1.05 mg/dL (ref 0.61–1.24)
GFR calc Af Amer: >60 mL/min (ref >60)
GFR calc non Af Amer: >60 mL/min (ref >60)
GLUCOSE: 99 mg/dL (ref 65–99)
POTASSIUM: 4.2 mmol/L (ref 3.5–5.1)
SODIUM: 140 mmol/L (ref 135–145)
TOTAL PROTEIN: 7.4 g/dL (ref 6.5–8.1)

## 2015-04-29 MED ORDER — ONDANSETRON HCL 8 MG PO TABS: 8.0000 mg | ORAL_TABLET | Freq: Three times a day (TID) | ORAL | Status: DC | PRN

## 2015-04-29 MED ORDER — HEPARIN SOD (PORK) LOCK FLUSH 100 UNIT/ML IV SOLN
500.0000 [IU] | Freq: Once | INTRAVENOUS | Status: AC | PRN
  Administered 2015-04-29: 500 [IU]
  Filled 2015-04-29: qty 5

## 2015-04-29 MED ORDER — SODIUM CHLORIDE 0.9 % IV SOLN
Freq: Once | INTRAVENOUS | Status: AC
  Administered 2015-04-29: 10:00:00 via INTRAVENOUS

## 2015-04-29 MED ORDER — VINBLASTINE SULFATE CHEMO INJECTION 1 MG/ML
6.0000 mg/m2 | Freq: Once | INTRAVENOUS | Status: AC
  Administered 2015-04-29: 11.5 mg via INTRAVENOUS
  Filled 2015-04-29: qty 11.5

## 2015-04-29 MED ORDER — SODIUM CHLORIDE 0.9 % IV SOLN
10.0000 [IU]/m2 | Freq: Once | INTRAVENOUS | Status: AC
  Administered 2015-04-29: 19 [IU] via INTRAVENOUS
  Filled 2015-04-29: qty 6.33

## 2015-04-29 MED ORDER — FOSAPREPITANT DIMEGLUMINE INJECTION 150 MG
Freq: Once | INTRAVENOUS | Status: AC
  Administered 2015-04-29: 11:00:00 via INTRAVENOUS
  Filled 2015-04-29: qty 5

## 2015-04-29 MED ORDER — DOXORUBICIN HCL CHEMO IV INJECTION 2 MG/ML
25.0000 mg/m2 | Freq: Once | INTRAVENOUS | Status: AC
Start: 2015-04-29 — End: 2015-04-29
  Administered 2015-04-29: 48 mg via INTRAVENOUS
  Filled 2015-04-29: qty 24

## 2015-04-29 MED ORDER — SODIUM CHLORIDE 0.9 % IJ SOLN: 10.0000 mL | INTRAMUSCULAR | Status: DC | PRN

## 2015-04-29 MED ORDER — DACARBAZINE 200 MG IV SOLR
375.0000 mg/m2 | Freq: Once | INTRAVENOUS | Status: AC
  Administered 2015-04-29: 720 mg via INTRAVENOUS
  Filled 2015-04-29: qty 36

## 2015-04-29 MED ORDER — OXYCODONE-ACETAMINOPHEN 5-325 MG PO TABS: 1.0000 | ORAL_TABLET | Freq: Four times a day (QID) | ORAL | Status: DC | PRN

## 2015-04-29 MED ORDER — PALONOSETRON HCL INJECTION 0.25 MG/5ML
INTRAVENOUS | Status: AC
  Filled 2015-04-29: qty 5

## 2015-04-29 MED ORDER — PALONOSETRON HCL INJECTION 0.25 MG/5ML
0.2500 mg | Freq: Once | INTRAVENOUS | Status: AC
  Administered 2015-04-29: 0.25 mg via INTRAVENOUS

## 2015-04-29 NOTE — Assessment & Plan Note (Addendum)
Stage III Hodgkin's Lymphoma with questionable involvement of lingula on PET imaging. BMBX negative. PET with mild hypermetabolism with borderline enlarged bilateral axillar and mediastinal nodes. Bilateral inguinal nodes.  Restaging PET following 2 cycles of ABVD demonstrates a complete remission with Deauville 1 noted.  Oncology history updated.  He will continue with a total of 6 cycles of treatment.  Will repeat PET within 3 months of completing therapy per NCCN guidelines.  WBC is low.  Will treat today as planned.  neulasta added to treatment plan for tomorrow.  Repeat MUGA in 4 weeks.  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  If WBC is not robust, will give GSF, but will treat no matter WBC today per guidelines.  Refill on Zofran and Oxycodone provided today.  He notes that the Elavil has helped with sleeping.  He notes that he sleeps 7 hours + since starting this medication.  His GERD is well controlled.  His nausea is well controlled as well.  Return in 2 weeks for follow-up and treatment and 4 weeks for follow-up and treatment.

## 2015-04-29 NOTE — Patient Instructions (Signed)
[facility] at [facility] Discharge Instructions  RECOMMENDATIONS MADE BY THE CONSULTANT AND ANY TEST RESULTS WILL BE SENT TO YOUR REFERRING PHYSICIAN.  Exam completed by Kirby Crigler  Chemotherapy as planned today Neulasta injection tomorrow Refill on zofran and oxycodone. [place] at the end of September  Return in 2 weeks follow up with the doctor Return in 2 weeks for chemotherapy Please call the clinic if you have any questions or concerns  Thank you for choosing [place] at [facility] to provide your oncology and hematology care.  To afford each patient quality time with our provider, please arrive at least 15 minutes before your scheduled appointment time.    You need to re-schedule your appointment should you arrive 10 or more minutes late.  We strive to give you quality time with our providers, and arriving late affects you and other patients whose appointments are after yours.  Also, if you no show three or more times for appointments you may be dismissed from the clinic at the providers discretion.     Again, thank you for choosing [facility].  Our hope is that these requests will decrease the amount of time that you wait before being seen by our physicians.       _____________________________________________________________  Should you have questions after your visit to [facility], please contact our office at [phone] between the hours of 8:30 a.m. and 4:30 p.m.  Voicemails left after 4:30 p.m. will not be returned until the following business day.  For prescription refill requests, have your pharmacy contact our office.

## 2015-04-29 NOTE — Progress Notes (Signed)
Patient tolerated infusion well.  VSS post infusion.   

## 2015-04-29 NOTE — Progress Notes (Signed)
      No PCP Per Patient No address on file  Hodgkin lymphoma - Plan: NM Cardiac Muga Rest, ondansetron (ZOFRAN) 8 MG tablet, oxyCODONE-acetaminophen (PERCOCET/ROXICET) 5-325 MG per tablet  CURRENT THERAPY: S/P cycle 3 of ABVD  INTERVAL HISTORY: Robert Acevedo 44 y.o. male returns for followup of Stage III Hodgkin's Lymphoma with questionable involvement of lingula on PET imaging. BMBX negative. PET with mild hypermetabolism with borderline enlarged bilateral axillar and mediastinal nodes. Bilateral inguinal nodes.  Restaging PET following 2 cycles of ABVD demonstrates a complete remission with Deauville 1 noted.    Hodgkin lymphoma   01/13/2015 Initial Biopsy L inguinal LN biopsy on 01/13/2015 with Classical Hodgkin Lymphoma, nodular sclerosing type   01/20/2015 Bone Marrow Biopsy normal BMBX, mild polyclonal plasmacytosis   01/28/2015 Imaging PET/CT Mild hyper metabolism is associated with the borderline enlarged bilateral axillary and mediastinal lymph nodes. There is more intense FDG uptake associated with bilateral inguinal lymph nodes. focal area of peripheral consolidation in the lingula.   01/29/2015 Imaging MUGA with EF 69%   01/29/2015 Imaging 1.spirometry is normal .no change with bronchodilator. lung volumes are normal. airway resistance somewhat high. DLCO normal decreased MIP/MEP may indicate weakness of the bellows function of the lung.    02/04/2015 -  Chemotherapy ABVD   03/28/2015 PET scan No adenopathy or abnormal hypermetabolic activity along the previous axillary, mediastinal, and inguinal lymph node chains. Deauville 1.   03/31/2015 Imaging MUGA- Normal LEFT ventricular ejection fraction of 70%, not significantly changed from the 69% calculated on the previous study of 01/29/2015.    I personally reviewed and went over laboratory results with the patient.  The results are noted within this dictation.  Pre-chemo labs are pending at the time of this dictation.    He notes  improvement in his sleeping pattern with the addition of Elavil to his medication list.  He notes sleep lasting 7 hours or so, compared to 3-5 hours.  He is pleased with the results of this medication.  He notes minor nausea that is well controlled with Zofran and PPI.  He was recently approved for Medicaid.  This insurance information has been updated in [facility].  He requests a refill on Zofran and Oxycodone.  Past Medical History  Diagnosis Date  . Bronchitis   . Lymphadenopathy, inguinal     left  . Hodgkin's lymphoma     has Lytic bone lesion of hip; Abnormal presence of protein in urine; and Hodgkin lymphoma on his problem list.     is allergic to clindamycin/lincomycin.  Current Outpatient Prescriptions on File Prior to Visit  Medication Sig Dispense Refill  . amitriptyline (ELAVIL) 25 MG tablet Take one tablet at bedtime for 5 days then increase to two tablets thereafter 60 tablet 3  . bleomycin (BLEOCIN) 15 UNITS injection Inject into the vein once. To be administered Day 1 & Day 15 every 28 days. To begin 02/04/15.    . citalopram (CELEXA) 20 MG tablet Take 1 tablet daily x 7 days. Then increase to 2 tablets daily. 49 tablet 0  . dacarbazine (DTIC) 200 MG chemo injection Inject into the vein once. To be administered Day 1 & Day 15 every 28 days. To begin 02/04/15.    . DOXOrubicin HCl (ADRIAMYCIN IV) Inject into the vein. To be administered Day 1 & Day 15 every 28 days. To begin 02/04/15.    . lidocaine-prilocaine (EMLA) cream Apply a quarter size amount to port site 1 hour prior   to chemo. Do not rub in. Cover with plastic wrap. 30 g 3  . LORazepam (ATIVAN) 0.5 MG tablet Take 1 tablet (0.5 mg total) by mouth every 8 (eight) hours as needed for anxiety. 30 tablet 1  . omeprazole (PRILOSEC) 40 MG capsule Take 1 capsule (40 mg total) by mouth daily. 30 capsule 3  . vinBLAStine (VELBAN) 10 MG injection Inject into the vein. To be administered Day 1 & Day 15 every 28 days. To begin 02/04/15.      Marland Kitchen doxycycline (VIBRA-TABS) 100 MG tablet Take 1 tablet (100 mg total) by mouth 2 (two) times daily. (Patient not taking: Reported on 03/31/2015) 20 tablet 0  . prochlorperazine (COMPAZINE) 10 MG tablet Take 1 tablet (10 mg total) by mouth every 6 (six) hours as needed for nausea or vomiting. (Patient not taking: Reported on 04/29/2015) 60 tablet 1   No current facility-administered medications on file prior to visit.    Past Surgical History  Procedure Laterality Date  . Wrist surgery      right  . Knee surgery    . Incision and drainage peritonsillar abscess    . Lymph node biopsy Left 01/13/2015    Procedure: LEFT INGUINAL LYMPH NODE BIOPSY;  Surgeon: Aviva Signs Md, MD;  Location: [facility];  Service: General;  Laterality: Left;  . Bone marrow biopsy Left 01/20/15  . Bone marrow aspiration Left 01/20/15  . Portacath placement Left 01/31/2015    Procedure: INSERTION PORT-A-CATH;  Surgeon: Aviva Signs Md, MD;  Location: [facility];  Service: General;  Laterality: Left;    Denies any headaches, dizziness, double vision, fevers, chills, night sweats, nausea, vomiting, diarrhea, constipation, chest pain, heart palpitations, shortness of breath, blood in stool, black tarry stool, urinary pain, urinary burning, urinary frequency, hematuria.   PHYSICAL EXAMINATION  ECOG PERFORMANCE STATUS: 1 - Symptomatic but completely ambulatory  There were no vitals filed for this visit.  GENERAL:alert, no distress, well nourished, well developed, comfortable, cooperative, smiling and unaccompanied today in chemotherapy bed. SKIN: skin color, texture, turgor are normal, no rashes or significant lesions with some darkening of skin from chemotherapy, particularly on hands and fingers bilaterally. HEAD: Normocephalic, No masses, lesions, tenderness or abnormalities EYES: normal, PERRLA, EOMI, Conjunctiva are pink and non-injected EARS: External ears normal OROPHARYNX:lips, buccal mucosa, and tongue normal and mucous  membranes are moist  NECK: supple, no adenopathy, thyroid normal size, non-tender, without nodularity, no stridor, non-tender, trachea midline LYMPH:  no palpable lymphadenopathy BREAST:not examined LUNGS: clear to auscultation HEART: regular rate & rhythm, no murmurs and no gallops ABDOMEN:abdomen soft, normal bowel sounds. BACK: Back symmetric, no curvature. EXTREMITIES:less then 2 second capillary refill, no joint deformities, effusion, or inflammation, no edema, no skin discoloration, no clubbing, no cyanosis  NEURO: alert & oriented x 3 with fluent speech, no focal motor/sensory deficits, gait normal   LABORATORY DATA: CBC    Component Value Date/Time   WBC 3.4* 04/29/2015 0930   RBC 3.79* 04/29/2015 0930   HGB 10.9* 04/29/2015 0930   HCT 33.2* 04/29/2015 0930   PLT 215 04/29/2015 0930   MCV 87.6 04/29/2015 0930   MCH 28.8 04/29/2015 0930   MCHC 32.8 04/29/2015 0930   RDW 16.7* 04/29/2015 0930   LYMPHSABS 1.1 04/29/2015 0930   MONOABS 0.5 04/29/2015 0930   EOSABS 0.2 04/29/2015 0930   BASOSABS 0.0 04/29/2015 0930      Chemistry      Component Value Date/Time   NA 140 04/15/2015 0859   K  3.7 04/15/2015 0859   CL 104 04/15/2015 0859   CO2 31 04/15/2015 0859   BUN 10 04/15/2015 0859   CREATININE 1.05 04/15/2015 0859      Component Value Date/Time   CALCIUM 8.7* 04/15/2015 0859   ALKPHOS 44 04/15/2015 0859   AST 20 04/15/2015 0859   ALT 18 04/15/2015 0859   BILITOT 0.4 04/15/2015 0859        PENDING LABS:   RADIOGRAPHIC STUDIES:  Nm Cardiac Muga Rest  03/31/2015   CLINICAL DATA:  Hodgkin's lymphoma, cardiotoxic chemotherapy  EXAM: NUCLEAR MEDICINE CARDIAC BLOOD POOL IMAGING (MUGA)  TECHNIQUE: Cardiac multi-gated acquisition was performed at rest following intravenous injection of Tc-32mlabeled red blood cells.  RADIOPHARMACEUTICALS:  26.8 mCi Tc-956mertechnetate in-vitro labeled autologous red blood cells IV  COMPARISON:  01/29/2015  FINDINGS: LEFT  ventricular ejection fraction is calculated at 70%, not significantly changed from the 69% calculated on the previous exam.  LEFT ventricular wall motion appears normal.  Patient was rhythmic during acquisition.  IMPRESSION: Normal LEFT ventricular ejection fraction of 70%, not significantly changed from the 69% calculated on the previous study of 01/29/2015.  Normal LV wall motion.   Electronically Signed   By: MaLavonia Dana.D.   On: 03/31/2015 12:50     PATHOLOGY:    ASSESSMENT AND PLAN:  Hodgkin lymphoma Stage III Hodgkin's Lymphoma with questionable involvement of lingula on PET imaging. BMBX negative. PET with mild hypermetabolism with borderline enlarged bilateral axillar and mediastinal nodes. Bilateral inguinal nodes.  Restaging PET following 2 cycles of ABVD demonstrates a complete remission with Deauville 1 noted.  Oncology history updated.  He will continue with a total of 6 cycles of treatment.  Will repeat PET within 3 months of completing therapy per NCCN guidelines.  WBC is low.  Will treat today as planned.  neulasta added to treatment plan for tomorrow.  Repeat MUGA in 4 weeks.  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  If WBC is not robust, will give GSF, but will treat no matter WBC today per guidelines.  Refill on Zofran and Oxycodone provided today.  He notes that the Elavil has helped with sleeping.  He notes that he sleeps 7 hours + since starting this medication.  His GERD is well controlled.  His nausea is well controlled as well.  Return in 2 weeks for follow-up and treatment and 4 weeks for follow-up and treatment.    THERAPY PLAN:  Continue with treatment as planned per NCCN guidelines.    All questions were answered. The patient knows to call the clinic with any problems, questions or concerns. We can certainly see the patient much sooner if necessary.  Patient and  plan discussed with Dr. ShAncil Linseynd she is in agreement with the aforementioned.   This note is electronically signed by: KERobynn PanePA-C 04/29/2015 10:14 AM

## 2015-04-30 ENCOUNTER — Encounter (HOSPITAL_BASED_OUTPATIENT_CLINIC_OR_DEPARTMENT_OTHER): Payer: Medicaid Other

## 2015-04-30 VITALS — BP 110/68 | HR 60 | Temp 98.0°F | Resp 16

## 2015-04-30 DIAGNOSIS — Z5189 Encounter for other specified aftercare: Secondary | ICD-10-CM

## 2015-04-30 DIAGNOSIS — C8190 Hodgkin lymphoma, unspecified, unspecified site: Secondary | ICD-10-CM

## 2015-04-30 DIAGNOSIS — C8115 Nodular sclerosis classical Hodgkin lymphoma, lymph nodes of inguinal region and lower limb: Secondary | ICD-10-CM

## 2015-04-30 DIAGNOSIS — R066 Hiccough: Secondary | ICD-10-CM

## 2015-04-30 MED ORDER — CHLORPROMAZINE HCL 25 MG PO TABS
25.0000 mg | ORAL_TABLET | Freq: Three times a day (TID) | ORAL | Status: DC
Start: 2015-04-30 — End: 2015-05-14

## 2015-04-30 MED ORDER — PEGFILGRASTIM INJECTION 6 MG/0.6ML ~~LOC~~
PREFILLED_SYRINGE | SUBCUTANEOUS | Status: AC
  Filled 2015-04-30: qty 0.6

## 2015-04-30 MED ORDER — PEGFILGRASTIM INJECTION 6 MG/0.6ML ~~LOC~~
6.0000 mg | PREFILLED_SYRINGE | Freq: Once | SUBCUTANEOUS | Status: AC
  Administered 2015-04-30: 6 mg via SUBCUTANEOUS

## 2015-04-30 NOTE — Progress Notes (Signed)
Robert Acevedo presents today for injection per MD orders. Neulasta 6mg  administered SQ in right Abdomen. Administration without incident. Patient tolerated well.  Hiccups reported to PA, prescription obtained and given to the patient to fill at his pharmacy.

## 2015-05-13 ENCOUNTER — Encounter (HOSPITAL_COMMUNITY): Payer: Medicaid Other | Attending: Hematology & Oncology

## 2015-05-13 ENCOUNTER — Encounter: Payer: Self-pay | Admitting: *Deleted

## 2015-05-13 ENCOUNTER — Encounter (HOSPITAL_BASED_OUTPATIENT_CLINIC_OR_DEPARTMENT_OTHER): Payer: Medicaid Other | Admitting: Hematology & Oncology

## 2015-05-13 ENCOUNTER — Encounter (HOSPITAL_COMMUNITY): Payer: Self-pay | Admitting: Hematology & Oncology

## 2015-05-13 VITALS — BP 111/67 | HR 64 | Temp 98.0°F | Resp 18

## 2015-05-13 VITALS — BP 156/91 | HR 81 | Temp 98.1°F | Resp 18 | Wt 172.0 lb

## 2015-05-13 DIAGNOSIS — C8190 Hodgkin lymphoma, unspecified, unspecified site: Secondary | ICD-10-CM | POA: Diagnosis not present

## 2015-05-13 DIAGNOSIS — Z5111 Encounter for antineoplastic chemotherapy: Secondary | ICD-10-CM

## 2015-05-13 DIAGNOSIS — Z720 Tobacco use: Secondary | ICD-10-CM | POA: Diagnosis not present

## 2015-05-13 DIAGNOSIS — E8809 Other disorders of plasma-protein metabolism, not elsewhere classified: Secondary | ICD-10-CM | POA: Diagnosis present

## 2015-05-13 DIAGNOSIS — C8115 Nodular sclerosis classical Hodgkin lymphoma, lymph nodes of inguinal region and lower limb: Secondary | ICD-10-CM | POA: Diagnosis not present

## 2015-05-13 LAB — COMPREHENSIVE METABOLIC PANEL
ALK PHOS: 53 U/L (ref 38–126)
ALT: 14 U/L — AB (ref 17–63)
AST: 21 U/L (ref 15–41)
Albumin: 3.7 g/dL (ref 3.5–5.0)
Anion gap: 7 (ref 5–15)
BUN: 12 mg/dL (ref 6–20)
CALCIUM: 8.8 mg/dL — AB (ref 8.9–10.3)
CHLORIDE: 101 mmol/L (ref 101–111)
CO2: 29 mmol/L (ref 22–32)
CREATININE: 1.1 mg/dL (ref 0.61–1.24)
Glucose, Bld: 163 mg/dL — ABNORMAL HIGH (ref 65–99)
Potassium: 3.4 mmol/L — ABNORMAL LOW (ref 3.5–5.1)
Sodium: 137 mmol/L (ref 135–145)
Total Bilirubin: 0.5 mg/dL (ref 0.3–1.2)
Total Protein: 7.7 g/dL (ref 6.5–8.1)

## 2015-05-13 LAB — CBC WITH DIFFERENTIAL/PLATELET
BASOS ABS: 0 10*3/uL (ref 0.0–0.1)
Basophils Relative: 0 % (ref 0–1)
Eosinophils Absolute: 0.1 10*3/uL (ref 0.0–0.7)
Eosinophils Relative: 1 % (ref 0–5)
HCT: 36.2 % — ABNORMAL LOW (ref 39.0–52.0)
HEMOGLOBIN: 12 g/dL — AB (ref 13.0–17.0)
LYMPHS ABS: 1.4 10*3/uL (ref 0.7–4.0)
LYMPHS PCT: 17 % (ref 12–46)
MCH: 29.2 pg (ref 26.0–34.0)
MCHC: 33.1 g/dL (ref 30.0–36.0)
MCV: 88.1 fL (ref 78.0–100.0)
Monocytes Absolute: 0.9 10*3/uL (ref 0.1–1.0)
Monocytes Relative: 11 % (ref 3–12)
NEUTROS ABS: 5.9 10*3/uL (ref 1.7–7.7)
NEUTROS PCT: 71 % (ref 43–77)
Platelets: 173 10*3/uL (ref 150–400)
RBC: 4.11 MIL/uL — AB (ref 4.22–5.81)
RDW: 17.8 % — ABNORMAL HIGH (ref 11.5–15.5)
WBC: 8.4 10*3/uL (ref 4.0–10.5)

## 2015-05-13 MED ORDER — DACARBAZINE 200 MG IV SOLR
375.0000 mg/m2 | Freq: Once | INTRAVENOUS | Status: AC
  Administered 2015-05-13: 720 mg via INTRAVENOUS
  Filled 2015-05-13: qty 36

## 2015-05-13 MED ORDER — SODIUM CHLORIDE 0.9 % IV SOLN
Freq: Once | INTRAVENOUS | Status: AC
  Administered 2015-05-13: 11:00:00 via INTRAVENOUS

## 2015-05-13 MED ORDER — SODIUM CHLORIDE 0.9 % IV SOLN
Freq: Once | INTRAVENOUS | Status: AC
  Administered 2015-05-13: 11:00:00 via INTRAVENOUS
  Filled 2015-05-13: qty 5

## 2015-05-13 MED ORDER — DOXORUBICIN HCL CHEMO IV INJECTION 2 MG/ML
25.0000 mg/m2 | Freq: Once | INTRAVENOUS | Status: AC
  Administered 2015-05-13: 48 mg via INTRAVENOUS
  Filled 2015-05-13: qty 24

## 2015-05-13 MED ORDER — SODIUM CHLORIDE 0.9 % IJ SOLN: 10.0000 mL | INTRAMUSCULAR | Status: DC | PRN

## 2015-05-13 MED ORDER — HEPARIN SOD (PORK) LOCK FLUSH 100 UNIT/ML IV SOLN
500.0000 [IU] | Freq: Once | INTRAVENOUS | Status: AC | PRN
  Administered 2015-05-13: 500 [IU]

## 2015-05-13 MED ORDER — PENICILLIN V POTASSIUM 500 MG PO TABS: ORAL_TABLET | ORAL | Status: DC

## 2015-05-13 MED ORDER — PALONOSETRON HCL INJECTION 0.25 MG/5ML
0.2500 mg | Freq: Once | INTRAVENOUS | Status: AC
  Administered 2015-05-13: 0.25 mg via INTRAVENOUS
  Filled 2015-05-13: qty 5

## 2015-05-13 MED ORDER — HEPARIN SOD (PORK) LOCK FLUSH 100 UNIT/ML IV SOLN
INTRAVENOUS | Status: AC
  Filled 2015-05-13: qty 5

## 2015-05-13 MED ORDER — VINBLASTINE SULFATE CHEMO INJECTION 1 MG/ML
6.0000 mg/m2 | Freq: Once | INTRAVENOUS | Status: AC
  Administered 2015-05-13: 11.5 mg via INTRAVENOUS
  Filled 2015-05-13: qty 11.5

## 2015-05-13 MED ORDER — SODIUM CHLORIDE 0.9 % IV SOLN
10.0000 [IU]/m2 | Freq: Once | INTRAVENOUS | Status: AC
  Administered 2015-05-13: 19 [IU] via INTRAVENOUS
  Filled 2015-05-13: qty 6.33

## 2015-05-13 NOTE — Patient Instructions (Signed)
..  [place] at [facility] Discharge Instructions  RECOMMENDATIONS MADE BY THE CONSULTANT AND ANY TEST RESULTS WILL BE SENT TO YOUR REFERRING PHYSICIAN. Return in 2 weeks  Call us if you need [place] before that time   Thank you for choosing [place] at [facility] to provide your oncology and hematology care.  To afford each patient quality time with our provider, please arrive at least 15 minutes before your scheduled appointment time.    You need to re-schedule your appointment should you arrive 10 or more minutes late.  We strive to give you quality time with our providers, and arriving late affects you and other patients whose appointments are after yours.  Also, if you no show three or more times for appointments you may be dismissed from the clinic at the providers discretion.     Again, thank you for choosing [facility].  Our hope is that these requests will decrease the amount of time that you wait before being seen by our physicians.       _____________________________________________________________  Should you have questions after your visit to [facility], please contact our office at [phone] between the hours of 8:30 a.m. and 4:30 p.m.  Voicemails left after 4:30 p.m. will not be returned until the following business day.  For prescription refill requests, have your pharmacy contact our office.

## 2015-05-13 NOTE — Patient Instructions (Signed)
[facility] Discharge Instructions for Patients Receiving Chemotherapy  Today you received the following chemotherapy agents bleomycin, vinblastine, DTIC, and adriamycin Follow up as scheduled Please call the clinic if you have any questions or concerns  To help prevent nausea and vomiting after your treatment, we encourage you to take your nausea medication  If you develop nausea and vomiting, or diarrhea that is not controlled by your medication, call the clinic.  The clinic phone number is [phone]. Office hours are Monday-Friday 8:30am-5:00pm.  BELOW ARE SYMPTOMS THAT SHOULD BE REPORTED IMMEDIATELY:  *FEVER GREATER THAN 101.0 F  *CHILLS WITH OR WITHOUT FEVER  NAUSEA AND VOMITING THAT IS NOT CONTROLLED WITH YOUR NAUSEA MEDICATION  *UNUSUAL SHORTNESS OF BREATH  *UNUSUAL BRUISING OR BLEEDING  TENDERNESS IN MOUTH AND THROAT WITH OR WITHOUT PRESENCE OF ULCERS  *URINARY PROBLEMS  *BOWEL PROBLEMS  UNUSUAL RASH Items with * indicate a potential emergency and should be followed up as soon as possible. If you have an emergency after office hours please contact your primary care physician or go to the nearest emergency department.  Please call the clinic during office hours if you have any questions or concerns.   You may also contact the Patient Navigator at [phone] should you have any questions or need assistance in obtaining follow up care. _____________________________________________________________________ Have you asked about our STAR program?    STAR stands for Survivorship Training and Rehabilitation, and this is a nationally recognized cancer care program that focuses on survivorship and rehabilitation.  Cancer and cancer treatments may cause problems, such as, pain, making you feel tired and keeping you from doing the things that you need or want to do. Cancer rehabilitation can help. Our goal is to reduce these troubling effects and help you have  the best quality of life possible.  You may receive a survey from a nurse that asks questions about your current state of health.  Based on the survey results, all eligible patients will be referred to the [facility] program for an evaluation so we can better serve you! A frequently asked questions sheet is available upon request.

## 2015-05-13 NOTE — Progress Notes (Signed)
[facility] NOTE  Patient Care Team: No Pcp Per Patient as PCP - General (General Practice)  CHIEF COMPLAINTS/PURPOSE OF CONSULTATION:  Hodgkin lymphoma Staging form: Lymphoid Neoplasms, AJCC 6th Edition Clinical stage from 03/04/2015: Stage III - Unsigned Staging comments:  Questionable involvement of lingula on PET imaging. BMBX negative. PET with mild hyper metabolism with borderline enlarged bilateral axillar and mediastinal nodes. Bilateral inguinal nodes.   NOTE: patient has lytic lesion in the pelvis that are NOT PET avid. Not biopsied  L inquinal LN biopsy on 01/13/2015 with final pathology showing Classical Hodgkin Lymphoma, nodular sclerosing type Bone marrow biopsy 01/20/2015 without evidence of malignancy    Hodgkin lymphoma   01/13/2015 Initial Biopsy L inguinal LN biopsy on 01/13/2015 with Classical Hodgkin Lymphoma, nodular sclerosing type   01/20/2015 Bone Marrow Biopsy normal BMBX, mild polyclonal plasmacytosis   01/28/2015 Imaging PET/CT Mild hyper metabolism is associated with the borderline enlarged bilateral axillary and mediastinal lymph nodes. There is more intense FDG uptake associated with bilateral inguinal lymph nodes. focal area of peripheral consolidation in the lingula.   01/29/2015 Imaging MUGA with EF 69%   01/29/2015 Imaging 1.spirometry is normal .no change with bronchodilator. lung volumes are normal. airway resistance somewhat high. DLCO normal decreased MIP/MEP may indicate weakness of the bellows function of the lung.    02/04/2015 -  Chemotherapy ABVD   03/28/2015 PET scan No adenopathy or abnormal hypermetabolic activity along the previous axillary, mediastinal, and inguinal lymph node chains. Deauville 1.   03/31/2015 Imaging MUGA- Normal LEFT ventricular ejection fraction of 70%, not significantly changed from the 69% calculated on the previous study of 01/29/2015.     HISTORY OF PRESENTING ILLNESS:  Robert Acevedo  44 y.o. male is here for additional follow-up of Hodgkin Lymphoma.   He is present alone today and likes to be called Robert Acevedo He denies nausea, vomiting, diarrhea or constipation. He denies fever or chills.  The patient is here today for treatment.    He is having emotional issues with his relationship and family at home. Social issues principally revolve around his girlfriend. He has skipped his Celexa treatment on occasion.  He has experienced some swelling on the left side of his face from a tooth chipping. He notes that this has resolved.  He denies fever or chills. No nausea or vomiting. He continues to smoke.  MEDICAL HISTORY:  Past Medical History  Diagnosis Date  . Bronchitis   . Lymphadenopathy, inguinal     left  . Hodgkin's lymphoma     SURGICAL HISTORY: Past Surgical History  Procedure Laterality Date  . Wrist surgery      right  . Knee surgery    . Incision and drainage peritonsillar abscess    . Lymph node biopsy Left 01/13/2015    Procedure: LEFT INGUINAL LYMPH NODE BIOPSY;  Surgeon: Aviva Signs Md, MD;  Location: [facility];  Service: General;  Laterality: Left;  . Bone marrow biopsy Left 01/20/15  . Bone marrow aspiration Left 01/20/15  . Portacath placement Left 01/31/2015    Procedure: INSERTION PORT-A-CATH;  Surgeon: Aviva Signs Md, MD;  Location: [facility];  Service: General;  Laterality: Left;    SOCIAL HISTORY: Social History   Social History  . Marital Status: Single    Spouse Name: N/A  . Number of Children: N/A  . Years of Education: N/A   Occupational History  . Not on file.   Social History Main Topics  .  Smoking status: Current Every Day Smoker -- 0.50 packs/day for 2 years    Types: Cigarettes  . Smokeless tobacco: Never Used  . Alcohol Use: Yes     Comment: occasional  . Drug Use: Yes    Special: Marijuana     Comment: on occasion - last used 01/26/15  . Sexual Activity: Yes    Birth Control/ Protection: None   Other Topics Concern  . Not on  file   Social History Narrative  Pt is a smoker starting 2008, pack of cigarettes a week. Infrequent drinking alcohol, maybe one or two once a month. Denies drug use other than daily marijuana. In jail for 18 months.  Has 3 kids, 46 yo, 67 yo, and 62 yo. Has 1 grandchild. He has a car.    FAMILY HISTORY: Family History  Problem Relation Age of Onset  . Seizures Mother   . Diabetes Father   Father is 46 yo diabetic. Mother is 35 yo. Both still living. 1 brother, 4 sisters. One sister has breast cancer, diagnosed 1996 in her 66s. Another sister has severe depression. He has been with his girlfriend 9-10 years. He lives with his girlfriend and his father. He hasn't told his father about his diagnosis yet. One sister lives in [place] and she is aware of his diagnosis. He told two of his childhood friends about his diagnosis.   ALLERGIES:  is allergic to clindamycin/lincomycin.  MEDICATIONS:  Current Outpatient Prescriptions  Medication Sig Dispense Refill  . amitriptyline (ELAVIL) 25 MG tablet Take one tablet at bedtime for 5 days then increase to two tablets thereafter (Patient taking differently: Take 50 mg by mouth at bedtime. Take one tablet at bedtime for 5 days then increase to two tablets thereafter) 60 tablet 3  . bleomycin (BLEOCIN) 15 UNITS injection Inject into the vein once. To be administered Day 1 & Day 15 every 28 days. To begin 02/04/15.    . citalopram (CELEXA) 20 MG tablet Take 1 tablet daily x 7 days. Then increase to 2 tablets daily. 49 tablet 0  . dacarbazine (DTIC) 200 MG chemo injection Inject into the vein once. To be administered Day 1 & Day 15 every 28 days. To begin 02/04/15.    Marland Kitchen DOXOrubicin HCl (ADRIAMYCIN IV) Inject into the vein. To be administered Day 1 & Day 15 every 28 days. To begin 02/04/15.    . lidocaine-prilocaine (EMLA) cream Apply a quarter size amount to port site 1 hour prior to chemo. Do not rub in. Cover with plastic wrap. 30 g 3  . LORazepam (ATIVAN)  0.5 MG tablet Take 1 tablet (0.5 mg total) by mouth every 8 (eight) hours as needed for anxiety. 30 tablet 1  . omeprazole (PRILOSEC) 40 MG capsule Take 1 capsule (40 mg total) by mouth daily. 30 capsule 3  . ondansetron (ZOFRAN) 8 MG tablet Take 1 tablet (8 mg total) by mouth every 8 (eight) hours as needed for nausea or vomiting. 60 tablet 1  . oxyCODONE-acetaminophen (PERCOCET/ROXICET) 5-325 MG per tablet Take 1-2 tablets by mouth every 6 (six) hours as needed for severe pain. Take for Neulasta bone pain 45 tablet 0  . vinBLAStine (VELBAN) 10 MG injection Inject into the vein. To be administered Day 1 & Day 15 every 28 days. To begin 02/04/15.    . chlorproMAZINE (THORAZINE) 25 MG tablet Take 1 tablet (25 mg total) by mouth 3 (three) times daily. (Patient not taking: Reported on 05/13/2015) 15 tablet 0  .  doxycycline (VIBRA-TABS) 100 MG tablet Take 1 tablet (100 mg total) by mouth 2 (two) times daily. (Patient not taking: Reported on 03/31/2015) 20 tablet 0  . prochlorperazine (COMPAZINE) 10 MG tablet Take 1 tablet (10 mg total) by mouth every 6 (six) hours as needed for nausea or vomiting. (Patient not taking: Reported on 04/29/2015) 60 tablet 1   No current facility-administered medications for this visit.   Facility-Administered Medications Ordered in Other Visits  Medication Dose Route Frequency Provider Last Rate Last Dose  . 0.9 %  sodium chloride infusion   Intravenous Once Patrici Ranks, MD      . bleomycin (BLEOCIN) 19 Units in sodium chloride 0.9 % 50 mL chemo infusion  10 Units/m2 (Treatment Plan Actual) Intravenous Once Patrici Ranks, MD      . dacarbazine (DTIC) 720 mg in sodium chloride 0.9 % 250 mL chemo infusion  375 mg/m2 (Treatment Plan Actual) Intravenous Once Patrici Ranks, MD      . DOXOrubicin (ADRIAMYCIN) chemo injection 48 mg  25 mg/m2 (Treatment Plan Actual) Intravenous Once Patrici Ranks, MD      . fosaprepitant (EMEND) 150 mg, dexamethasone (DECADRON) 12 mg in  sodium chloride 0.9 % 145 mL IVPB   Intravenous Once Patrici Ranks, MD      . heparin lock flush 100 unit/mL  500 Units Intracatheter Once PRN Patrici Ranks, MD      . palonosetron (ALOXI) injection 0.25 mg  0.25 mg Intravenous Once Patrici Ranks, MD      . sodium chloride 0.9 % injection 10 mL  10 mL Intracatheter PRN Patrici Ranks, MD      . vinBLAStine (VELBAN) 11.5 mg in sodium chloride 0.9 % 50 mL chemo infusion  6 mg/m2 (Treatment Plan Actual) Intravenous Once Patrici Ranks, MD        Review of Systems  Constitutional: Negative for weight loss and diaphoresis. Negative for fever, chills and malaise/fatigue.  HENT: Negative for congestion, ear discharge, ear pain, hearing loss, nosebleeds, sore throat and tinnitus.   Eyes: Negative for blurred vision. Negative for double vision, photophobia, pain, discharge and redness.  Respiratory: Negative.  Negative for stridor.   Cardiovascular: Negative.   Gastrointestinal: Negative for heartburn, nausea, vomiting, diarrhea, blood in stool and melena. Genitourinary: Negative for dysuria, frequency, hematuria and flank pain.  Musculoskeletal: Negative.   Skin: Negative.   Neurological: Negative for dizziness, tingling, tremors, sensory change, speech change, focal weakness, seizures, loss of consciousness and weakness.  Endo/Heme/Allergies: Negative.   Psychiatric/Behavioral: Positive for substance abuse. Negative for depression, suicidal ideas, hallucinations and memory loss. The patient has insomnia. The patient is not nervous/anxious.    14 point ROS was done and is otherwise as detailed above or in HPI   PHYSICAL EXAMINATION: ECOG PERFORMANCE STATUS: 1 - Symptomatic but completely ambulatory  Filed Vitals:   05/13/15 0917  BP: 156/91  Pulse: 81  Temp: 98.1 F (36.7 C)  Resp: 18   Filed Weights   05/13/15 0917  Weight: 172 lb (78.019 kg)   Physical Exam  Constitutional: He is oriented to person, place, and time  and well-developed, well-nourished, and in no distress.  HENT:  Head: Normocephalic and atraumatic.  Nose: Nose normal.  Mouth/Throat: Oropharynx is clear and moist. No oropharyngeal exudate.  Eyes: Conjunctivae and EOM are normal. Pupils are equal, round, and reactive to light. Right eye exhibits no discharge. Left eye exhibits no discharge. No scleral icterus.  Neck: Normal range  of motion. Neck supple. No tracheal deviation present. No thyromegaly present.  Cardiovascular: Normal rate, regular rhythm and normal heart sounds.  Exam reveals no gallop and no friction rub.  No murmur heard. Pulmonary/Chest: Effort normal and breath sounds normal. He has no wheezes. He has no rales. Port a cath accessed L chest Abdominal: Soft. Bowel sounds are normal. He exhibits no distension and no mass. There is no tenderness. There is no rebound and no guarding.  Genitourinary:  deferred Musculoskeletal: Normal range of motion. He exhibits no edema.   Neurological: He is alert and oriented to person, place, and time. He has normal reflexes. No cranial nerve deficit. Gait normal. Coordination normal.  Skin: Skin is warm and dry. No rash noted.  Psychiatric: Mood, memory, affect and judgment normal.  Nursing note and vitals reviewed.   LABORATORY DATA:  I have reviewed the data as listed. Lab Results  Component Value Date   WBC 8.4 05/13/2015   HGB 12.0* 05/13/2015   HCT 36.2* 05/13/2015   MCV 88.1 05/13/2015   PLT 173 05/13/2015   CMP     Component Value Date/Time   NA 137 05/13/2015 0915   K 3.4* 05/13/2015 0915   CL 101 05/13/2015 0915   CO2 29 05/13/2015 0915   GLUCOSE 163* 05/13/2015 0915   BUN 12 05/13/2015 0915   CREATININE 1.10 05/13/2015 0915   CALCIUM 8.8* 05/13/2015 0915   PROT 7.7 05/13/2015 0915   ALBUMIN 3.7 05/13/2015 0915   AST 21 05/13/2015 0915   ALT 14* 05/13/2015 0915   ALKPHOS 53 05/13/2015 0915   BILITOT 0.5 05/13/2015 0915   GFRNONAA >60 05/13/2015 0915   GFRAA  >60 05/13/2015 0915     PATHOLOGY:  BMBXIAGNOSIS Diagnosis Bone Marrow, Aspirate,Biopsy, and Clot, left iliac - NORMOCELLULAR BONE MARROW FOR AGE WITH TRILINEAGE HEMATOPOIESIS. - MILD POLYCLONAL PLASMACYTOSIS - NO TUMOR IDENTIFIED. PERIPHERAL BLOOD: - NORMOCYTIC-NORMOCHROMIC ANEMIA. Diagnosis Note There is no morphologic evidence of involvement by Hodgkin lymphoma in this material. Clinical correlation is recommended. (BNS:ecj 01/22/2015) Susanne Greenhouse MD Pathologist, Electronic Signature  LYMPH NODE BIOPSY Diagnosis Lymph node for lymphoma, left inguinal - CLASSICAL HODGKIN LYMPHOMA, NODULAR SCLEROSIS TYPE. - SEE ONCOLOGY TABLE. Microscopic Comment LYMPHOMA Histologic type: Classical Hodgkin lymphoma, nodular sclerosis type. Grade (if applicable): N/A. Flow cytometry: Not performed. Immunohistochemical stains: CD45, CD20, CD79a, CD3, CD30, and CD15. Touch preps/imprints: Mixed lymphoid population with scattered large cells with bi-/multi-nucleation and prominent nucleoli. Comments: Sections of lymph nodes reveal effacement of the architecture by a nodular lymphoreticular proliferation. There are abundant large cells with bi-/multi-nucleation and prominent nucleoli, consistent with Hodgkin Reed-Sternberg (HRS) cells. These occur both scattered and in large loose clusters. There are lacunar variants and mummified cells. Focally, there are broad bands of fibrosis creating a more nodular appearance. In these areas the background reveals small lymphocytes and plasma cells with scattered eosinophils and neutrophils, while less fibrotic areas have a more lymphocytic background. The smaller node is only partially involved with areas of residual reactive lymph node. Immunohistochemistry reveals the HRS cells are positive for CD30 and CD15. They are negative for CD20, CD79a, and CD45. CD20 and CD79 highlight residual B-cell areas. CD3 highlights abundant T-cells. The overall findings are  consistent with classical Hodgkin lymphoma, nodular sclerosis type. The case was called to Drs. Jenkins and [facility] on 01/15/2015. Vicente Males MD Pathologist, Electronic Signature (Case signed 01/15/2015) Specimen Gross and Clinical Information   RADIOGRAPHIC STUDIES:   CLINICAL DATA: Left groin mass for 18 months.  EXAM: CT PELVIS WITH CONTRAST  TECHNIQUE: Multidetector CT imaging of the pelvis was performed using the standard protocol following the bolus administration of intravenous contrast.  CONTRAST: 110m OMNIPAQUE IOHEXOL 300 MG/ML SOLN  COMPARISON: Ultrasound dated 12/18/2014  FINDINGS: There are multiple enlarged left inguinal lymph nodes. The largest node measures 41 x 22 mm on the coronal image. A slightly more inferior and medial node measures 23 x 16 mm and an external iliac node that measures 17 x 11 mm on the sagittal images.  There are multiple small nodes in the right inguinal region. No other discrete adenopathy is visible in the pelvis.  The patient has numerous lytic lesions in the iliac bones bilaterally some involving the cortex. The largest lesion is in the left ilium measuring 22 mm on the coronal image. There are 2 lytic lesions in the sacrum. Proximal femurs appear normal.  IMPRESSION: 1. Adenopathy in the left groin and involving the left external iliac chain. The adenopathy is nonspecific. 2. Multiple lytic lesions in the iliac bones and sacrum. This appearance is most consistent with multiple myeloma.   Electronically Signed  By: JLorriane ShireM.D.  On: 12/20/2014 22:50   I have personally reviewed the radiological images as listed and agreed with the findings in the report.  ASSESSMENT & PLAN:  Hodgkin Lymphoma, Nodular sclerosing type, Stage III-2A (? involvement in lingula) Abnormal CT of the pelvis with lytic bone disease, not PET avid Polyclonal gammopathy Elevated ESR IPS Score: 5 year freedom from  progression and OS of 2; 80% FFP and 91% OS Eye EXAM on 01/29/2015 with possible lymphoma lesion in retina OD (Dr. HIona Hansen PET/CT after 2 cycles Deauville 1  Plan is for continuation of ABVD in accordance with NCCN guidelines. He will complete a total of 6 cycles with repeat imaging post. Again neutropenic precautions and teaching will be done today.  I discussed with the patient the importance off taking his antidepressant on a regular basis. I will have GLavella Hammockcontinue to discuss his social issues.   We discussed future diagnostic plans, including a possible MRI study of the orbits of the eye, as well as looking into the abnormality in his pelvic bone. Paget's disease ? Suspected. We will need additional follow-up with his eye doctor, Dr. HIona Hansen  I have called in penicillin for his oral complaints.  All questions were answered. The patient knows to call the clinic with any problems, questions or concerns.   This note was electronically signed.    This document serves as a record of services personally performed by SAncil Linsey MD. It was created on her behalf by DJanace Hoard a trained medical scribe. The creation of this record is based on the scribe's personal observations and the provider's statements to them. This document has been checked and approved by the attending provider.  I have reviewed the above documentation for accuracy and completeness, and I agree with the above.  SKelby Fam PWhitney Muse MD

## 2015-05-13 NOTE — Progress Notes (Signed)
[place] Clinical Social Work  Clinical Social Work was referred by [employer] for assessment of psychosocial needs due to ongoing issues with depression due to relationship issues. Clinical Social Worker met with patient at [facility] during infusion to offer support and assess for needs.  Pt laughing and on the phone when CSW entered room. Then pt hung up the phone and became increasingly tearful. Pt reports he has been forgetting to take his celexa.CSW encouraged pt to take regularly and pt states understanding. He reports he has been attending counseling in the community once a month. He feels this is helpful and returns again for counseling later this week. He reports he enjoys spending time outside and with his son. This lifts his spirits. He reports he "does pretty well until alone". CSW attempted to get pt to consider additional ways to find coping techniques. Pt will consider keeping a journal as a way to log his feelings. Pt denies practical or other concerns currently. CSW will continue to follow and check on as needed at future appointments.   Clinical Social Work interventions: Emotional support   Robert Acevedo, [place] Tuesdays   [phone]

## 2015-05-13 NOTE — Progress Notes (Signed)
Marquice K Jewkes Tolerated chemotherapy well today Discharged ambulatory

## 2015-05-14 ENCOUNTER — Other Ambulatory Visit (HOSPITAL_COMMUNITY): Payer: Self-pay | Admitting: *Deleted

## 2015-05-14 DIAGNOSIS — R066 Hiccough: Secondary | ICD-10-CM

## 2015-05-14 MED ORDER — CHLORPROMAZINE HCL 25 MG PO TABS: 25.0000 mg | ORAL_TABLET | Freq: Three times a day (TID) | ORAL | Status: DC

## 2015-05-27 ENCOUNTER — Encounter (HOSPITAL_BASED_OUTPATIENT_CLINIC_OR_DEPARTMENT_OTHER): Payer: Medicaid Other | Admitting: Oncology

## 2015-05-27 ENCOUNTER — Encounter (HOSPITAL_COMMUNITY): Payer: Self-pay | Admitting: Oncology

## 2015-05-27 ENCOUNTER — Encounter: Payer: Self-pay | Admitting: *Deleted

## 2015-05-27 ENCOUNTER — Encounter (HOSPITAL_BASED_OUTPATIENT_CLINIC_OR_DEPARTMENT_OTHER): Payer: Medicaid Other

## 2015-05-27 ENCOUNTER — Ambulatory Visit (HOSPITAL_COMMUNITY): Payer: Medicaid Other | Admitting: Hematology & Oncology

## 2015-05-27 VITALS — BP 106/57 | HR 79 | Temp 97.8°F | Resp 16 | Wt 174.0 lb

## 2015-05-27 VITALS — BP 108/72 | HR 72 | Temp 98.2°F | Resp 16

## 2015-05-27 DIAGNOSIS — C8115 Nodular sclerosis classical Hodgkin lymphoma, lymph nodes of inguinal region and lower limb: Secondary | ICD-10-CM | POA: Diagnosis not present

## 2015-05-27 DIAGNOSIS — C8190 Hodgkin lymphoma, unspecified, unspecified site: Secondary | ICD-10-CM

## 2015-05-27 DIAGNOSIS — E8809 Other disorders of plasma-protein metabolism, not elsewhere classified: Secondary | ICD-10-CM | POA: Diagnosis not present

## 2015-05-27 DIAGNOSIS — Z5111 Encounter for antineoplastic chemotherapy: Secondary | ICD-10-CM

## 2015-05-27 LAB — CBC WITH DIFFERENTIAL/PLATELET
BASOS ABS: 0 10*3/uL (ref 0.0–0.1)
BASOS PCT: 1 %
Eosinophils Absolute: 0.2 10*3/uL (ref 0.0–0.7)
Eosinophils Relative: 5 %
HEMATOCRIT: 34.7 % — AB (ref 39.0–52.0)
Hemoglobin: 11.2 g/dL — ABNORMAL LOW (ref 13.0–17.0)
LYMPHS PCT: 30 %
Lymphs Abs: 1.1 10*3/uL (ref 0.7–4.0)
MCH: 28.5 pg (ref 26.0–34.0)
MCHC: 32.3 g/dL (ref 30.0–36.0)
MCV: 88.3 fL (ref 78.0–100.0)
Monocytes Absolute: 0.6 10*3/uL (ref 0.1–1.0)
Monocytes Relative: 16 %
NEUTROS ABS: 1.7 10*3/uL (ref 1.7–7.7)
NEUTROS PCT: 48 %
Platelets: 251 10*3/uL (ref 150–400)
RBC: 3.93 MIL/uL — AB (ref 4.22–5.81)
RDW: 17.4 % — ABNORMAL HIGH (ref 11.5–15.5)
WBC: 3.6 10*3/uL — AB (ref 4.0–10.5)

## 2015-05-27 LAB — COMPREHENSIVE METABOLIC PANEL
ALBUMIN: 3.6 g/dL (ref 3.5–5.0)
ALT: 16 U/L — AB (ref 17–63)
AST: 18 U/L (ref 15–41)
Alkaline Phosphatase: 38 U/L (ref 38–126)
Anion gap: 4 — ABNORMAL LOW (ref 5–15)
BILIRUBIN TOTAL: 0.3 mg/dL (ref 0.3–1.2)
BUN: 12 mg/dL (ref 6–20)
CHLORIDE: 106 mmol/L (ref 101–111)
CO2: 30 mmol/L (ref 22–32)
CREATININE: 1 mg/dL (ref 0.61–1.24)
Calcium: 8.5 mg/dL — ABNORMAL LOW (ref 8.9–10.3)
GFR calc Af Amer: >60 mL/min (ref >60)
GLUCOSE: 105 mg/dL — AB (ref 65–99)
POTASSIUM: 4.1 mmol/L (ref 3.5–5.1)
Sodium: 140 mmol/L (ref 135–145)
TOTAL PROTEIN: 7.5 g/dL (ref 6.5–8.1)

## 2015-05-27 MED ORDER — OXYCODONE-ACETAMINOPHEN 5-325 MG PO TABS: 1.0000 | ORAL_TABLET | Freq: Four times a day (QID) | ORAL | Status: DC | PRN

## 2015-05-27 MED ORDER — HEPARIN SOD (PORK) LOCK FLUSH 100 UNIT/ML IV SOLN: 250.0000 [IU] | Freq: Once | INTRAVENOUS | Status: DC | PRN

## 2015-05-27 MED ORDER — DOXORUBICIN HCL CHEMO IV INJECTION 2 MG/ML
25.0000 mg/m2 | Freq: Once | INTRAVENOUS | Status: AC
  Administered 2015-05-27: 48 mg via INTRAVENOUS
  Filled 2015-05-27: qty 24

## 2015-05-27 MED ORDER — LIDOCAINE-PRILOCAINE 2.5-2.5 % EX CREA: TOPICAL_CREAM | CUTANEOUS | Status: DC

## 2015-05-27 MED ORDER — HEPARIN SOD (PORK) LOCK FLUSH 100 UNIT/ML IV SOLN
500.0000 [IU] | Freq: Once | INTRAVENOUS | Status: AC | PRN
  Administered 2015-05-27: 500 [IU]
  Filled 2015-05-27: qty 5

## 2015-05-27 MED ORDER — SODIUM CHLORIDE 0.9 % IV SOLN
375.0000 mg/m2 | Freq: Once | INTRAVENOUS | Status: AC
  Administered 2015-05-27: 720 mg via INTRAVENOUS
  Filled 2015-05-27: qty 36

## 2015-05-27 MED ORDER — PALONOSETRON HCL INJECTION 0.25 MG/5ML
0.2500 mg | Freq: Once | INTRAVENOUS | Status: AC
  Administered 2015-05-27: 0.25 mg via INTRAVENOUS
  Filled 2015-05-27: qty 5

## 2015-05-27 MED ORDER — SODIUM CHLORIDE 0.9 % IV SOLN
10.0000 [IU]/m2 | Freq: Once | INTRAVENOUS | Status: AC
  Administered 2015-05-27: 19 [IU] via INTRAVENOUS
  Filled 2015-05-27: qty 6.33

## 2015-05-27 MED ORDER — SODIUM CHLORIDE 0.9 % IJ SOLN: 10.0000 mL | INTRAMUSCULAR | Status: DC | PRN

## 2015-05-27 MED ORDER — SODIUM CHLORIDE 0.9 % IV SOLN
Freq: Once | INTRAVENOUS | Status: AC
  Administered 2015-05-27: 11:00:00 via INTRAVENOUS

## 2015-05-27 MED ORDER — SODIUM CHLORIDE 0.9 % IV SOLN
Freq: Once | INTRAVENOUS | Status: AC
  Administered 2015-05-27: 11:00:00 via INTRAVENOUS
  Filled 2015-05-27: qty 5

## 2015-05-27 MED ORDER — SODIUM CHLORIDE 0.9 % IV SOLN
6.0000 mg/m2 | Freq: Once | INTRAVENOUS | Status: AC
  Administered 2015-05-27: 11.5 mg via INTRAVENOUS
  Filled 2015-05-27: qty 11.5

## 2015-05-27 NOTE — Addendum Note (Signed)
Addended by: Baird Cancer on: 05/27/2015 04:01 PM   Modules accepted: Orders

## 2015-05-27 NOTE — Patient Instructions (Signed)
[place] at [facility] Discharge Instructions  RECOMMENDATIONS MADE BY THE CONSULTANT AND ANY TEST RESULTS WILL BE SENT TO YOUR REFERRING PHYSICIAN.  Exam and discussion by Robynn Pane, PA-C Will treat today and no neulasta tomorrow. Call with fevers, uncontrolled nausea, vomiting or other concerns.  Follow-up in 2 weeks.  Thank you for choosing [place] at [facility] to provide your oncology and hematology care.  To afford each patient quality time with our provider, please arrive at least 15 minutes before your scheduled appointment time.    You need to re-schedule your appointment should you arrive 10 or more minutes late.  We strive to give you quality time with our providers, and arriving late affects you and other patients whose appointments are after yours.  Also, if you no show three or more times for appointments you may be dismissed from the clinic at the providers discretion.     Again, thank you for choosing [facility].  Our hope is that these requests will decrease the amount of time that you wait before being seen by our physicians.       _____________________________________________________________  Should you have questions after your visit to [facility], please contact our office at [phone] between the hours of 8:30 a.m. and 4:30 p.m.  Voicemails left after 4:30 p.m. will not be returned until the following business day.  For prescription refill requests, have your pharmacy contact our office.

## 2015-05-27 NOTE — Progress Notes (Signed)
[place] Clinical Social Work  Clinical Social Work was referred by [place] rounding for assessment of psychosocial needs due to ongoing depression. Clinical Social Worker met patient at [facility] during treatment to offer support and assess for needs.  Pt continues to see counselor and last was there last week. Pt considering seeing counselor more often and CSW encouraged pt to consider this option. CSW provided supportive listening and solution focused discussion. Pt agrees to consider additional counseling through regular counselor. CSW encouraged pt to consider additional support through [facility] and CSW provided pt with October calendar. CSW to follow.   Clinical Social Work interventions: Resource education Supportive listening  [place], [place] Tuesdays   [phone]

## 2015-05-27 NOTE — Progress Notes (Signed)
Tolerated tx w/o adverse reaction; VSS.  Discharged ambulatory. 

## 2015-05-27 NOTE — Progress Notes (Signed)
No PCP Per Patient No address on file  Hodgkin lymphoma  CURRENT THERAPY: S/P cycle 4 of ABVD  INTERVAL HISTORY: Robert Acevedo [age] y.o. male returns for followup of Stage III Hodgkin's Lymphoma with questionable involvement of lingula on PET imaging. BMBX negative. PET with mild hypermetabolism with borderline enlarged bilateral axillary and mediastinal nodes. Bilateral inguinal nodes.  Restaging PET following 2 cycles of ABVD demonstrates a complete remission with Deauville 1 noted.    Hodgkin lymphoma   01/13/2015 Initial Biopsy L inguinal LN biopsy on 01/13/2015 with Classical Hodgkin Lymphoma, nodular sclerosing type   01/20/2015 Bone Marrow Biopsy normal BMBX, mild polyclonal plasmacytosis   01/28/2015 Imaging PET/CT Mild hyper metabolism is associated with the borderline enlarged bilateral axillary and mediastinal lymph nodes. There is more intense FDG uptake associated with bilateral inguinal lymph nodes. focal area of peripheral consolidation in the lingula.   01/29/2015 Imaging MUGA with EF 69%   01/29/2015 Imaging 1.spirometry is normal .no change with bronchodilator. lung volumes are normal. airway resistance somewhat high. DLCO normal decreased MIP/MEP may indicate weakness of the bellows function of the lung.    02/04/2015 -  Chemotherapy ABVD   03/28/2015 PET scan No adenopathy or abnormal hypermetabolic activity along the previous axillary, mediastinal, and inguinal lymph node chains. Deauville 1.   03/31/2015 Imaging MUGA- Normal LEFT ventricular ejection fraction of 70%, not significantly changed from the 69% calculated on the previous study of 01/29/2015.    I personally reviewed and went over laboratory results with the patient.  The results are noted within this dictation.   He continues to tolerate treatment.  He noted a decrease in taste of foods for 2 days following his last treatment.  His weight is up 2 lbs since his last treatment.  He is encouraged to continue  to eat, even during those times when foods do not taste well.  He may notice more symptoms that are typical as we make our way closer to the end of treatment.  He is followed by Abby Potash (CSW).   Past Medical History  Diagnosis Date  . Bronchitis   . Lymphadenopathy, inguinal     left  . Hodgkin's lymphoma     has Lytic bone lesion of hip; Abnormal presence of protein in urine; and Hodgkin lymphoma on his problem list.     is allergic to clindamycin/lincomycin.  Current Outpatient Prescriptions on File Prior to Visit  Medication Sig Dispense Refill  . amitriptyline (ELAVIL) 25 MG tablet Take one tablet at bedtime for 5 days then increase to two tablets thereafter (Patient taking differently: Take 50 mg by mouth at bedtime. Take one tablet at bedtime for 5 days then increase to two tablets thereafter) 60 tablet 3  . bleomycin (BLEOCIN) 15 UNITS injection Inject into the vein once. To be administered Day 1 & Day 15 every 28 days. To begin 02/04/15.    . chlorproMAZINE (THORAZINE) 25 MG tablet Take 1 tablet (25 mg total) by mouth 3 (three) times daily. 15 tablet 0  . citalopram (CELEXA) 20 MG tablet Take 1 tablet daily x 7 days. Then increase to 2 tablets daily. 49 tablet 0  . dacarbazine (DTIC) 200 MG chemo injection Inject into the vein once. To be administered Day 1 & Day 15 every 28 days. To begin 02/04/15.    Marland Kitchen DOXOrubicin HCl (ADRIAMYCIN IV) Inject into the vein. To be administered Day 1 & Day 15 every 28 days. To begin 02/04/15.    Marland Kitchen  lidocaine-prilocaine (EMLA) cream Apply a quarter size amount to port site 1 hour prior to chemo. Do not rub in. Cover with plastic wrap. 30 g 3  . LORazepam (ATIVAN) 0.5 MG tablet Take 1 tablet (0.5 mg total) by mouth every 8 (eight) hours as needed for anxiety. 30 tablet 1  . omeprazole (PRILOSEC) 40 MG capsule Take 1 capsule (40 mg total) by mouth daily. 30 capsule 3  . ondansetron (ZOFRAN) 8 MG tablet Take 1 tablet (8 mg total) by mouth every 8 (eight) hours  as needed for nausea or vomiting. 60 tablet 1  . oxyCODONE-acetaminophen (PERCOCET/ROXICET) 5-325 MG per tablet Take 1-2 tablets by mouth every 6 (six) hours as needed for severe pain. Take for Neulasta bone pain 45 tablet 0  . penicillin v potassium (VEETID) 500 MG tablet Take one tablet by mouth 3 times daily 21 tablet 0  . vinBLAStine (VELBAN) 10 MG injection Inject into the vein. To be administered Day 1 & Day 15 every 28 days. To begin 02/04/15.    Marland Kitchen prochlorperazine (COMPAZINE) 10 MG tablet Take 1 tablet (10 mg total) by mouth every 6 (six) hours as needed for nausea or vomiting. (Patient not taking: Reported on 05/27/2015) 60 tablet 1   Current Facility-Administered Medications on File Prior to Visit  Medication Dose Route Frequency Provider Last Rate Last Dose  . bleomycin (BLEOCIN) 19 Units in sodium chloride 0.9 % 50 mL chemo infusion  10 Units/m2 (Treatment Plan Actual) Intravenous Once Patrici Ranks, MD      . dacarbazine (DTIC) 720 mg in sodium chloride 0.9 % 250 mL chemo infusion  375 mg/m2 (Treatment Plan Actual) Intravenous Once Patrici Ranks, MD      . DOXOrubicin (ADRIAMYCIN) chemo injection 48 mg  25 mg/m2 (Treatment Plan Actual) Intravenous Once Patrici Ranks, MD      . fosaprepitant (EMEND) 150 mg, dexamethasone (DECADRON) 12 mg in sodium chloride 0.9 % 145 mL IVPB   Intravenous Once Patrici Ranks, MD      . heparin lock flush 100 unit/mL  500 Units Intracatheter Once PRN Patrici Ranks, MD      . heparin lock flush 100 unit/mL  250 Units Intracatheter Once PRN Patrici Ranks, MD      . sodium chloride 0.9 % injection 10 mL  10 mL Intracatheter PRN Patrici Ranks, MD      . vinBLAStine (VELBAN) 11.5 mg in sodium chloride 0.9 % 50 mL chemo infusion  6 mg/m2 (Treatment Plan Actual) Intravenous Once Patrici Ranks, MD        Past Surgical History  Procedure Laterality Date  . Wrist surgery      right  . Knee surgery    . Incision and drainage  peritonsillar abscess    . Lymph node biopsy Left 01/13/2015    Procedure: LEFT INGUINAL LYMPH NODE BIOPSY;  Surgeon: Aviva Signs Md, MD;  Location: [facility];  Service: General;  Laterality: Left;  . Bone marrow biopsy Left 01/20/15  . Bone marrow aspiration Left 01/20/15  . Portacath placement Left 01/31/2015    Procedure: INSERTION PORT-A-CATH;  Surgeon: Aviva Signs Md, MD;  Location: [facility];  Service: General;  Laterality: Left;    Denies any headaches, dizziness, double vision, fevers, chills, night sweats, nausea, vomiting, diarrhea, constipation, chest pain, heart palpitations, shortness of breath, blood in stool, black tarry stool, urinary pain, urinary burning, urinary frequency, hematuria.   PHYSICAL EXAMINATION  ECOG PERFORMANCE STATUS: 1 -  Symptomatic but completely ambulatory  Filed Vitals:   05/27/15 0900  BP: 106/57  Pulse: 79  Temp: 97.8 F (36.6 C)  Resp: 16    GENERAL:alert, no distress, well nourished, well developed, comfortable, cooperative, smiling and unaccompanied today in chemotherapy bed. SKIN: skin color, texture, turgor are normal, no rashes or significant lesions with some darkening of skin from chemotherapy, particularly on hands and fingers bilaterally. HEAD: Normocephalic, No masses, lesions, tenderness or abnormalities EYES: normal, PERRLA, EOMI, Conjunctiva are pink and non-injected EARS: External ears normal OROPHARYNX:lips, buccal mucosa, and tongue normal and mucous membranes are moist  NECK: supple, no adenopathy, thyroid normal size, non-tender, without nodularity, no stridor, non-tender, trachea midline LYMPH:  no palpable lymphadenopathy BREAST:not examined LUNGS: clear to auscultation HEART: regular rate & rhythm, no murmurs and no gallops ABDOMEN:abdomen soft, normal bowel sounds. BACK: Back symmetric, no curvature. EXTREMITIES:less then 2 second capillary refill, no joint deformities, effusion, or inflammation, no edema, no skin  discoloration, no clubbing, no cyanosis  NEURO: alert & oriented x 3 with fluent speech, no focal motor/sensory deficits, gait normal   LABORATORY DATA: CBC    Component Value Date/Time   WBC 3.6* 05/27/2015 0912   RBC 3.93* 05/27/2015 0912   HGB 11.2* 05/27/2015 0912   HCT 34.7* 05/27/2015 0912   PLT 251 05/27/2015 0912   MCV 88.3 05/27/2015 0912   MCH 28.5 05/27/2015 0912   MCHC 32.3 05/27/2015 0912   RDW 17.4* 05/27/2015 0912   LYMPHSABS 1.1 05/27/2015 0912   MONOABS 0.6 05/27/2015 0912   EOSABS 0.2 05/27/2015 0912   BASOSABS 0.0 05/27/2015 0912      Chemistry      Component Value Date/Time   NA 140 05/27/2015 0912   Acevedo 4.1 05/27/2015 0912   CL 106 05/27/2015 0912   CO2 30 05/27/2015 0912   BUN 12 05/27/2015 0912   CREATININE 1.00 05/27/2015 0912      Component Value Date/Time   CALCIUM 8.5* 05/27/2015 0912   ALKPHOS 38 05/27/2015 0912   AST 18 05/27/2015 0912   ALT 16* 05/27/2015 0912   BILITOT 0.3 05/27/2015 0912        PENDING LABS:   RADIOGRAPHIC STUDIES:  No results found.   PATHOLOGY:    ASSESSMENT AND PLAN:  Hodgkin lymphoma Stage III Hodgkin's Lymphoma with questionable involvement of lingula on PET imaging. BMBX negative. PET with mild hypermetabolism with borderline enlarged bilateral axillar and mediastinal nodes. Bilateral inguinal nodes.  Restaging PET following 2 cycles of ABVD demonstrates a complete remission with Deauville 1 noted.  He will continue with a total of 6 cycles of treatment.  Will repeat PET within 3 months of completing therapy per NCCN guidelines.  WBC is low.  Neulasta will not be given with this treatment.  May need to consider in 2 weeks with next treatment based upon count value.  He notes a decrease in taste for foods x 2 days following his last treatment.  No weight loss noted on today's data collection; he is up 2 lbs.  He is encouraged to eat, despite decreased appetite during this time.  Repeat MUGA as scheduled  on 9/30.  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  Return in 2 weeks for follow-up and treatment and 4 weeks for follow-up and treatment.   THERAPY PLAN:  Continue with treatment as planned per NCCN guidelines.    All questions were answered. The patient knows to call the clinic with any problems,  questions or concerns. We can certainly see the patient much sooner if necessary.  Patient and plan discussed with Dr. Ancil Linsey and she is in agreement with the aforementioned.   This note is electronically signed by: Doy Mince 05/27/2015 10:39 AM

## 2015-05-27 NOTE — Assessment & Plan Note (Addendum)
Stage III Hodgkin's Lymphoma with questionable involvement of lingula on PET imaging. BMBX negative. PET with mild hypermetabolism with borderline enlarged bilateral axillar and mediastinal nodes. Bilateral inguinal nodes.  Restaging PET following 2 cycles of ABVD demonstrates a complete remission with Deauville 1 noted.  He will continue with a total of 6 cycles of treatment.  Will repeat PET within 3 months of completing therapy per NCCN guidelines.  WBC is low.  Neulasta will not be given with this treatment.  May need to consider in 2 weeks with next treatment based upon count value.  He notes a decrease in taste for foods x 2 days following his last treatment.  No weight loss noted on today's data collection; he is up 2 lbs.  He is encouraged to eat, despite decreased appetite during this time.  Repeat MUGA as scheduled on 9/30.  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  Return in 2 weeks for follow-up and treatment and 4 weeks for follow-up and treatment.

## 2015-05-27 NOTE — Patient Instructions (Signed)
[facility] Discharge Instructions for Patients Receiving Chemotherapy  Today you received the following chemotherapy agents:  Adriamycin, vinblastine, bleomycin, and DTIC.  To help prevent nausea and vomiting after your treatment, we encourage you to take your nausea medication as prescribed.  If you develop nausea and vomiting, or diarrhea that is not controlled by your medication, call the clinic.  The clinic phone number is [phone]. Office hours are Monday-Friday 8:30am-5:00pm.  BELOW ARE SYMPTOMS THAT SHOULD BE REPORTED IMMEDIATELY:  *FEVER GREATER THAN 101.0 F  *CHILLS WITH OR WITHOUT FEVER  NAUSEA AND VOMITING THAT IS NOT CONTROLLED WITH YOUR NAUSEA MEDICATION  *UNUSUAL SHORTNESS OF BREATH  *UNUSUAL BRUISING OR BLEEDING  TENDERNESS IN MOUTH AND THROAT WITH OR WITHOUT PRESENCE OF ULCERS  *URINARY PROBLEMS  *BOWEL PROBLEMS  UNUSUAL RASH Items with * indicate a potential emergency and should be followed up as soon as possible. If you have an emergency after office hours please contact your primary care physician or go to the nearest emergency department.  Please call the clinic during office hours if you have any questions or concerns.   You may also contact the Patient Navigator at [phone] should you have any questions or need assistance in obtaining follow up care. _____________________________________________________________________ Have you asked about our STAR program?    STAR stands for Survivorship Training and Rehabilitation, and this is a nationally recognized cancer care program that focuses on survivorship and rehabilitation.  Cancer and cancer treatments may cause problems, such as, pain, making you feel tired and keeping you from doing the things that you need or want to do. Cancer rehabilitation can help. Our goal is to reduce these troubling effects and help you have the best quality of life possible.  You may receive a survey from  a nurse that asks questions about your current state of health.  Based on the survey results, all eligible patients will be referred to the [facility] program for an evaluation so we can better serve you! A frequently asked questions sheet is available upon request.

## 2015-05-30 ENCOUNTER — Encounter (HOSPITAL_COMMUNITY)
Admission: RE | Admit: 2015-05-30 | Discharge: 2015-05-30 | Disposition: A | Discharge status: Home / Self Care | Payer: Medicaid Other | Source: Ambulatory Visit | Attending: Oncology | Admitting: Oncology

## 2015-05-30 ENCOUNTER — Encounter (HOSPITAL_COMMUNITY): Payer: Self-pay

## 2015-05-30 DIAGNOSIS — Z79899 Other long term (current) drug therapy: Secondary | ICD-10-CM | POA: Insufficient documentation

## 2015-05-30 DIAGNOSIS — C8190 Hodgkin lymphoma, unspecified, unspecified site: Secondary | ICD-10-CM

## 2015-05-30 MED ORDER — HEPARIN SOD (PORK) LOCK FLUSH 100 UNIT/ML IV SOLN
INTRAVENOUS | Status: AC
  Filled 2015-05-30: qty 5

## 2015-05-30 MED ORDER — TECHNETIUM TC 99M-LABELED RED BLOOD CELLS IV KIT
25.0000 | PACK | Freq: Once | INTRAVENOUS | Status: AC | PRN
  Administered 2015-05-30: 25.6 via INTRAVENOUS

## 2015-06-09 ENCOUNTER — Ambulatory Visit: Payer: Self-pay | Admitting: Physician Assistant

## 2015-06-09 ENCOUNTER — Encounter: Payer: Self-pay | Admitting: Physician Assistant

## 2015-06-09 VITALS — BP 90/58 | HR 85 | Temp 98.1°F | Ht 70.25 in | Wt 170.0 lb

## 2015-06-09 DIAGNOSIS — C8190 Hodgkin lymphoma, unspecified, unspecified site: Secondary | ICD-10-CM

## 2015-06-09 NOTE — Progress Notes (Signed)
Subjective:     Patient ID: Robert Acevedo, male   DOB: 09/17/1970, 44 y.o.   MRN: [id number]  HPI  Chief Complaint  Patient presents with  . Lymphoma    pt states he is feeling better and has made it through going to chemo. pt has chemo tomorrow. pt states he goes til nov and will get a test done to see how he's doing  . Depression    pt states he has been talking to the person that caused his depression and worked out their problems. pt states he has been doing good with his depression, since he started talking to that person again.     Pt still seeing oncologist for treatment of lymphoma  Pt says he is not having depression now  Pt has medicaid now.    Review of Systems  Constitutional: Positive for chills, diaphoresis and fatigue. Negative for fever, appetite change and unexpected weight change.  HENT: Positive for dental problem and voice change. Negative for congestion, drooling, ear pain, facial swelling, hearing loss, mouth sores, sneezing, sore throat and trouble swallowing.   Eyes: Positive for visual disturbance. Negative for pain, discharge, redness and itching.  Respiratory: Positive for cough and shortness of breath. Negative for choking and wheezing.   Cardiovascular: Negative for chest pain, palpitations and leg swelling.  Gastrointestinal: Positive for blood in stool. Negative for vomiting, abdominal pain, diarrhea and constipation.  Endocrine: Negative for cold intolerance, heat intolerance and polydipsia.  Genitourinary: Negative for dysuria, hematuria and decreased urine volume.  Musculoskeletal: Positive for arthralgias. Negative for back pain and gait problem.  Skin: Negative for rash.  Allergic/Immunologic: Positive for environmental allergies.  Neurological: Positive for light-headedness. Negative for seizures, syncope and headaches.  Hematological: Negative for adenopathy.  Psychiatric/Behavioral: Positive for dysphoric mood. Negative for suicidal ideas.  The patient is not nervous/anxious.        Objective:   Physical Exam  Constitutional: He is oriented to person, place, and time. He appears well-developed and well-nourished.  HENT:  Head: Normocephalic and atraumatic.  Neck: Neck supple.  Cardiovascular: Normal rate and regular rhythm.   Pulmonary/Chest: Effort normal and breath sounds normal.  Neurological: He is alert and oriented to person, place, and time. Coordination normal.  Skin: Skin is warm and dry.  Psychiatric: He has a normal mood and affect. His behavior is normal. Judgment and thought content normal.  Vitals reviewed.      Assessment:     Lymphoma- cont with oncology Depression- resolved    Plan:     Get appt with new pcp listed on medicaid card- discussed with pt that we can no longer see him because we do not accept medicaid

## 2015-06-10 ENCOUNTER — Encounter (HOSPITAL_BASED_OUTPATIENT_CLINIC_OR_DEPARTMENT_OTHER): Payer: Medicaid Other | Admitting: Hematology & Oncology

## 2015-06-10 ENCOUNTER — Encounter (HOSPITAL_COMMUNITY): Payer: Medicaid Other | Attending: Hematology & Oncology

## 2015-06-10 ENCOUNTER — Encounter (HOSPITAL_COMMUNITY): Payer: Self-pay | Admitting: Hematology & Oncology

## 2015-06-10 VITALS — BP 101/57 | HR 70 | Temp 97.7°F | Resp 16 | Wt 172.5 lb

## 2015-06-10 VITALS — BP 114/86 | HR 82 | Temp 97.6°F | Resp 18

## 2015-06-10 DIAGNOSIS — C8115 Nodular sclerosis classical Hodgkin lymphoma, lymph nodes of inguinal region and lower limb: Secondary | ICD-10-CM

## 2015-06-10 DIAGNOSIS — Z720 Tobacco use: Secondary | ICD-10-CM | POA: Diagnosis not present

## 2015-06-10 DIAGNOSIS — D890 Polyclonal hypergammaglobulinemia: Secondary | ICD-10-CM

## 2015-06-10 DIAGNOSIS — C8100 Nodular lymphocyte predominant Hodgkin lymphoma, unspecified site: Secondary | ICD-10-CM

## 2015-06-10 DIAGNOSIS — E8809 Other disorders of plasma-protein metabolism, not elsewhere classified: Secondary | ICD-10-CM | POA: Diagnosis not present

## 2015-06-10 DIAGNOSIS — Z5111 Encounter for antineoplastic chemotherapy: Secondary | ICD-10-CM | POA: Diagnosis not present

## 2015-06-10 DIAGNOSIS — C8190 Hodgkin lymphoma, unspecified, unspecified site: Secondary | ICD-10-CM

## 2015-06-10 DIAGNOSIS — M899 Disorder of bone, unspecified: Secondary | ICD-10-CM | POA: Diagnosis not present

## 2015-06-10 LAB — COMPREHENSIVE METABOLIC PANEL
ALK PHOS: 40 U/L (ref 38–126)
ALT: 15 U/L — AB (ref 17–63)
ANION GAP: 8 (ref 5–15)
AST: 23 U/L (ref 15–41)
Albumin: 3.7 g/dL (ref 3.5–5.0)
BUN: 11 mg/dL (ref 6–20)
CALCIUM: 8.3 mg/dL — AB (ref 8.9–10.3)
CO2: 29 mmol/L (ref 22–32)
CREATININE: 1.05 mg/dL (ref 0.61–1.24)
Chloride: 103 mmol/L (ref 101–111)
GFR calc Af Amer: >60 mL/min (ref >60)
Glucose, Bld: 125 mg/dL — ABNORMAL HIGH (ref 65–99)
Potassium: 3.4 mmol/L — ABNORMAL LOW (ref 3.5–5.1)
SODIUM: 140 mmol/L (ref 135–145)
TOTAL PROTEIN: 7.3 g/dL (ref 6.5–8.1)
Total Bilirubin: 0.6 mg/dL (ref 0.3–1.2)

## 2015-06-10 LAB — CBC WITH DIFFERENTIAL/PLATELET
Basophils Absolute: 0 K/uL (ref 0.0–0.1)
Basophils Relative: 1 %
Eosinophils Absolute: 0.1 K/uL (ref 0.0–0.7)
Eosinophils Relative: 5 %
HCT: 36.3 % — ABNORMAL LOW (ref 39.0–52.0)
Hemoglobin: 11.9 g/dL — ABNORMAL LOW (ref 13.0–17.0)
Lymphocytes Relative: 39 %
Lymphs Abs: 1.1 K/uL (ref 0.7–4.0)
MCH: 28.8 pg (ref 26.0–34.0)
MCHC: 32.8 g/dL (ref 30.0–36.0)
MCV: 87.9 fL (ref 78.0–100.0)
Monocytes Absolute: 0.3 K/uL (ref 0.1–1.0)
Monocytes Relative: 12 %
Neutro Abs: 1.2 K/uL — ABNORMAL LOW (ref 1.7–7.7)
Neutrophils Relative %: 43 %
Platelets: 215 K/uL (ref 150–400)
RBC: 4.13 MIL/uL — ABNORMAL LOW (ref 4.22–5.81)
RDW: 16.8 % — ABNORMAL HIGH (ref 11.5–15.5)
WBC: 2.8 K/uL — ABNORMAL LOW (ref 4.0–10.5)

## 2015-06-10 MED ORDER — SODIUM CHLORIDE 0.9 % IJ SOLN: 10.0000 mL | INTRAMUSCULAR | Status: DC | PRN

## 2015-06-10 MED ORDER — SODIUM CHLORIDE 0.9 % IV SOLN
10.0000 [IU]/m2 | Freq: Once | INTRAVENOUS | Status: AC
  Administered 2015-06-10: 19 [IU] via INTRAVENOUS
  Filled 2015-06-10: qty 6.33

## 2015-06-10 MED ORDER — VINBLASTINE SULFATE CHEMO INJECTION 1 MG/ML
6.0000 mg/m2 | Freq: Once | INTRAVENOUS | Status: AC
  Administered 2015-06-10: 11.5 mg via INTRAVENOUS
  Filled 2015-06-10: qty 11.5

## 2015-06-10 MED ORDER — DOXORUBICIN HCL CHEMO IV INJECTION 2 MG/ML
25.0000 mg/m2 | Freq: Once | INTRAVENOUS | Status: AC
  Administered 2015-06-10: 48 mg via INTRAVENOUS
  Filled 2015-06-10: qty 24

## 2015-06-10 MED ORDER — PALONOSETRON HCL INJECTION 0.25 MG/5ML
0.2500 mg | Freq: Once | INTRAVENOUS | Status: AC
  Administered 2015-06-10: 0.25 mg via INTRAVENOUS
  Filled 2015-06-10: qty 5

## 2015-06-10 MED ORDER — SODIUM CHLORIDE 0.9 % IV SOLN
Freq: Once | INTRAVENOUS | Status: AC
  Administered 2015-06-10: 11:00:00 via INTRAVENOUS
  Filled 2015-06-10: qty 5

## 2015-06-10 MED ORDER — HEPARIN SOD (PORK) LOCK FLUSH 100 UNIT/ML IV SOLN
INTRAVENOUS | Status: AC
  Filled 2015-06-10: qty 5

## 2015-06-10 MED ORDER — SODIUM CHLORIDE 0.9 % IV SOLN
375.0000 mg/m2 | Freq: Once | INTRAVENOUS | Status: AC
  Administered 2015-06-10: 720 mg via INTRAVENOUS
  Filled 2015-06-10: qty 36

## 2015-06-10 MED ORDER — SODIUM CHLORIDE 0.9 % IV SOLN
Freq: Once | INTRAVENOUS | Status: AC
  Administered 2015-06-10: 11:00:00 via INTRAVENOUS

## 2015-06-10 MED ORDER — HEPARIN SOD (PORK) LOCK FLUSH 100 UNIT/ML IV SOLN
500.0000 [IU] | Freq: Once | INTRAVENOUS | Status: AC | PRN
  Administered 2015-06-10: 500 [IU]

## 2015-06-10 NOTE — Patient Instructions (Signed)
[place] at [facility] Discharge Instructions  RECOMMENDATIONS MADE BY THE CONSULTANT AND ANY TEST RESULTS WILL BE SENT TO YOUR REFERRING PHYSICIAN.  Chemo today as scheduled pending lab results. Return as scheduled.  Thank you for choosing [place] at [facility] to provide your oncology and hematology care.  To afford each patient quality time with our provider, please arrive at least 15 minutes before your scheduled appointment time.    You need to re-schedule your appointment should you arrive 10 or more minutes late.  We strive to give you quality time with our providers, and arriving late affects you and other patients whose appointments are after yours.  Also, if you no show three or more times for appointments you may be dismissed from the clinic at the providers discretion.     Again, thank you for choosing [facility].  Our hope is that these requests will decrease the amount of time that you wait before being seen by our physicians.       _____________________________________________________________  Should you have questions after your visit to [facility], please contact our office at [phone] between the hours of 8:30 a.m. and 4:30 p.m.  Voicemails left after 4:30 p.m. will not be returned until the following business day.  For prescription refill requests, have your pharmacy contact our office.

## 2015-06-10 NOTE — Patient Instructions (Signed)
[facility] Discharge Instructions for Patients Receiving Chemotherapy  Today you received the following chemotherapy agents  Chemotherapy as scheduled today WBC and ANC low today Neulasta tomorrow You will decide if you want the flu shot or not Return in 2 weeks to see the doctor and for chemotherapy  Please call the clinic if you have any questions or concerns  To help prevent nausea and vomiting after your treatment, we encourage you to take your nausea medication    If you develop nausea and vomiting, or diarrhea that is not controlled by your medication, call the clinic.  The clinic phone number is [phone]. Office hours are Monday-Friday 8:30am-5:00pm.  BELOW ARE SYMPTOMS THAT SHOULD BE REPORTED IMMEDIATELY:  *FEVER GREATER THAN 101.0 F  *CHILLS WITH OR WITHOUT FEVER  NAUSEA AND VOMITING THAT IS NOT CONTROLLED WITH YOUR NAUSEA MEDICATION  *UNUSUAL SHORTNESS OF BREATH  *UNUSUAL BRUISING OR BLEEDING  TENDERNESS IN MOUTH AND THROAT WITH OR WITHOUT PRESENCE OF ULCERS  *URINARY PROBLEMS  *BOWEL PROBLEMS  UNUSUAL RASH Items with * indicate a potential emergency and should be followed up as soon as possible. If you have an emergency after office hours please contact your primary care physician or go to the nearest emergency department.  Please call the clinic during office hours if you have any questions or concerns.   You may also contact the Patient Navigator at [phone] should you have any questions or need assistance in obtaining follow up care. _____________________________________________________________________ Have you asked about our STAR program?    STAR stands for Survivorship Training and Rehabilitation, and this is a nationally recognized cancer care program that focuses on survivorship and rehabilitation.  Cancer and cancer treatments may cause problems, such as, pain, making you feel tired and keeping you from doing the things that  you need or want to do. Cancer rehabilitation can help. Our goal is to reduce these troubling effects and help you have the best quality of life possible.  You may receive a survey from a nurse that asks questions about your current state of health.  Based on the survey results, all eligible patients will be referred to the [facility] program for an evaluation so we can better serve you! A frequently asked questions sheet is available upon request.

## 2015-06-10 NOTE — Progress Notes (Signed)
Pt unsure if he wants flu vaccine will decide between now and next cycle.  Per Dr Whitney Muse ok to treat with ANC 1.2  Robert Acevedo Tolerated chemotherapy well today

## 2015-06-10 NOTE — Progress Notes (Signed)
Robert Acevedo at [place] NOTE  No care team member to display  CHIEF COMPLAINTS/PURPOSE OF CONSULTATION:  Hodgkin lymphoma Staging form: Lymphoid Neoplasms, AJCC 6th Edition Clinical stage from 03/04/2015: Stage III - Unsigned Staging comments:  Questionable involvement of lingula on PET imaging. BMBX negative. PET with mild hyper metabolism with borderline enlarged bilateral axillar and mediastinal nodes. Bilateral inguinal nodes.   NOTE: patient has lytic lesion in the pelvis that are NOT PET avid. Not biopsied  L inquinal LN biopsy on 01/13/2015 with final pathology showing Classical Hodgkin Lymphoma, nodular sclerosing type Bone marrow biopsy 01/20/2015 without evidence of malignancy    Hodgkin lymphoma ([place])   01/13/2015 Initial Biopsy L inguinal LN biopsy on 01/13/2015 with Classical Hodgkin Lymphoma, nodular sclerosing type   01/20/2015 Bone Marrow Biopsy normal BMBX, mild polyclonal plasmacytosis   01/28/2015 Imaging PET/CT Mild hyper metabolism is associated with the borderline enlarged bilateral axillary and mediastinal lymph nodes. There is more intense FDG uptake associated with bilateral inguinal lymph nodes. focal area of peripheral consolidation in the lingula.   01/29/2015 Imaging MUGA with EF 69%   01/29/2015 Imaging 1.spirometry is normal .no change with bronchodilator. lung volumes are normal. airway resistance somewhat high. DLCO normal decreased MIP/MEP may indicate weakness of the bellows function of the lung.    02/04/2015 -  Chemotherapy ABVD   03/28/2015 PET scan No adenopathy or abnormal hypermetabolic activity along the previous axillary, mediastinal, and inguinal lymph node chains. Deauville 1.   03/31/2015 Imaging MUGA- Normal LEFT ventricular ejection fraction of 70%, not significantly changed from the 69% calculated on the previous study of 01/29/2015.     HISTORY OF PRESENTING ILLNESS:  Robert Acevedo 44 y.o. male is here for additional  follow-up of Hodgkin Lymphoma.   He is present alone today and likes to be called Robert Acevedo He denies nausea, vomiting, diarrhea or constipation. He denies fever or chills.  The patient states that he has been doing well.  His appetite and sleeping is normal.  He notes his hands tingle intermittently.  He is not lifting weights right now but does do push-ups and pull-ups.  He states his breathing is well.  He is currently smoking 1 pack every 2-3 days.  He notes that he is working on stopping.  He would like to start taking a B12 supplement.  No major complaints.  MEDICAL HISTORY:  Past Medical History  Diagnosis Date  . Bronchitis   . Lymphadenopathy, inguinal     left  . Hodgkin's lymphoma ([place])     SURGICAL HISTORY: Past Surgical History  Procedure Laterality Date  . Wrist surgery      right  . Knee surgery    . Incision and drainage peritonsillar abscess    . Lymph node biopsy Left 01/13/2015    Procedure: LEFT INGUINAL LYMPH NODE BIOPSY;  Surgeon: Aviva Signs Md, MD;  Location: [facility];  Service: General;  Laterality: Left;  . Bone marrow biopsy Left 01/20/15  . Bone marrow aspiration Left 01/20/15  . Portacath placement Left 01/31/2015    Procedure: INSERTION PORT-A-CATH;  Surgeon: Aviva Signs Md, MD;  Location: [facility];  Service: General;  Laterality: Left;    SOCIAL HISTORY: Social History   Social History  . Marital Status: Single    Spouse Name: N/A  . Number of Children: N/A  . Years of Education: N/A   Occupational History  . Not on file.   Social History Main Topics  .  Smoking status: Current Every Day Smoker -- 0.50 packs/day for 2 years    Types: Cigarettes  . Smokeless tobacco: Never Used  . Alcohol Use: Yes     Comment: occasional  . Drug Use: Yes    Special: Marijuana     Comment: on occasion - last used 01/26/15  . Sexual Activity: Yes    Birth Control/ Protection: None   Other Topics Concern  . Not on file   Social History Narrative  Pt is a  smoker starting 2008, pack of cigarettes a week. Infrequent drinking alcohol, maybe one or two once a month. Denies drug use other than daily marijuana. In jail for 18 months.  Has 3 kids, 6 yo, 69 yo, and 67 yo. Has 1 grandchild. He has a car.   FAMILY HISTORY: Family History  Problem Relation Age of Onset  . Seizures Mother   . Diabetes Father   Father is 77 yo diabetic. Mother is 14 yo. Both still living. 1 brother, 4 sisters. One sister has breast cancer, diagnosed 1996 in her 76s. Another sister has severe depression. He has been with his girlfriend 9-10 years. He lives with his girlfriend and his father. He hasn't told his father about his diagnosis yet. One sister lives in [place] and she is aware of his diagnosis. He told two of his childhood friends about his diagnosis.   ALLERGIES:  is allergic to clindamycin/lincomycin.  MEDICATIONS:  Current Outpatient Prescriptions  Medication Sig Dispense Refill  . amitriptyline (ELAVIL) 25 MG tablet Take one tablet at bedtime for 5 days then increase to two tablets thereafter (Patient taking differently: Take 50 mg by mouth at bedtime. Take one tablet at bedtime for 5 days then increase to two tablets thereafter) 60 tablet 3  . bleomycin (BLEOCIN) 15 UNITS injection Inject into the vein once. To be administered Day 1 & Day 15 every 28 days. To begin 02/04/15.    . citalopram (CELEXA) 20 MG tablet Take 1 tablet daily x 7 days. Then increase to 2 tablets daily. 49 tablet 0  . dacarbazine (DTIC) 200 MG chemo injection Inject into the vein once. To be administered Day 1 & Day 15 every 28 days. To begin 02/04/15.    Marland Kitchen DOXOrubicin HCl (ADRIAMYCIN IV) Inject into the vein. To be administered Day 1 & Day 15 every 28 days. To begin 02/04/15.    . lidocaine-prilocaine (EMLA) cream Apply a quarter size amount to port site 1 hour prior to chemo. Do not rub in. Cover with plastic wrap. 30 g 3  . omeprazole (PRILOSEC) 40 MG capsule Take 1 capsule (40 mg total)  by mouth daily. 30 capsule 3  . ondansetron (ZOFRAN) 8 MG tablet Take 1 tablet (8 mg total) by mouth every 8 (eight) hours as needed for nausea or vomiting. 60 tablet 1  . oxyCODONE-acetaminophen (PERCOCET/ROXICET) 5-325 MG tablet Take 1-2 tablets by mouth every 6 (six) hours as needed for severe pain. Take for Neulasta bone pain 45 tablet 0  . vinBLAStine (VELBAN) 10 MG injection Inject into the vein. To be administered Day 1 & Day 15 every 28 days. To begin 02/04/15.    . chlorproMAZINE (THORAZINE) 25 MG tablet Take 1 tablet (25 mg total) by mouth 3 (three) times daily. (Patient not taking: Reported on 06/09/2015) 15 tablet 0  . LORazepam (ATIVAN) 0.5 MG tablet Take 1 tablet (0.5 mg total) by mouth every 8 (eight) hours as needed for anxiety. (Patient not taking: Reported on 06/10/2015) 30  tablet 1  . penicillin v potassium (VEETID) 500 MG tablet Take one tablet by mouth 3 times daily (Patient not taking: Reported on 06/09/2015) 21 tablet 0  . prochlorperazine (COMPAZINE) 10 MG tablet Take 1 tablet (10 mg total) by mouth every 6 (six) hours as needed for nausea or vomiting. (Patient not taking: Reported on 05/27/2015) 60 tablet 1   No current facility-administered medications for this visit.    Review of Systems  Constitutional: Negative for weight loss and diaphoresis. Negative for fever, chills and malaise/fatigue.  HENT: Negative for congestion, ear discharge, ear pain, hearing loss, nosebleeds, sore throat and tinnitus.   Eyes: Negative for blurred vision. Negative for double vision, photophobia, pain, discharge and redness.  Respiratory: Negative.  Negative for stridor.   Cardiovascular: Negative.   Gastrointestinal: Negative for heartburn, nausea, vomiting, diarrhea, blood in stool and melena. Genitourinary: Negative for dysuria, frequency, hematuria and flank pain.  Musculoskeletal: Negative.   Skin: Negative.   Neurological: Negative for dizziness, tingling, tremors, sensory change,  speech change, focal weakness, seizures, loss of consciousness and weakness.  Endo/Heme/Allergies: Negative.   Psychiatric/Behavioral: Positive for substance abuse. Negative for depression, suicidal ideas, hallucinations and memory loss.  The patient is not nervous/anxious.    14 point ROS was done and is otherwise as detailed above or in HPI   PHYSICAL EXAMINATION: ECOG PERFORMANCE STATUS: 1 - Symptomatic but completely ambulatory  Filed Vitals:   06/10/15 0847  BP: 101/57  Pulse: 70  Temp: 97.7 F (36.5 C)  Resp: 16   Filed Weights   06/10/15 0847  Weight: 172 lb 8 oz (78.245 kg)   Physical Exam  Constitutional: He is oriented to person, place, and time and well-developed, well-nourished, and in no distress.  HENT:  Head: Normocephalic and atraumatic.  Nose: Nose normal.  Mouth/Throat: Oropharynx is clear and moist. No oropharyngeal exudate.  Eyes: Conjunctivae and EOM are normal. Pupils are equal, round, and reactive to light. Right eye exhibits no discharge. Left eye exhibits no discharge. No scleral icterus.  Neck: Normal range of motion. Neck supple. No tracheal deviation present. No thyromegaly present.  Cardiovascular: Normal rate, regular rhythm and normal heart sounds.  Exam reveals no gallop and no friction rub.  No murmur heard. Pulmonary/Chest: Effort normal and breath sounds normal. He has no wheezes. He has no rales. Port a cath accessed L chest Abdominal: Soft. Bowel sounds are normal. He exhibits no distension and no mass. There is no tenderness. There is no rebound and no guarding.  Genitourinary:  deferred Musculoskeletal: Normal range of motion. He exhibits no edema.   Neurological: He is alert and oriented to person, place, and time. He has normal reflexes. No cranial nerve deficit. Gait normal. Coordination normal.  Skin: Skin is warm and dry. No rash noted.  Psychiatric: Mood, memory, affect and judgment normal.  Nursing note and vitals  reviewed.   LABORATORY DATA:  I have reviewed the data as listed. Lab Results  Component Value Date   WBC 2.8* 06/10/2015   HGB 11.9* 06/10/2015   HCT 36.3* 06/10/2015   MCV 87.9 06/10/2015   PLT 215 06/10/2015   CMP     Component Value Date/Time   NA 140 06/10/2015 0924   K 3.4* 06/10/2015 0924   CL 103 06/10/2015 0924   CO2 29 06/10/2015 0924   GLUCOSE 125* 06/10/2015 0924   BUN 11 06/10/2015 0924   CREATININE 1.05 06/10/2015 0924   CALCIUM 8.3* 06/10/2015 0924   PROT 7.3 06/10/2015  2423   ALBUMIN 3.7 06/10/2015 0924   AST 23 06/10/2015 0924   ALT 15* 06/10/2015 0924   ALKPHOS 40 06/10/2015 0924   BILITOT 0.6 06/10/2015 0924   GFRNONAA >60 06/10/2015 0924   GFRAA >60 06/10/2015 0924     PATHOLOGY:  BMBXIAGNOSIS Diagnosis Bone Marrow, Aspirate,Biopsy, and Clot, left iliac - NORMOCELLULAR BONE MARROW FOR AGE WITH TRILINEAGE HEMATOPOIESIS. - MILD POLYCLONAL PLASMACYTOSIS - NO TUMOR IDENTIFIED. PERIPHERAL BLOOD: - NORMOCYTIC-NORMOCHROMIC ANEMIA. Diagnosis Note There is no morphologic evidence of involvement by Hodgkin lymphoma in this material. Clinical correlation is recommended. (BNS:ecj 01/22/2015) Susanne Greenhouse MD Pathologist, Electronic Signature  LYMPH NODE BIOPSY Diagnosis Lymph node for lymphoma, left inguinal - CLASSICAL HODGKIN LYMPHOMA, NODULAR SCLEROSIS TYPE. - SEE ONCOLOGY TABLE. Microscopic Comment LYMPHOMA Histologic type: Classical Hodgkin lymphoma, nodular sclerosis type. Grade (if applicable): N/A. Flow cytometry: Not performed. Immunohistochemical stains: CD45, CD20, CD79a, CD3, CD30, and CD15. Touch preps/imprints: Mixed lymphoid population with scattered large cells with bi-/multi-nucleation and prominent nucleoli. Comments: Sections of lymph nodes reveal effacement of the architecture by a nodular lymphoreticular proliferation. There are abundant large cells with bi-/multi-nucleation and prominent nucleoli, consistent with  Hodgkin Reed-Sternberg (HRS) cells. These occur both scattered and in large loose clusters. There are lacunar variants and mummified cells. Focally, there are broad bands of fibrosis creating a more nodular appearance. In these areas the background reveals small lymphocytes and plasma cells with scattered eosinophils and neutrophils, while less fibrotic areas have a more lymphocytic background. The smaller node is only partially involved with areas of residual reactive lymph node. Immunohistochemistry reveals the HRS cells are positive for CD30 and CD15. They are negative for CD20, CD79a, and CD45. CD20 and CD79 highlight residual B-cell areas. CD3 highlights abundant T-cells. The overall findings are consistent with classical Hodgkin lymphoma, nodular sclerosis type. The case was called to Drs. Jenkins and Firestone on 01/15/2015. Vicente Males MD Pathologist, Electronic Signature (Case signed 01/15/2015) Specimen Gross and Clinical Information   RADIOGRAPHIC STUDIES:  CLINICAL DATA: Subsequent treatment strategy for Hodgkin's lymphoma.  EXAM: NUCLEAR MEDICINE PET SKULL BASE TO THIGH  TECHNIQUE: 8.0 mCi F-18 FDG was injected intravenously. Full-ring PET imaging was performed from the skull base to thigh after the radiotracer. CT data was obtained and used for attenuation correction and anatomic localization.  FASTING BLOOD GLUCOSE: Value: 87 mg/dl  COMPARISON: Multiple exams, including 01/28/2015  FINDINGS: NECK  Asymmetry along the tonsillar pillars, left measuring 5.3 and right 3.8, probably incidental, not appreciably changed from prior.  Incidentally there is periapical lucency associated with the more lateral right maxillary premolar along with a considerable dental cavity. Other right molar dental cavities are also present.  CHEST  The prior axillary lymph nodes are no longer enlarged and no longer hypermetabolic. There are only tiny residual normal sized  nodes along the axillary and subpectoral chains currently. No pathologic mediastinal adenopathy. For Deauville documentation purposes, mediastinal background vascular activity is 1.8 and representative hepatic activity is 2.2 in standard uptake value. The remaining tiny left axillary lymph nodes have a maximum standard uptake value of approximately 0.9, not above background.  ABDOMEN/PELVIS  No abnormal hypermetabolic activity within the liver, pancreas, adrenal glands, or spleen. No hypermetabolic lymph nodes in the abdomen or pelvis. Specifically, the hypermetabolic inguinal lymph nodes have resolved.  SKELETON  Diffuse increased activity throughout the skeleton. Lucent lesions observed in the pelvis, once again not hypermetabolic.  IMPRESSION: 1. No adenopathy or abnormal hypermetabolic activity along the previous axillary, mediastinal, and inguinal lymph  node chains. Deauville 1. 2. Asymmetry along the tonsillar pillars, left greater than right, probably incidental/physiologic. 3. Diffuse increased activity throughout the skeleton, likely from marrow stimulation therapy. 4. Right maxillary tooth decay, including a periapical lucency involving the lateral right maxillary premolar. Consider dental referral.   Electronically Signed  By: Van Clines M.D.  On: 03/28/2015 13:24 I have personally reviewed the radiological images as listed and agreed with the findings in the report.  ASSESSMENT & PLAN:  Hodgkin Lymphoma, Nodular sclerosing type, Stage III-2A (? involvement in lingula) Abnormal CT of the pelvis with lytic bone disease, not PET avid Polyclonal gammopathy Elevated ESR IPS Score: 5 year freedom from progression and OS of 2; 80% FFP and 91% OS Eye EXAM on 01/29/2015 with possible lymphoma lesion in retina OD (Dr. Iona Hansen) PET/CT after 2 cycles Deauville 1  Plan is for continuation of ABVD in accordance with NCCN guidelines. He will complete a total  of 6 cycles with repeat imaging post. Again neutropenic precautions and teaching will be done today.  He is doing quite well. He will return in 2 weeks for C6D1 of therapy. I will see him back at that time.  We discussed future diagnostic plans, including a possible MRI study of the orbits of the eye, as well as looking into the abnormality in his pelvic bone. Paget's disease ? Suspected. We will need additional follow-up with his eye doctor, Dr. Iona Hansen   All questions were answered. The patient knows to call the clinic with any problems, questions or concerns.   This note was electronically signed.    This document serves as a record of services personally performed by Ancil Linsey, MD. It was created on her behalf by Janace Hoard, a trained medical scribe. The creation of this record is based on the scribe's personal observations and the provider's statements to them. This document has been checked and approved by the attending provider.  I have reviewed the above documentation for accuracy and completeness, and I agree with the above.  Kelby Fam. Whitney Muse, MD

## 2015-06-11 ENCOUNTER — Encounter (HOSPITAL_BASED_OUTPATIENT_CLINIC_OR_DEPARTMENT_OTHER): Payer: Medicaid Other

## 2015-06-11 ENCOUNTER — Encounter (HOSPITAL_COMMUNITY): Payer: Self-pay

## 2015-06-11 ENCOUNTER — Telehealth (HOSPITAL_COMMUNITY): Payer: Self-pay | Admitting: *Deleted

## 2015-06-11 DIAGNOSIS — C8115 Nodular sclerosis classical Hodgkin lymphoma, lymph nodes of inguinal region and lower limb: Secondary | ICD-10-CM | POA: Diagnosis not present

## 2015-06-11 DIAGNOSIS — C8190 Hodgkin lymphoma, unspecified, unspecified site: Secondary | ICD-10-CM

## 2015-06-11 DIAGNOSIS — Z5189 Encounter for other specified aftercare: Secondary | ICD-10-CM

## 2015-06-11 MED ORDER — PEGFILGRASTIM INJECTION 6 MG/0.6ML ~~LOC~~
PREFILLED_SYRINGE | SUBCUTANEOUS | Status: AC
  Filled 2015-06-11: qty 0.6

## 2015-06-11 MED ORDER — PEGFILGRASTIM INJECTION 6 MG/0.6ML ~~LOC~~
6.0000 mg | PREFILLED_SYRINGE | Freq: Once | SUBCUTANEOUS | Status: AC
  Administered 2015-06-11: 6 mg via SUBCUTANEOUS

## 2015-06-11 MED ORDER — POTASSIUM CHLORIDE CRYS ER 10 MEQ PO TBCR: 10.0000 meq | EXTENDED_RELEASE_TABLET | Freq: Two times a day (BID) | ORAL | Status: DC

## 2015-06-11 NOTE — Patient Instructions (Signed)
Neulasta given Follow up as scheduled Please call the clinic if you have any questions or concerns

## 2015-06-11 NOTE — Progress Notes (Signed)
Robert Acevedo's reason for visit today is for an injection as scheduled per MD orders.  Robert Acevedo also received neulasta per MD orders; see [facility] for administration details.  Robert Acevedo tolerated all procedures well and without incident; questions were answered and patient was discharged.

## 2015-06-11 NOTE — Telephone Encounter (Signed)
Patient notified that k dur was called to his drug store and instructed to take 1 twice a day.

## 2015-06-11 NOTE — Telephone Encounter (Signed)
-----   Message from Patrici Ranks, MD sent at 06/10/2015  5:37 PM EDT ----- k dur 10 meq po bid. Dr.P

## 2015-06-13 ENCOUNTER — Other Ambulatory Visit (HOSPITAL_COMMUNITY): Payer: Self-pay | Admitting: Oncology

## 2015-06-13 DIAGNOSIS — C8190 Hodgkin lymphoma, unspecified, unspecified site: Secondary | ICD-10-CM

## 2015-06-13 MED ORDER — ONDANSETRON HCL 8 MG PO TABS: 8.0000 mg | ORAL_TABLET | Freq: Three times a day (TID) | ORAL | Status: DC | PRN

## 2015-06-16 ENCOUNTER — Other Ambulatory Visit (HOSPITAL_COMMUNITY): Payer: Self-pay | Admitting: Oncology

## 2015-06-16 DIAGNOSIS — C8190 Hodgkin lymphoma, unspecified, unspecified site: Secondary | ICD-10-CM

## 2015-06-16 MED ORDER — OXYCODONE-ACETAMINOPHEN 5-325 MG PO TABS: 1.0000 | ORAL_TABLET | Freq: Four times a day (QID) | ORAL | Status: DC | PRN

## 2015-06-23 NOTE — Assessment & Plan Note (Signed)
Stage III Hodgkin's Lymphoma with questionable involvement of lingula on PET imaging. BMBX negative. PET with mild hypermetabolism with borderline enlarged bilateral axillar and mediastinal nodes. Bilateral inguinal nodes.  Restaging PET following 2 cycles of ABVD demonstrates a complete remission with Deauville 1 noted.  Oncology history is updated.  He will continue with a total of 6 cycles of treatment.  Will repeat PET within 3 months of completing therapy per NCCN guidelines.    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  He will need follow-up with his ophthalmologist, Dr. Iona Hansen.  Return in 2 weeks for follow-up and treatment which will mark his last treatment, as which time further testing of aforementioned issues can be addressed and surveillance can be initiated in accordance with the NCCN guidelines.

## 2015-06-23 NOTE — Progress Notes (Signed)
No primary care provider on file. No primary provider on file.  Hodgkin lymphoma, unspecified Hodgkin lymphoma type, unspecified body region ([place])  CURRENT THERAPY: S/P cycle 5 of ABVD  INTERVAL HISTORY: Robert Acevedo 44 y.o. male returns for followup of Stage III Hodgkin's Lymphoma with questionable involvement of lingula on PET imaging. BMBX negative. PET with mild hypermetabolism with borderline enlarged bilateral axillary and mediastinal nodes. Bilateral inguinal nodes.  Restaging PET following 2 cycles of ABVD demonstrates a complete remission with Deauville 1 noted.    Hodgkin lymphoma ([place])   01/13/2015 Initial Biopsy L inguinal LN biopsy on 01/13/2015 with Classical Hodgkin Lymphoma, nodular sclerosing type   01/20/2015 Bone Marrow Biopsy normal BMBX, mild polyclonal plasmacytosis   01/28/2015 Imaging PET/CT Mild hyper metabolism is associated with the borderline enlarged bilateral axillary and mediastinal lymph nodes. There is more intense FDG uptake associated with bilateral inguinal lymph nodes. focal area of peripheral consolidation in the lingula.   01/29/2015 Imaging MUGA with EF 69%   01/29/2015 Imaging 1.spirometry is normal .no change with bronchodilator. lung volumes are normal. airway resistance somewhat high. DLCO normal decreased MIP/MEP may indicate weakness of the bellows function of the lung.    02/04/2015 -  Chemotherapy ABVD   03/28/2015 PET scan No adenopathy or abnormal hypermetabolic activity along the previous axillary, mediastinal, and inguinal lymph node chains. Deauville 1.   03/31/2015 Imaging MUGA- Normal LEFT ventricular ejection fraction of 70%, not significantly changed from the 69% calculated on the previous study of 01/29/2015.   05/30/2015 Imaging MUGA- No significant change in ejection fraction equal 73%    I personally reviewed and went over laboratory results with the patient.  The results are noted within this dictation.  WBC is WNL,  therefore, no role for Neulasta with this day of treatment.  He reports that he is doing well.  He notes minor peripheral neuropathy symptoms that resolve spontaneously.  He notes that it comes and goes and resolves.  He denies any currently.  He notes that it occurs with activity, ie holding something such as a cell phone or steering wheel.  He notices more 2-3 days following treatment and then it nearly resolves.    Past Medical History  Diagnosis Date  . Bronchitis   . Lymphadenopathy, inguinal     left  . Hodgkin's lymphoma ([place])     has Lytic bone lesion of hip; Abnormal presence of protein in urine; and Hodgkin lymphoma ([place]) on his problem list.     is allergic to clindamycin/lincomycin.  Current Outpatient Prescriptions on File Prior to Visit  Medication Sig Dispense Refill  . amitriptyline (ELAVIL) 25 MG tablet Take one tablet at bedtime for 5 days then increase to two tablets thereafter (Patient taking differently: Take 50 mg by mouth at bedtime. Take one tablet at bedtime for 5 days then increase to two tablets thereafter) 60 tablet 3  . bleomycin (BLEOCIN) 15 UNITS injection Inject into the vein once. To be administered Day 1 & Day 15 every 28 days. To begin 02/04/15.    . chlorproMAZINE (THORAZINE) 25 MG tablet Take 1 tablet (25 mg total) by mouth 3 (three) times daily. (Patient not taking: Reported on 06/09/2015) 15 tablet 0  . citalopram (CELEXA) 20 MG tablet Take 1 tablet daily x 7 days. Then increase to 2 tablets daily. 49 tablet 0  . dacarbazine (DTIC) 200 MG chemo injection Inject into the vein once. To be administered Day 1 &  Day 15 every 28 days. To begin 02/04/15.    Marland Kitchen DOXOrubicin HCl (ADRIAMYCIN IV) Inject into the vein. To be administered Day 1 & Day 15 every 28 days. To begin 02/04/15.    . lidocaine-prilocaine (EMLA) cream Apply a quarter size amount to port site 1 hour prior to chemo. Do not rub in. Cover with plastic wrap. 30 g 3  . LORazepam (ATIVAN) 0.5 MG tablet Take  1 tablet (0.5 mg total) by mouth every 8 (eight) hours as needed for anxiety. (Patient not taking: Reported on 06/10/2015) 30 tablet 1  . omeprazole (PRILOSEC) 40 MG capsule Take 1 capsule (40 mg total) by mouth daily. 30 capsule 3  . ondansetron (ZOFRAN) 8 MG tablet Take 1 tablet (8 mg total) by mouth every 8 (eight) hours as needed for nausea or vomiting. 60 tablet 1  . oxyCODONE-acetaminophen (PERCOCET/ROXICET) 5-325 MG tablet Take 1-2 tablets by mouth every 6 (six) hours as needed for severe pain. Take for Neulasta bone pain 45 tablet 0  . penicillin v potassium (VEETID) 500 MG tablet Take one tablet by mouth 3 times daily (Patient not taking: Reported on 06/09/2015) 21 tablet 0  . potassium chloride SA (K-DUR,KLOR-CON) 10 MEQ tablet Take 1 tablet (10 mEq total) by mouth 2 (two) times daily. 60 tablet 0  . prochlorperazine (COMPAZINE) 10 MG tablet Take 1 tablet (10 mg total) by mouth every 6 (six) hours as needed for nausea or vomiting. (Patient not taking: Reported on 05/27/2015) 60 tablet 1  . vinBLAStine (VELBAN) 10 MG injection Inject into the vein. To be administered Day 1 & Day 15 every 28 days. To begin 02/04/15.     Current Facility-Administered Medications on File Prior to Visit  Medication Dose Route Frequency Provider Last Rate Last Dose  . bleomycin (BLEOCIN) 19 Units in sodium chloride 0.9 % 50 mL chemo infusion  10 Units/m2 (Treatment Plan Actual) Intravenous Once Patrici Ranks, MD      . dacarbazine (DTIC) 720 mg in sodium chloride 0.9 % 250 mL chemo infusion  375 mg/m2 (Treatment Plan Actual) Intravenous Once Patrici Ranks, MD      . DOXOrubicin (ADRIAMYCIN) chemo injection 48 mg  25 mg/m2 (Treatment Plan Actual) Intravenous Once Patrici Ranks, MD      . fosaprepitant (EMEND) 150 mg, dexamethasone (DECADRON) 12 mg in sodium chloride 0.9 % 145 mL IVPB   Intravenous Once Patrici Ranks, MD 454 mL/hr at 06/24/15 1115    . heparin lock flush 100 unit/mL  500 Units  Intracatheter Once PRN Patrici Ranks, MD      . sodium chloride 0.9 % injection 10 mL  10 mL Intracatheter PRN Patrici Ranks, MD      . vinBLAStine (VELBAN) 11.5 mg in sodium chloride 0.9 % 50 mL chemo infusion  6 mg/m2 (Treatment Plan Actual) Intravenous Once Patrici Ranks, MD        Past Surgical History  Procedure Laterality Date  . Wrist surgery      right  . Knee surgery    . Incision and drainage peritonsillar abscess    . Lymph node biopsy Left 01/13/2015    Procedure: LEFT INGUINAL LYMPH NODE BIOPSY;  Surgeon: Aviva Signs Md, MD;  Location: [facility];  Service: General;  Laterality: Left;  . Bone marrow biopsy Left 01/20/15  . Bone marrow aspiration Left 01/20/15  . Portacath placement Left 01/31/2015    Procedure: INSERTION PORT-A-CATH;  Surgeon: Aviva Signs Md, MD;  Location:  [facility];  Service: General;  Laterality: Left;    Denies any headaches, dizziness, double vision, fevers, chills, night sweats, nausea, vomiting, diarrhea, constipation, chest pain, heart palpitations, shortness of breath, blood in stool, black tarry stool, urinary pain, urinary burning, urinary frequency, hematuria.   PHYSICAL EXAMINATION  ECOG PERFORMANCE STATUS: 1 - Symptomatic but completely ambulatory  There were no vitals filed for this visit.  GENERAL:alert, no distress, well nourished, well developed, comfortable, cooperative, smiling and unaccompanied today in chemotherapy bed. SKIN: skin color, texture, turgor are normal, no rashes or significant lesions with some darkening of skin from chemotherapy, particularly on hands and fingers bilaterally, nails are darkened. HEAD: Normocephalic, No masses, lesions, tenderness or abnormalities EYES: normal, PERRLA, EOMI, Conjunctiva are pink and non-injected EARS: External ears normal OROPHARYNX:lips, buccal mucosa, and tongue normal and mucous membranes are moist  NECK: supple, no adenopathy, thyroid normal size, non-tender, without nodularity,  no stridor, non-tender, trachea midline LYMPH:  no palpable lymphadenopathy BREAST:not examined LUNGS: clear to auscultation and percussion. HEART: regular rate & rhythm, no murmurs and no gallops ABDOMEN:abdomen soft, normal bowel sounds. BACK: Back symmetric, no curvature. EXTREMITIES:less then 2 second capillary refill, no joint deformities, effusion, or inflammation, no edema, no skin discoloration, no clubbing, no cyanosis  NEURO: alert & oriented x 3 with fluent speech, no focal motor/sensory deficits, gait normal   LABORATORY DATA: CBC    Component Value Date/Time   WBC 6.7 06/24/2015 1000   RBC 4.22 06/24/2015 1000   HGB 12.1* 06/24/2015 1000   HCT 37.2* 06/24/2015 1000   PLT 186 06/24/2015 1000   MCV 88.2 06/24/2015 1000   MCH 28.7 06/24/2015 1000   MCHC 32.5 06/24/2015 1000   RDW 17.4* 06/24/2015 1000   LYMPHSABS 1.2 06/24/2015 1000   MONOABS 0.8 06/24/2015 1000   EOSABS 0.2 06/24/2015 1000   BASOSABS 0.0 06/24/2015 1000      Chemistry      Component Value Date/Time   NA 139 06/24/2015 1000   K 4.1 06/24/2015 1000   CL 105 06/24/2015 1000   CO2 29 06/24/2015 1000   BUN 13 06/24/2015 1000   CREATININE 1.12 06/24/2015 1000      Component Value Date/Time   CALCIUM 9.0 06/24/2015 1000   ALKPHOS 47 06/24/2015 1000   AST 17 06/24/2015 1000   ALT 13* 06/24/2015 1000   BILITOT 0.5 06/24/2015 1000        PENDING LABS:   RADIOGRAPHIC STUDIES:  Nm Cardiac Muga Rest  05/30/2015  CLINICAL DATA:  Lymphoma cancer. Evaluate cardiac function in relation to chemotherapy. EXAM: NUCLEAR MEDICINE CARDIAC BLOOD POOL IMAGING (MUGA) TECHNIQUE: Cardiac multi-gated acquisition was performed at rest following intravenous injection of Tc-59mlabeled red blood cells. RADIOPHARMACEUTICALS:  25.6 mCi Tc-952mDP in-vitro labeled red blood cells IV COMPARISON:  03/31/2015 FINDINGS: No  focal wall motion abnormality of the left ventricle. Calculated left ventricular ejection fraction  equals 73%. This compares to ejection fraction of 70% on 03/31/2015 IMPRESSION: No significant change in ejection fraction equal 73% Electronically Signed   By: StSuzy Bouchard.D.   On: 05/30/2015 14:59     PATHOLOGY:    ASSESSMENT AND PLAN:  Hodgkin lymphoma Stage III Hodgkin's Lymphoma with questionable involvement of lingula on PET imaging. BMBX negative. PET with mild hypermetabolism with borderline enlarged bilateral axillar and mediastinal nodes. Bilateral inguinal nodes.  Restaging PET following 2 cycles of ABVD demonstrates a complete remission with Deauville 1 noted.  Oncology history is updated.  He will  continue with a total of 6 cycles of treatment.  Will repeat PET within 3 months of completing therapy per NCCN guidelines.    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  He will need follow-up with his ophthalmologist, Dr. Iona Hansen.  Return in 2 weeks for follow-up and treatment which will mark his last treatment, as which time further testing of aforementioned issues can be addressed and surveillance can be initiated in accordance with the NCCN guidelines.     THERAPY PLAN:  Continue with treatment as planned per NCCN guidelines.    All questions were answered. The patient knows to call the clinic with any problems, questions or concerns. We can certainly see the patient much sooner if necessary.  Patient and plan discussed with Dr. Ancil Linsey and she is in agreement with the aforementioned.   This note is electronically signed by: Doy Mince 06/24/2015 11:29 AM

## 2015-06-24 ENCOUNTER — Encounter (HOSPITAL_BASED_OUTPATIENT_CLINIC_OR_DEPARTMENT_OTHER): Payer: Medicaid Other

## 2015-06-24 ENCOUNTER — Encounter (HOSPITAL_BASED_OUTPATIENT_CLINIC_OR_DEPARTMENT_OTHER): Payer: Medicaid Other | Admitting: Oncology

## 2015-06-24 ENCOUNTER — Ambulatory Visit (HOSPITAL_COMMUNITY): Payer: Medicaid Other | Admitting: Hematology & Oncology

## 2015-06-24 VITALS — BP 98/54 | HR 51 | Temp 98.0°F | Resp 16 | Wt 173.8 lb

## 2015-06-24 DIAGNOSIS — Z5111 Encounter for antineoplastic chemotherapy: Secondary | ICD-10-CM | POA: Diagnosis present

## 2015-06-24 DIAGNOSIS — C8190 Hodgkin lymphoma, unspecified, unspecified site: Secondary | ICD-10-CM

## 2015-06-24 DIAGNOSIS — C8115 Nodular sclerosis classical Hodgkin lymphoma, lymph nodes of inguinal region and lower limb: Secondary | ICD-10-CM | POA: Diagnosis not present

## 2015-06-24 DIAGNOSIS — G622 Polyneuropathy due to other toxic agents: Secondary | ICD-10-CM | POA: Diagnosis not present

## 2015-06-24 DIAGNOSIS — E8809 Other disorders of plasma-protein metabolism, not elsewhere classified: Secondary | ICD-10-CM | POA: Diagnosis not present

## 2015-06-24 DIAGNOSIS — C8105 Nodular lymphocyte predominant Hodgkin lymphoma, lymph nodes of inguinal region and lower limb: Secondary | ICD-10-CM

## 2015-06-24 LAB — CBC WITH DIFFERENTIAL/PLATELET
BASOS PCT: 0 %
Basophils Absolute: 0 10*3/uL (ref 0.0–0.1)
EOS ABS: 0.2 10*3/uL (ref 0.0–0.7)
EOS PCT: 2 %
HCT: 37.2 % — ABNORMAL LOW (ref 39.0–52.0)
HEMOGLOBIN: 12.1 g/dL — AB (ref 13.0–17.0)
LYMPHS PCT: 18 %
Lymphs Abs: 1.2 10*3/uL (ref 0.7–4.0)
MCH: 28.7 pg (ref 26.0–34.0)
MCHC: 32.5 g/dL (ref 30.0–36.0)
MCV: 88.2 fL (ref 78.0–100.0)
Monocytes Absolute: 0.8 10*3/uL (ref 0.1–1.0)
Monocytes Relative: 12 %
NEUTROS ABS: 4.5 10*3/uL (ref 1.7–7.7)
NEUTROS PCT: 68 %
PLATELETS: 186 10*3/uL (ref 150–400)
RBC: 4.22 MIL/uL (ref 4.22–5.81)
RDW: 17.4 % — ABNORMAL HIGH (ref 11.5–15.5)
WBC: 6.7 10*3/uL (ref 4.0–10.5)

## 2015-06-24 LAB — COMPREHENSIVE METABOLIC PANEL
ALBUMIN: 3.8 g/dL (ref 3.5–5.0)
ALK PHOS: 47 U/L (ref 38–126)
ALT: 13 U/L — AB (ref 17–63)
ANION GAP: 5 (ref 5–15)
AST: 17 U/L (ref 15–41)
BUN: 13 mg/dL (ref 6–20)
CHLORIDE: 105 mmol/L (ref 101–111)
CO2: 29 mmol/L (ref 22–32)
CREATININE: 1.12 mg/dL (ref 0.61–1.24)
Calcium: 9 mg/dL (ref 8.9–10.3)
GFR calc non Af Amer: >60 mL/min (ref >60)
GLUCOSE: 88 mg/dL (ref 65–99)
Potassium: 4.1 mmol/L (ref 3.5–5.1)
SODIUM: 139 mmol/L (ref 135–145)
Total Bilirubin: 0.5 mg/dL (ref 0.3–1.2)
Total Protein: 7.4 g/dL (ref 6.5–8.1)

## 2015-06-24 MED ORDER — DOXORUBICIN HCL CHEMO IV INJECTION 2 MG/ML
25.0000 mg/m2 | Freq: Once | INTRAVENOUS | Status: AC
  Administered 2015-06-24: 48 mg via INTRAVENOUS
  Filled 2015-06-24: qty 24

## 2015-06-24 MED ORDER — BLEOMYCIN SULFATE CHEMO INJECTION 30 UNIT
10.0000 [IU]/m2 | Freq: Once | INTRAMUSCULAR | Status: AC
Start: 2015-06-24 — End: 2015-06-24
  Administered 2015-06-24: 19 [IU] via INTRAVENOUS
  Filled 2015-06-24: qty 6.33

## 2015-06-24 MED ORDER — SODIUM CHLORIDE 0.9 % IJ SOLN: 10.0000 mL | INTRAMUSCULAR | Status: DC | PRN

## 2015-06-24 MED ORDER — PALONOSETRON HCL INJECTION 0.25 MG/5ML
0.2500 mg | Freq: Once | INTRAVENOUS | Status: AC
  Administered 2015-06-24: 0.25 mg via INTRAVENOUS

## 2015-06-24 MED ORDER — SODIUM CHLORIDE 0.9 % IV SOLN
Freq: Once | INTRAVENOUS | Status: AC
  Administered 2015-06-24: 11:00:00 via INTRAVENOUS

## 2015-06-24 MED ORDER — PALONOSETRON HCL INJECTION 0.25 MG/5ML
INTRAVENOUS | Status: AC
  Filled 2015-06-24: qty 5

## 2015-06-24 MED ORDER — HEPARIN SOD (PORK) LOCK FLUSH 100 UNIT/ML IV SOLN
500.0000 [IU] | Freq: Once | INTRAVENOUS | Status: AC | PRN
  Administered 2015-06-24: 500 [IU]

## 2015-06-24 MED ORDER — VINBLASTINE SULFATE CHEMO INJECTION 1 MG/ML
6.0000 mg/m2 | Freq: Once | INTRAVENOUS | Status: AC
  Administered 2015-06-24: 11.5 mg via INTRAVENOUS
  Filled 2015-06-24: qty 11.5

## 2015-06-24 MED ORDER — SODIUM CHLORIDE 0.9 % IV SOLN
Freq: Once | INTRAVENOUS | Status: AC
  Administered 2015-06-24: 11:00:00 via INTRAVENOUS
  Filled 2015-06-24: qty 5

## 2015-06-24 MED ORDER — SODIUM CHLORIDE 0.9 % IV SOLN
375.0000 mg/m2 | Freq: Once | INTRAVENOUS | Status: AC
  Administered 2015-06-24: 720 mg via INTRAVENOUS
  Filled 2015-06-24: qty 36

## 2015-06-24 NOTE — Patient Instructions (Signed)
[facility] Discharge Instructions for Patients Receiving Chemotherapy  Today you received the following chemotherapy agents: Adriamycin, Vinblastine, Bleomycin, and Dacarbazine.       If you develop nausea and vomiting, or diarrhea that is not controlled by your medication, call the clinic.  The clinic phone number is [phone]. Office hours are Monday-Friday 8:30am-5:00pm.  BELOW ARE SYMPTOMS THAT SHOULD BE REPORTED IMMEDIATELY:  *FEVER GREATER THAN 101.0 F  *CHILLS WITH OR WITHOUT FEVER  NAUSEA AND VOMITING THAT IS NOT CONTROLLED WITH YOUR NAUSEA MEDICATION  *UNUSUAL SHORTNESS OF BREATH  *UNUSUAL BRUISING OR BLEEDING  TENDERNESS IN MOUTH AND THROAT WITH OR WITHOUT PRESENCE OF ULCERS  *URINARY PROBLEMS  *BOWEL PROBLEMS  UNUSUAL RASH Items with * indicate a potential emergency and should be followed up as soon as possible. If you have an emergency after office hours please contact your primary care physician or go to the nearest emergency department.  Please call the clinic during office hours if you have any questions or concerns.   You may also contact the Patient Navigator at [phone] should you have any questions or need assistance in obtaining follow up care. _____________________________________________________________________ Have you asked about our STAR program?    STAR stands for Survivorship Training and Rehabilitation, and this is a nationally recognized cancer care program that focuses on survivorship and rehabilitation.  Cancer and cancer treatments may cause problems, such as, pain, making you feel tired and keeping you from doing the things that you need or want to do. Cancer rehabilitation can help. Our goal is to reduce these troubling effects and help you have the best quality of life possible.  You may receive a survey from a nurse that asks questions about your current state of health.  Based on the survey results, all eligible  patients will be referred to the [facility] program for an evaluation so we can better serve you! A frequently asked questions sheet is available upon request.

## 2015-06-24 NOTE — Progress Notes (Signed)
Patient tolerated chemotherapy well.  VSS post infusion.  No c/o ill effect.  Patient has return appointment list and understands that he is not to return for Neulasta this treatment cycle.

## 2015-06-24 NOTE — Patient Instructions (Signed)
[place] at [facility] Discharge Instructions  RECOMMENDATIONS MADE BY THE CONSULTANT AND ANY TEST RESULTS WILL BE SENT TO YOUR REFERRING PHYSICIAN.  Exam completed by Kirby Crigler PA today Continue treatment as planned No neulasta this time Return to see the doctor in 2 weeks Please call the clinic if you have any questions  Thank you for choosing [place] at [facility] to provide your oncology and hematology care.  To afford each patient quality time with our provider, please arrive at least 15 minutes before your scheduled appointment time.    You need to re-schedule your appointment should you arrive 10 or more minutes late.  We strive to give you quality time with our providers, and arriving late affects you and other patients whose appointments are after yours.  Also, if you no show three or more times for appointments you may be dismissed from the clinic at the providers discretion.     Again, thank you for choosing [facility].  Our hope is that these requests will decrease the amount of time that you wait before being seen by our physicians.       _____________________________________________________________  Should you have questions after your visit to [facility], please contact our office at [phone] between the hours of 8:30 a.m. and 4:30 p.m.  Voicemails left after 4:30 p.m. will not be returned until the following business day.  For prescription refill requests, have your pharmacy contact our office.

## 2015-07-08 ENCOUNTER — Encounter (HOSPITAL_BASED_OUTPATIENT_CLINIC_OR_DEPARTMENT_OTHER): Payer: Medicaid Other | Admitting: Hematology & Oncology

## 2015-07-08 ENCOUNTER — Encounter: Payer: Self-pay | Admitting: *Deleted

## 2015-07-08 ENCOUNTER — Encounter (HOSPITAL_COMMUNITY): Payer: Medicaid Other | Attending: Hematology & Oncology

## 2015-07-08 VITALS — BP 106/68 | HR 56 | Temp 98.5°F | Resp 16 | Wt 174.9 lb

## 2015-07-08 DIAGNOSIS — Z5111 Encounter for antineoplastic chemotherapy: Secondary | ICD-10-CM

## 2015-07-08 DIAGNOSIS — C8115 Nodular sclerosis classical Hodgkin lymphoma, lymph nodes of inguinal region and lower limb: Secondary | ICD-10-CM | POA: Diagnosis not present

## 2015-07-08 DIAGNOSIS — D890 Polyclonal hypergammaglobulinemia: Secondary | ICD-10-CM

## 2015-07-08 DIAGNOSIS — E8809 Other disorders of plasma-protein metabolism, not elsewhere classified: Secondary | ICD-10-CM | POA: Diagnosis not present

## 2015-07-08 DIAGNOSIS — C8100 Nodular lymphocyte predominant Hodgkin lymphoma, unspecified site: Secondary | ICD-10-CM

## 2015-07-08 DIAGNOSIS — C8105 Nodular lymphocyte predominant Hodgkin lymphoma, lymph nodes of inguinal region and lower limb: Secondary | ICD-10-CM

## 2015-07-08 DIAGNOSIS — C8190 Hodgkin lymphoma, unspecified, unspecified site: Secondary | ICD-10-CM

## 2015-07-08 LAB — CBC WITH DIFFERENTIAL/PLATELET
Basophils Absolute: 0 10*3/uL (ref 0.0–0.1)
Basophils Relative: 1 %
EOS ABS: 0.1 10*3/uL (ref 0.0–0.7)
EOS PCT: 4 %
HCT: 35.9 % — ABNORMAL LOW (ref 39.0–52.0)
Hemoglobin: 11.5 g/dL — ABNORMAL LOW (ref 13.0–17.0)
LYMPHS ABS: 1 10*3/uL (ref 0.7–4.0)
Lymphocytes Relative: 33 %
MCH: 28.1 pg (ref 26.0–34.0)
MCHC: 32 g/dL (ref 30.0–36.0)
MCV: 87.8 fL (ref 78.0–100.0)
MONO ABS: 0.4 10*3/uL (ref 0.1–1.0)
MONOS PCT: 14 %
Neutro Abs: 1.4 10*3/uL — ABNORMAL LOW (ref 1.7–7.7)
Neutrophils Relative %: 48 %
PLATELETS: 273 10*3/uL (ref 150–400)
RBC: 4.09 MIL/uL — AB (ref 4.22–5.81)
RDW: 16.6 % — AB (ref 11.5–15.5)
WBC: 3 10*3/uL — ABNORMAL LOW (ref 4.0–10.5)

## 2015-07-08 LAB — COMPREHENSIVE METABOLIC PANEL
ALT: 15 U/L — ABNORMAL LOW (ref 17–63)
ANION GAP: 4 — AB (ref 5–15)
AST: 20 U/L (ref 15–41)
Albumin: 3.6 g/dL (ref 3.5–5.0)
Alkaline Phosphatase: 37 U/L — ABNORMAL LOW (ref 38–126)
BUN: 12 mg/dL (ref 6–20)
CHLORIDE: 106 mmol/L (ref 101–111)
CO2: 31 mmol/L (ref 22–32)
Calcium: 9 mg/dL (ref 8.9–10.3)
Creatinine, Ser: 1.05 mg/dL (ref 0.61–1.24)
Glucose, Bld: 117 mg/dL — ABNORMAL HIGH (ref 65–99)
Potassium: 4.1 mmol/L (ref 3.5–5.1)
SODIUM: 141 mmol/L (ref 135–145)
Total Bilirubin: 0.3 mg/dL (ref 0.3–1.2)
Total Protein: 7.2 g/dL (ref 6.5–8.1)

## 2015-07-08 MED ORDER — SODIUM CHLORIDE 0.9 % IV SOLN
375.0000 mg/m2 | Freq: Once | INTRAVENOUS | Status: AC
  Administered 2015-07-08: 720 mg via INTRAVENOUS
  Filled 2015-07-08: qty 36

## 2015-07-08 MED ORDER — SODIUM CHLORIDE 0.9 % IV SOLN
Freq: Once | INTRAVENOUS | Status: AC
  Administered 2015-07-08: 10:00:00 via INTRAVENOUS

## 2015-07-08 MED ORDER — SODIUM CHLORIDE 0.9 % IJ SOLN: 10.0000 mL | INTRAMUSCULAR | Status: DC | PRN

## 2015-07-08 MED ORDER — HEPARIN SOD (PORK) LOCK FLUSH 100 UNIT/ML IV SOLN
500.0000 [IU] | Freq: Once | INTRAVENOUS | Status: AC | PRN
  Administered 2015-07-08: 500 [IU]
  Filled 2015-07-08: qty 5

## 2015-07-08 MED ORDER — SODIUM CHLORIDE 0.9 % IV SOLN
10.0000 [IU]/m2 | Freq: Once | INTRAVENOUS | Status: AC
  Administered 2015-07-08: 19 [IU] via INTRAVENOUS
  Filled 2015-07-08: qty 6.33

## 2015-07-08 MED ORDER — DOXORUBICIN HCL CHEMO IV INJECTION 2 MG/ML
25.0000 mg/m2 | Freq: Once | INTRAVENOUS | Status: AC
  Administered 2015-07-08: 48 mg via INTRAVENOUS
  Filled 2015-07-08: qty 24

## 2015-07-08 MED ORDER — VINBLASTINE SULFATE CHEMO INJECTION 1 MG/ML
6.0000 mg/m2 | Freq: Once | INTRAVENOUS | Status: AC
  Administered 2015-07-08: 11.5 mg via INTRAVENOUS
  Filled 2015-07-08: qty 11.5

## 2015-07-08 MED ORDER — SODIUM CHLORIDE 0.9 % IV SOLN
Freq: Once | INTRAVENOUS | Status: AC
  Administered 2015-07-08: 10:00:00 via INTRAVENOUS
  Filled 2015-07-08: qty 5

## 2015-07-08 MED ORDER — PALONOSETRON HCL INJECTION 0.25 MG/5ML
0.2500 mg | Freq: Once | INTRAVENOUS | Status: AC
  Administered 2015-07-08: 0.25 mg via INTRAVENOUS

## 2015-07-08 MED ORDER — PALONOSETRON HCL INJECTION 0.25 MG/5ML
INTRAVENOUS | Status: AC
  Filled 2015-07-08: qty 5

## 2015-07-08 NOTE — Progress Notes (Signed)
Patient tolerated infusion well.  VSS post infusion.  Patient not to get Neulasta this treatment per Dr.  Muse.

## 2015-07-08 NOTE — Progress Notes (Signed)
[place] Clinical Social Work  Clinical Social Work was referred by [place] rounding for assessment of psychosocial needs due to previous concerns with depression. Clinical Social Worker met with patient at [facility] during his last chemo to offer support and assess for needs.  Pt reports he is doing much better and is thrilled to have made it through treatment. He feels things are going better in his personal life and is trying to resolve some previous relationship concerns that have caused him distress in the past. Pt denied other concerns and is glad to be finished with treatment.   Clinical Social Work interventions: Supportive listening  [place], [place] Tuesdays   [phone]

## 2015-07-08 NOTE — Progress Notes (Signed)
[place] [facility] [place] NOTE  No care team member to display  CHIEF COMPLAINTS/PURPOSE OF CONSULTATION:  Hodgkin lymphoma Staging form: Lymphoid Neoplasms, AJCC 6th Edition Clinical stage from 03/04/2015: Stage III - Unsigned Staging comments:  Questionable involvement of lingula on PET imaging. BMBX negative. PET with mild hyper metabolism with borderline enlarged bilateral axillar and mediastinal nodes. Bilateral inguinal nodes.   NOTE: patient has lytic lesion in the pelvis that are NOT PET avid. Not biopsied  L inquinal LN biopsy on 01/13/2015 with final pathology showing Classical Hodgkin Lymphoma, nodular sclerosing type Bone marrow biopsy 01/20/2015 without evidence of malignancy    Hodgkin lymphoma ([place])   01/13/2015 Initial Biopsy L inguinal LN biopsy on 01/13/2015 with Classical Hodgkin Lymphoma, nodular sclerosing type   01/20/2015 Bone Marrow Biopsy normal BMBX, mild polyclonal plasmacytosis   01/28/2015 Imaging PET/CT Mild hyper metabolism is associated with the borderline enlarged bilateral axillary and mediastinal lymph nodes. There is more intense FDG uptake associated with bilateral inguinal lymph nodes. focal area of peripheral consolidation in the lingula.   01/29/2015 Imaging MUGA with EF 69%   01/29/2015 Imaging 1.spirometry is normal .no change with bronchodilator. lung volumes are normal. airway resistance somewhat high. DLCO normal decreased MIP/MEP may indicate weakness of the bellows function of the lung.    02/04/2015 -  Chemotherapy ABVD   03/28/2015 PET scan No adenopathy or abnormal hypermetabolic activity along the previous axillary, mediastinal, and inguinal lymph node chains. Deauville 1.   03/31/2015 Imaging MUGA- Normal LEFT ventricular ejection fraction of 70%, not significantly changed from the 69% calculated on the previous study of 01/29/2015.   05/30/2015 Imaging MUGA- No significant change in ejection fraction equal 73%     HISTORY OF  PRESENTING ILLNESS:  Robert Acevedo 44 y.o. male is here for additional follow-up of Hodgkin Lymphoma.   He is present alone today and likes to be called Robert Acevedo He denies nausea, vomiting, diarrhea or constipation. He denies fever or chills.  He has absolutely no complaints today and notes that he feels very well. He is active, he exercises. He continues to smoke. He is here today for his last treatment with ABVD.  He has not received a flu shot this year. He is agreeable to one. He is here today to also address how to proceed with ongoing follow-up regarding his lymphoma post treatment, his abnormal eye exam.   MEDICAL HISTORY:  Past Medical History  Diagnosis Date  . Bronchitis   . Lymphadenopathy, inguinal     left  . Hodgkin's lymphoma ([place])     SURGICAL HISTORY: Past Surgical History  Procedure Laterality Date  . Wrist surgery      right  . Knee surgery    . Incision and drainage peritonsillar abscess    . Lymph node biopsy Left 01/13/2015    Procedure: LEFT INGUINAL LYMPH NODE BIOPSY;  Surgeon: Aviva Signs Md, MD;  Location: [facility];  Service: General;  Laterality: Left;  . Bone marrow biopsy Left 01/20/15  . Bone marrow aspiration Left 01/20/15  . Portacath placement Left 01/31/2015    Procedure: INSERTION PORT-A-CATH;  Surgeon: Aviva Signs Md, MD;  Location: [facility];  Service: General;  Laterality: Left;    SOCIAL HISTORY: Social History   Social History  . Marital Status: Single    Spouse Name: N/A  . Number of Children: N/A  . Years of Education: N/A   Occupational History  . Not on file.  Social History Main Topics  . Smoking status: Current Every Day Smoker -- 0.50 packs/day for 2 years    Types: Cigarettes  . Smokeless tobacco: Never Used  . Alcohol Use: Yes     Comment: occasional  . Drug Use: Yes    Special: Marijuana     Comment: on occasion - last used 01/26/15  . Sexual Activity: Yes    Birth Control/ Protection: None   Other Topics Concern    . Not on file   Social History Narrative  Pt is a smoker starting 2008, pack of cigarettes a week. Infrequent drinking alcohol, maybe one or two once a month. Denies drug use other than daily marijuana. In jail for 18 months.  Has 3 kids, 40 yo, 48 yo, and 57 yo. Has 1 grandchild. He has a car.   FAMILY HISTORY: Family History  Problem Relation Age of Onset  . Seizures Mother   . Diabetes Father   Father is 70 yo diabetic. Mother is 54 yo. Both still living. 1 brother, 4 sisters. One sister has breast cancer, diagnosed 1996 in her 67s. Another sister has severe depression. He has been with his girlfriend 9-10 years. He lives with his girlfriend and his father. He hasn't told his father about his diagnosis yet. One sister lives in [place] and she is aware of his diagnosis. He told two of his childhood friends about his diagnosis.   ALLERGIES:  is allergic to clindamycin/lincomycin.  MEDICATIONS:  Current Outpatient Prescriptions  Medication Sig Dispense Refill  . amitriptyline (ELAVIL) 25 MG tablet Take one tablet at bedtime for 5 days then increase to two tablets thereafter (Patient taking differently: Take 50 mg by mouth at bedtime. Take one tablet at bedtime for 5 days then increase to two tablets thereafter) 60 tablet 3  . azithromycin (ZITHROMAX) 250 MG tablet Take 2 tablets the first day then 1 a day til finished 11 each 0  . bleomycin (BLEOCIN) 15 UNITS injection Inject into the vein once. To be administered Day 1 & Day 15 every 28 days. To begin 02/04/15.    . chlorproMAZINE (THORAZINE) 25 MG tablet Take 1 tablet (25 mg total) by mouth 3 (three) times daily. (Patient not taking: Reported on 06/09/2015) 15 tablet 0  . citalopram (CELEXA) 20 MG tablet Take 1 tablet daily x 7 days. Then increase to 2 tablets daily. 49 tablet 0  . dacarbazine (DTIC) 200 MG chemo injection Inject into the vein once. To be administered Day 1 & Day 15 every 28 days. To begin 02/04/15.    Marland Kitchen DOXOrubicin HCl  (ADRIAMYCIN IV) Inject into the vein. To be administered Day 1 & Day 15 every 28 days. To begin 02/04/15.    . lidocaine-prilocaine (EMLA) cream Apply a quarter size amount to port site 1 hour prior to chemo. Do not rub in. Cover with plastic wrap. 30 g 3  . LORazepam (ATIVAN) 0.5 MG tablet Take 1 tablet (0.5 mg total) by mouth every 8 (eight) hours as needed for anxiety. 30 tablet 1  . omeprazole (PRILOSEC) 40 MG capsule Take 1 capsule (40 mg total) by mouth daily. 30 capsule 3  . ondansetron (ZOFRAN) 8 MG tablet Take 1 tablet (8 mg total) by mouth every 8 (eight) hours as needed for nausea or vomiting. (Patient not taking: Reported on 08/01/2015) 60 tablet 1  . oxyCODONE-acetaminophen (PERCOCET/ROXICET) 5-325 MG tablet Take 1-2 tablets by mouth every 6 (six) hours as needed for severe pain. Take for Neulasta bone  pain 45 tablet 0  . penicillin v potassium (VEETID) 500 MG tablet Take one tablet by mouth 3 times daily (Patient not taking: Reported on 06/09/2015) 21 tablet 0  . potassium chloride SA (K-DUR,KLOR-CON) 10 MEQ tablet Take 1 tablet (10 mEq total) by mouth 2 (two) times daily. (Patient not taking: Reported on 08/01/2015) 60 tablet 0  . prochlorperazine (COMPAZINE) 10 MG tablet Take 1 tablet (10 mg total) by mouth every 6 (six) hours as needed for nausea or vomiting. (Patient not taking: Reported on 05/27/2015) 60 tablet 1  . vinBLAStine (VELBAN) 10 MG injection Inject into the vein. To be administered Day 1 & Day 15 every 28 days. To begin 02/04/15.     No current facility-administered medications for this visit.    Review of Systems  Constitutional: Negative for weight loss and diaphoresis. Negative for fever, chills and malaise/fatigue.  HENT: Negative for congestion, ear discharge, ear pain, hearing loss, nosebleeds, sore throat and tinnitus.   Eyes: Negative for blurred vision. Negative for double vision, photophobia, pain, discharge and redness.  Respiratory: Negative.  Negative for  stridor.   Cardiovascular: Negative.   Gastrointestinal: Negative for heartburn, nausea, vomiting, diarrhea, blood in stool and melena. Genitourinary: Negative for dysuria, frequency, hematuria and flank pain.  Musculoskeletal: Negative.   Skin: Negative.   Neurological: Negative for dizziness, tingling, tremors, sensory change, speech change, focal weakness, seizures, loss of consciousness and weakness.  Endo/Heme/Allergies: Negative.   Psychiatric/Behavioral: Positive for substance abuse. Negative for depression, suicidal ideas, hallucinations and memory loss.  The patient is not nervous/anxious.    14 point ROS was done and is otherwise as detailed above or in HPI   PHYSICAL EXAMINATION: ECOG PERFORMANCE STATUS: 1 - Symptomatic but completely ambulatory Vitals - 1 value per visit 63/03/7563  SYSTOLIC 332  DIASTOLIC 68  Pulse 56  Temperature 98.5  Respirations 16  Weight (lb) 174.9  Height   BMI 24.93   Physical Exam  Constitutional: He is oriented to person, place, and time and well-developed, well-nourished, and in no distress.  HENT:  Head: Normocephalic and atraumatic.  Nose: Nose normal.  Mouth/Throat: Oropharynx is clear and moist. No oropharyngeal exudate.  Eyes: Conjunctivae and EOM are normal. Pupils are equal, round, and reactive to light. Right eye exhibits no discharge. Left eye exhibits no discharge. No scleral icterus.  Neck: Normal range of motion. Neck supple. No tracheal deviation present. No thyromegaly present.  Cardiovascular: Normal rate, regular rhythm and normal heart sounds.  Exam reveals no gallop and no friction rub.  No murmur heard. Pulmonary/Chest: Effort normal and breath sounds normal. He has no wheezes. He has no rales. Port a cath accessed L chest Abdominal: Soft. Bowel sounds are normal. He exhibits no distension and no mass. There is no tenderness. There is no rebound and no guarding.  Genitourinary:  deferred Musculoskeletal: Normal range of  motion. He exhibits no edema.   Neurological: He is alert and oriented to person, place, and time. He has normal reflexes. No cranial nerve deficit. Gait normal. Coordination normal.  Skin: Skin is warm and dry. No rash noted.  Psychiatric: Mood, memory, affect and judgment normal.  Nursing note and vitals reviewed.   LABORATORY DATA:  I have reviewed the data as listed. Lab Results  Component Value Date   WBC 3.0* 07/08/2015   HGB 11.5* 07/08/2015   HCT 35.9* 07/08/2015   MCV 87.8 07/08/2015   PLT 273 07/08/2015   CMP     Component Value Date/Time  NA 141 07/08/2015 0916   K 4.1 07/08/2015 0916   CL 106 07/08/2015 0916   CO2 31 07/08/2015 0916   GLUCOSE 117* 07/08/2015 0916   BUN 12 07/08/2015 0916   CREATININE 1.05 07/08/2015 0916   CALCIUM 9.0 07/08/2015 0916   PROT 7.2 07/08/2015 0916   ALBUMIN 3.6 07/08/2015 0916   AST 20 07/08/2015 0916   ALT 15* 07/08/2015 0916   ALKPHOS 37* 07/08/2015 0916   BILITOT 0.3 07/08/2015 0916   GFRNONAA >60 07/08/2015 0916   GFRAA >60 07/08/2015 0916     PATHOLOGY:  BMBXIAGNOSIS Diagnosis Bone Marrow, Aspirate,Biopsy, and Clot, left iliac - NORMOCELLULAR BONE MARROW FOR AGE WITH TRILINEAGE HEMATOPOIESIS. - MILD POLYCLONAL PLASMACYTOSIS - NO TUMOR IDENTIFIED. PERIPHERAL BLOOD: - NORMOCYTIC-NORMOCHROMIC ANEMIA. Diagnosis Note There is no morphologic evidence of involvement by Hodgkin lymphoma in this material. Clinical correlation is recommended. (BNS:ecj 01/22/2015) Susanne Greenhouse MD Pathologist, Electronic Signature  LYMPH NODE BIOPSY Diagnosis Lymph node for lymphoma, left inguinal - CLASSICAL HODGKIN LYMPHOMA, NODULAR SCLEROSIS TYPE. - SEE ONCOLOGY TABLE. Microscopic Comment LYMPHOMA Histologic type: Classical Hodgkin lymphoma, nodular sclerosis type. Grade (if applicable): N/A. Flow cytometry: Not performed. Immunohistochemical stains: CD45, CD20, CD79a, CD3, CD30, and CD15. Touch preps/imprints: Mixed lymphoid  population with scattered large cells with bi-/multi-nucleation and prominent nucleoli. Comments: Sections of lymph nodes reveal effacement of the architecture by a nodular lymphoreticular proliferation. There are abundant large cells with bi-/multi-nucleation and prominent nucleoli, consistent with Hodgkin Reed-Sternberg (HRS) cells. These occur both scattered and in large loose clusters. There are lacunar variants and mummified cells. Focally, there are broad bands of fibrosis creating a more nodular appearance. In these areas the background reveals small lymphocytes and plasma cells with scattered eosinophils and neutrophils, while less fibrotic areas have a more lymphocytic background. The smaller node is only partially involved with areas of residual reactive lymph node. Immunohistochemistry reveals the HRS cells are positive for CD30 and CD15. They are negative for CD20, CD79a, and CD45. CD20 and CD79 highlight residual B-cell areas. CD3 highlights abundant T-cells. The overall findings are consistent with classical Hodgkin lymphoma, nodular sclerosis type. The case was called to Drs. Jenkins and [place] on 01/15/2015. Vicente Males MD Pathologist, Electronic Signature (Case signed 01/15/2015) Specimen Gross and Clinical Information   RADIOGRAPHIC STUDIES:  CLINICAL DATA: Subsequent treatment strategy for Hodgkin's lymphoma.  EXAM: NUCLEAR MEDICINE PET SKULL BASE TO THIGH  TECHNIQUE: 8.0 mCi F-18 FDG was injected intravenously. Full-ring PET imaging was performed from the skull base to thigh after the radiotracer. CT data was obtained and used for attenuation correction and anatomic localization.  FASTING BLOOD GLUCOSE: Value: 87 mg/dl  COMPARISON: Multiple exams, including 01/28/2015  FINDINGS: NECK  Asymmetry along the tonsillar pillars, left measuring 5.3 and right 3.8, probably incidental, not appreciably changed from prior.  Incidentally there is periapical  lucency associated with the more lateral right maxillary premolar along with a considerable dental cavity. Other right molar dental cavities are also present.  CHEST  The prior axillary lymph nodes are no longer enlarged and no longer hypermetabolic. There are only tiny residual normal sized nodes along the axillary and subpectoral chains currently. No pathologic mediastinal adenopathy. For Deauville documentation purposes, mediastinal background vascular activity is 1.8 and representative hepatic activity is 2.2 in standard uptake value. The remaining tiny left axillary lymph nodes have a maximum standard uptake value of approximately 0.9, not above background.  ABDOMEN/PELVIS  No abnormal hypermetabolic activity within the liver, pancreas, adrenal glands, or spleen. No hypermetabolic  lymph nodes in the abdomen or pelvis. Specifically, the hypermetabolic inguinal lymph nodes have resolved.  SKELETON  Diffuse increased activity throughout the skeleton. Lucent lesions observed in the pelvis, once again not hypermetabolic.  IMPRESSION: 1. No adenopathy or abnormal hypermetabolic activity along the previous axillary, mediastinal, and inguinal lymph node chains. Deauville 1. 2. Asymmetry along the tonsillar pillars, left greater than right, probably incidental/physiologic. 3. Diffuse increased activity throughout the skeleton, likely from marrow stimulation therapy. 4. Right maxillary tooth decay, including a periapical lucency involving the lateral right maxillary premolar. Consider dental referral.   Electronically Signed  By: Van Clines M.D.  On: 03/28/2015 13:24 I have personally reviewed the radiological images as listed and agreed with the findings in the report.  CLINICAL DATA: Lymphoma cancer. Evaluate cardiac function in relation to chemotherapy.  EXAM: NUCLEAR MEDICINE CARDIAC BLOOD POOL IMAGING (MUGA)  TECHNIQUE: Cardiac multi-gated  acquisition was performed at rest following intravenous injection of Tc-31mlabeled red blood cells.  RADIOPHARMACEUTICALS: 25.6 mCi Tc-982mDP in-vitro labeled red blood cells IV  COMPARISON: 03/31/2015  FINDINGS: No focal wall motion abnormality of the left ventricle.  Calculated left ventricular ejection fraction equals 73%. This compares to ejection fraction of 70% on 03/31/2015  IMPRESSION: No significant change in ejection fraction equal 73%   Electronically Signed  By: StSuzy Bouchard.D.  On: 05/30/2015 14:59   ASSESSMENT & PLAN:  Hodgkin Lymphoma, Nodular sclerosing type, Stage III-2A (? involvement in lingula) Abnormal CT of the pelvis with lytic bone disease, not PET avid Polyclonal gammopathy Elevated ESR IPS Score: 5 year freedom from progression and OS of 2; 80% FFP and 91% OS Eye EXAM on 01/29/2015 with possible lymphoma lesion in retina OD (Dr. HaIona HansenPET/CT after 2 cycles Deauville 1  Plan is for continuation of ABVD in accordance with NCCN guidelines. He will complete a total of 6 cycles today.  I am ordering repeat imaging with follow-up with me afterwards.    I advised him that at his next follow-up I will refer him back to opthalmology for an eye exam.  Based upon his PET/CT results additional recommendations will be made regarding the findings in the pelvic bone.   He will receive the flu vaccination today.  All questions were answered. The patient knows to call the clinic with any problems, questions or concerns.   This note was electronically signed.    This document serves as a record of services personally performed by ShAncil LinseyMD. It was created on her behalf by ElArlyce Harmana trained medical scribe. The creation of this record is based on the scribe's personal observations and the provider's statements to them. This document has been checked and approved by the attending provider.  I have reviewed the above  documentation for accuracy and completeness, and I agree with the above.  ShKelby FamPeWhitney MuseMD

## 2015-07-08 NOTE — Patient Instructions (Signed)
[facility] Discharge Instructions for Patients Receiving Chemotherapy  Today you received the following chemotherapy agents: Adriamycin, Velban, Bleocin, DTIC. Today was your last treatment, please follow up as scheduled.     If you develop nausea and vomiting, or diarrhea that is not controlled by your medication, call the clinic.  The clinic phone number is [phone]. Office hours are Monday-Friday 8:30am-5:00pm.  BELOW ARE SYMPTOMS THAT SHOULD BE REPORTED IMMEDIATELY:  *FEVER GREATER THAN 101.0 F  *CHILLS WITH OR WITHOUT FEVER  NAUSEA AND VOMITING THAT IS NOT CONTROLLED WITH YOUR NAUSEA MEDICATION  *UNUSUAL SHORTNESS OF BREATH  *UNUSUAL BRUISING OR BLEEDING  TENDERNESS IN MOUTH AND THROAT WITH OR WITHOUT PRESENCE OF ULCERS  *URINARY PROBLEMS  *BOWEL PROBLEMS  UNUSUAL RASH Items with * indicate a potential emergency and should be followed up as soon as possible. If you have an emergency after office hours please contact your primary care physician or go to the nearest emergency department.  Please call the clinic during office hours if you have any questions or concerns.   You may also contact the Patient Navigator at [phone] should you have any questions or need assistance in obtaining follow up care. _____________________________________________________________________ Have you asked about our STAR program?    STAR stands for Survivorship Training and Rehabilitation, and this is a nationally recognized cancer care program that focuses on survivorship and rehabilitation.  Cancer and cancer treatments may cause problems, such as, pain, making you feel tired and keeping you from doing the things that you need or want to do. Cancer rehabilitation can help. Our goal is to reduce these troubling effects and help you have the best quality of life possible.  You may receive a survey from a nurse that asks questions about your current state of health.   Based on the survey results, all eligible patients will be referred to the [facility] program for an evaluation so we can better serve you! A frequently asked questions sheet is available upon request.

## 2015-07-08 NOTE — Patient Instructions (Signed)
[place] at [facility] Discharge Instructions  RECOMMENDATIONS MADE BY THE CONSULTANT AND ANY TEST RESULTS WILL BE SENT TO YOUR REFERRING PHYSICIAN.  Exam and discussion by Dr. Whitney Muse Today is your last treatment Will do PET scan on 07/29/15 at 9:30am at [facility].  Nothing to eat or drink 6 hours prior.  Nothing with sugar in it, no gum, hard candy, etc.  Follow-up in 1 months. After PET.  Thank you for choosing [place] at [facility] to provide your oncology and hematology care.  To afford each patient quality time with our provider, please arrive at least 15 minutes before your scheduled appointment time.    You need to re-schedule your appointment should you arrive 10 or more minutes late.  We strive to give you quality time with our providers, and arriving late affects you and other patients whose appointments are after yours.  Also, if you no show three or more times for appointments you may be dismissed from the clinic at the providers discretion.     Again, thank you for choosing [facility].  Our hope is that these requests will decrease the amount of time that you wait before being seen by our physicians.       _____________________________________________________________  Should you have questions after your visit to [facility], please contact our office at [phone] between the hours of 8:30 a.m. and 4:30 p.m.  Voicemails left after 4:30 p.m. will not be returned until the following business day.  For prescription refill requests, have your pharmacy contact our office.

## 2015-07-08 NOTE — Progress Notes (Signed)
Patient refused flu vaccine today.  Patient advised that the MD wishes for all chemotherapy patients to receive the flu vaccine and the importance of it with his immunosuppressant state.  He verbalized understanding.

## 2015-07-09 ENCOUNTER — Encounter (HOSPITAL_COMMUNITY): Payer: Self-pay | Admitting: Hematology & Oncology

## 2015-07-22 ENCOUNTER — Ambulatory Visit (HOSPITAL_COMMUNITY): Payer: Medicaid Other | Admitting: Hematology & Oncology

## 2015-07-29 ENCOUNTER — Encounter (HOSPITAL_COMMUNITY)
Admission: RE | Admit: 2015-07-29 | Discharge: 2015-07-29 | Disposition: A | Discharge status: Home / Self Care | Payer: Medicaid Other | Source: Ambulatory Visit | Attending: Hematology & Oncology | Admitting: Hematology & Oncology

## 2015-07-29 DIAGNOSIS — C8190 Hodgkin lymphoma, unspecified, unspecified site: Secondary | ICD-10-CM | POA: Diagnosis present

## 2015-07-29 DIAGNOSIS — C8100 Nodular lymphocyte predominant Hodgkin lymphoma, unspecified site: Secondary | ICD-10-CM

## 2015-07-29 LAB — GLUCOSE, CAPILLARY: Glucose-Capillary: 84 mg/dL (ref 65–99)

## 2015-07-29 MED ORDER — FLUDEOXYGLUCOSE F - 18 (FDG) INJECTION
11.1000 | Freq: Once | INTRAVENOUS | Status: AC | PRN
  Administered 2015-07-29: 11.1 via INTRAVENOUS

## 2015-07-30 NOTE — Progress Notes (Signed)
[place] [facility] [place] NOTE  No care team member to display  CHIEF COMPLAINTS/PURPOSE OF CONSULTATION:  Hodgkin lymphoma Staging form: Lymphoid Neoplasms, AJCC 6th Edition Clinical stage from 03/04/2015: Stage III - Unsigned Staging comments:  Questionable involvement of lingula on PET imaging. BMBX negative. PET with mild hyper metabolism with borderline enlarged bilateral axillar and mediastinal nodes. Bilateral inguinal nodes.   NOTE: patient has lytic lesion in the pelvis that are NOT PET avid. Not biopsied  L inquinal LN biopsy on 01/13/2015 with final pathology showing Classical Hodgkin Lymphoma, nodular sclerosing type Bone marrow biopsy 01/20/2015 without evidence of malignancy    Hodgkin lymphoma ([place])   01/13/2015 Initial Biopsy L inguinal LN biopsy on 01/13/2015 with Classical Hodgkin Lymphoma, nodular sclerosing type   01/20/2015 Bone Marrow Biopsy normal BMBX, mild polyclonal plasmacytosis   01/28/2015 Imaging PET/CT Mild hyper metabolism is associated with the borderline enlarged bilateral axillary and mediastinal lymph nodes. There is more intense FDG uptake associated with bilateral inguinal lymph nodes. focal area of peripheral consolidation in the lingula.   01/29/2015 Imaging MUGA with EF 69%   01/29/2015 Imaging 1.spirometry is normal .no change with bronchodilator. lung volumes are normal. airway resistance somewhat high. DLCO normal decreased MIP/MEP may indicate weakness of the bellows function of the lung.    02/04/2015 -  Chemotherapy ABVD   03/28/2015 PET scan No adenopathy or abnormal hypermetabolic activity along the previous axillary, mediastinal, and inguinal lymph node chains. Deauville 1.   03/31/2015 Imaging MUGA- Normal LEFT ventricular ejection fraction of 70%, not significantly changed from the 69% calculated on the previous study of 01/29/2015.   05/30/2015 Imaging MUGA- No significant change in ejection fraction equal 73%     HISTORY OF  PRESENTING ILLNESS:  Trinten K Cozort 44 y.o. male is here for additional follow-up of Hodgkin Lymphoma.   He is present alone today and likes to be called [name] He denies nausea, vomiting, diarrhea or constipation. He denies fever or chills.  He notes he has developed a URI. It has been present for several days.  He notes it is mostly sinus congestion and sneezing.  He is here to follow-up with his Hodgkin Lymphoma and to review his recent PET imaging.   MEDICAL HISTORY:  Past Medical History  Diagnosis Date  . Bronchitis   . Lymphadenopathy, inguinal     left  . Hodgkin's lymphoma ([place])     SURGICAL HISTORY: Past Surgical History  Procedure Laterality Date  . Wrist surgery      right  . Knee surgery    . Incision and drainage peritonsillar abscess    . Lymph node biopsy Left 01/13/2015    Procedure: LEFT INGUINAL LYMPH NODE BIOPSY;  Surgeon: Aviva Signs Md, MD;  Location: [facility];  Service: General;  Laterality: Left;  . Bone marrow biopsy Left 01/20/15  . Bone marrow aspiration Left 01/20/15  . Portacath placement Left 01/31/2015    Procedure: INSERTION PORT-A-CATH;  Surgeon: Aviva Signs Md, MD;  Location: [facility];  Service: General;  Laterality: Left;    SOCIAL HISTORY: Social History   Social History  . Marital Status: Single    Spouse Name: N/A  . Number of Children: N/A  . Years of Education: N/A   Occupational History  . Not on file.   Social History Main Topics  . Smoking status: Current Every Day Smoker -- 0.50 packs/day for 2 years    Types: Cigarettes  . Smokeless tobacco: Never  Used  . Alcohol Use: Yes     Comment: occasional  . Drug Use: Yes    Special: Marijuana     Comment: on occasion - last used 01/26/15  . Sexual Activity: Yes    Birth Control/ Protection: None   Other Topics Concern  . Not on file   Social History Narrative  Pt is a smoker starting 2008, pack of cigarettes a week. Infrequent drinking alcohol, maybe one or two once a month.  Denies drug use other than daily marijuana. In jail for 18 months.  Has 3 kids, 8 yo, 65 yo, and 23 yo. Has 1 grandchild. He has a car.   FAMILY HISTORY: Family History  Problem Relation Age of Onset  . Seizures Mother   . Diabetes Father   Father is 75 yo diabetic. Mother is 27 yo. Both still living. 1 brother, 4 sisters. One sister has breast cancer, diagnosed 1996 in her 78s. Another sister has severe depression. He has been with his girlfriend 9-10 years. He lives with his girlfriend and his father. He hasn't told his father about his diagnosis yet. One sister lives in [place] and she is aware of his diagnosis. He told two of his childhood friends about his diagnosis.   ALLERGIES:  is allergic to clindamycin/lincomycin.  MEDICATIONS:  Current Outpatient Prescriptions  Medication Sig Dispense Refill  . amitriptyline (ELAVIL) 25 MG tablet Take one tablet at bedtime for 5 days then increase to two tablets thereafter (Patient taking differently: Take 50 mg by mouth at bedtime. Take one tablet at bedtime for 5 days then increase to two tablets thereafter) 60 tablet 3  . citalopram (CELEXA) 20 MG tablet Take 1 tablet daily x 7 days. Then increase to 2 tablets daily. 49 tablet 0  . lidocaine-prilocaine (EMLA) cream Apply a quarter size amount to port site 1 hour prior to chemo. Do not rub in. Cover with plastic wrap. 30 g 3  . LORazepam (ATIVAN) 0.5 MG tablet Take 1 tablet (0.5 mg total) by mouth every 8 (eight) hours as needed for anxiety. 30 tablet 1  . omeprazole (PRILOSEC) 40 MG capsule Take 1 capsule (40 mg total) by mouth daily. 30 capsule 3  . oxyCODONE-acetaminophen (PERCOCET/ROXICET) 5-325 MG tablet Take 1-2 tablets by mouth every 6 (six) hours as needed for severe pain. Take for Neulasta bone pain 45 tablet 0  . azithromycin (ZITHROMAX) 250 MG tablet Take 2 tablets the first day then 1 a day til finished 11 each 0  . bleomycin (BLEOCIN) 15 UNITS injection Inject into the vein once.  To be administered Day 1 & Day 15 every 28 days. To begin 02/04/15.    . chlorproMAZINE (THORAZINE) 25 MG tablet Take 1 tablet (25 mg total) by mouth 3 (three) times daily. (Patient not taking: Reported on 06/09/2015) 15 tablet 0  . dacarbazine (DTIC) 200 MG chemo injection Inject into the vein once. To be administered Day 1 & Day 15 every 28 days. To begin 02/04/15.    Marland Kitchen DOXOrubicin HCl (ADRIAMYCIN IV) Inject into the vein. To be administered Day 1 & Day 15 every 28 days. To begin 02/04/15.    Marland Kitchen ondansetron (ZOFRAN) 8 MG tablet Take 1 tablet (8 mg total) by mouth every 8 (eight) hours as needed for nausea or vomiting. (Patient not taking: Reported on 08/01/2015) 60 tablet 1  . penicillin v potassium (VEETID) 500 MG tablet Take one tablet by mouth 3 times daily (Patient not taking: Reported on 06/09/2015) 21 tablet  0  . potassium chloride SA (K-DUR,KLOR-CON) 10 MEQ tablet Take 1 tablet (10 mEq total) by mouth 2 (two) times daily. (Patient not taking: Reported on 08/01/2015) 60 tablet 0  . prochlorperazine (COMPAZINE) 10 MG tablet Take 1 tablet (10 mg total) by mouth every 6 (six) hours as needed for nausea or vomiting. (Patient not taking: Reported on 05/27/2015) 60 tablet 1  . vinBLAStine (VELBAN) 10 MG injection Inject into the vein. To be administered Day 1 & Day 15 every 28 days. To begin 02/04/15.     No current facility-administered medications for this visit.    Review of Systems  Constitutional: Negative for weight loss and diaphoresis. Negative for fever, chills and malaise/fatigue.  HENT: Negative for congestion, ear discharge, ear pain, hearing loss, nosebleeds, sore throat and tinnitus.   Eyes: Negative for blurred vision. Negative for double vision, photophobia, pain, discharge and redness.  Respiratory: Negative.  Negative for stridor.   Cardiovascular: Negative.   Gastrointestinal: Negative for heartburn, nausea, vomiting, diarrhea, blood in stool and melena. Genitourinary: Negative for  dysuria, frequency, hematuria and flank pain.  Musculoskeletal: Negative.   Skin: Negative.   Neurological: Negative for dizziness, tingling, tremors, sensory change, speech change, focal weakness, seizures, loss of consciousness and weakness.  Endo/Heme/Allergies: Negative.   Psychiatric/Behavioral: Positive for substance abuse. Negative for depression, suicidal ideas, hallucinations and memory loss.  The patient is not nervous/anxious.    14 point ROS was done and is otherwise as detailed above or in HPI   PHYSICAL EXAMINATION: ECOG PERFORMANCE STATUS: 1 - Symptomatic but completely ambulatory  Filed Vitals:   08/01/15 1005  BP: 91/54  Pulse: 80  Temp: 98.2 F (36.8 C)  Resp: 15   Filed Weights   08/01/15 1005  Weight: 177 lb 1.6 oz (80.332 kg)   Physical Exam  Constitutional: He is oriented to person, place, and time and well-developed, well-nourished, and in no distress.  HENT:  Head: Normocephalic and atraumatic.  Nose: Nose normal.  Mouth/Throat: Oropharynx is clear and moist. No oropharyngeal exudate.  Eyes: Conjunctivae and EOM are normal. Pupils are equal, round, and reactive to light. Right eye exhibits no discharge. Left eye exhibits no discharge. No scleral icterus.  Neck: Normal range of motion. Neck supple. No tracheal deviation present. No thyromegaly present.  Cardiovascular: Normal rate, regular rhythm and normal heart sounds.  Exam reveals no gallop and no friction rub. No murmur heard. Lymphatics: NO palpable adenopathy in the neck, supraclavicular, axillary or inguinal regions. Pulmonary/Chest: Effort normal and breath sounds normal. He has no wheezes. He has no rales. Port a cath accessed L chest Abdominal: Soft. Bowel sounds are normal. He exhibits no distension and no mass. There is no tenderness. There is no rebound and no guarding.  Genitourinary:  deferred Musculoskeletal: Normal range of motion. He exhibits no edema.   Neurological: He is alert and  oriented to person, place, and time. He has normal reflexes. No cranial nerve deficit. Gait normal. Coordination normal.  Skin: Skin is warm and dry. No rash noted.  Psychiatric: Mood, memory, affect and judgment normal.  Nursing note and vitals reviewed.   LABORATORY DATA:  I have reviewed the data as listed. Lab Results  Component Value Date   WBC 3.0* 07/08/2015   HGB 11.5* 07/08/2015   HCT 35.9* 07/08/2015   MCV 87.8 07/08/2015   PLT 273 07/08/2015   CMP     Component Value Date/Time   NA 141 07/08/2015 0916   K 4.1 07/08/2015 0916  CL 106 07/08/2015 0916   CO2 31 07/08/2015 0916   GLUCOSE 117* 07/08/2015 0916   BUN 12 07/08/2015 0916   CREATININE 1.05 07/08/2015 0916   CALCIUM 9.0 07/08/2015 0916   PROT 7.2 07/08/2015 0916   ALBUMIN 3.6 07/08/2015 0916   AST 20 07/08/2015 0916   ALT 15* 07/08/2015 0916   ALKPHOS 37* 07/08/2015 0916   BILITOT 0.3 07/08/2015 0916   GFRNONAA >60 07/08/2015 0916   GFRAA >60 07/08/2015 0916     PATHOLOGY:  BMBXIAGNOSIS Diagnosis Bone Marrow, Aspirate,Biopsy, and Clot, left iliac - NORMOCELLULAR BONE MARROW FOR AGE WITH TRILINEAGE HEMATOPOIESIS. - MILD POLYCLONAL PLASMACYTOSIS - NO TUMOR IDENTIFIED. PERIPHERAL BLOOD: - NORMOCYTIC-NORMOCHROMIC ANEMIA. Diagnosis Note There is no morphologic evidence of involvement by Hodgkin lymphoma in this material. Clinical correlation is recommended. (BNS:ecj 01/22/2015) Susanne Greenhouse MD Pathologist, Electronic Signature  LYMPH NODE BIOPSY Diagnosis Lymph node for lymphoma, left inguinal - CLASSICAL HODGKIN LYMPHOMA, NODULAR SCLEROSIS TYPE. - SEE ONCOLOGY TABLE. Microscopic Comment LYMPHOMA Histologic type: Classical Hodgkin lymphoma, nodular sclerosis type. Grade (if applicable): N/A. Flow cytometry: Not performed. Immunohistochemical stains: CD45, CD20, CD79a, CD3, CD30, and CD15. Touch preps/imprints: Mixed lymphoid population with scattered large cells with bi-/multi-nucleation and  prominent nucleoli. Comments: Sections of lymph nodes reveal effacement of the architecture by a nodular lymphoreticular proliferation. There are abundant large cells with bi-/multi-nucleation and prominent nucleoli, consistent with Hodgkin Reed-Sternberg (HRS) cells. These occur both scattered and in large loose clusters. There are lacunar variants and mummified cells. Focally, there are broad bands of fibrosis creating a more nodular appearance. In these areas the background reveals small lymphocytes and plasma cells with scattered eosinophils and neutrophils, while less fibrotic areas have a more lymphocytic background. The smaller node is only partially involved with areas of residual reactive lymph node. Immunohistochemistry reveals the HRS cells are positive for CD30 and CD15. They are negative for CD20, CD79a, and CD45. CD20 and CD79 highlight residual B-cell areas. CD3 highlights abundant T-cells. The overall findings are consistent with classical Hodgkin lymphoma, nodular sclerosis type. The case was called to Drs. Jenkins and Lyman on 01/15/2015. Vicente Males MD Pathologist, Electronic Signature (Case signed 01/15/2015) Specimen Gross and Clinical Information   RADIOGRAPHIC STUDIES: I have personally reviewed the radiological images as listed and agreed with the findings in the report.  CLINICAL DATA: Subsequent treatment strategy for Hodgkin's lymphoma.  EXAM: NUCLEAR MEDICINE PET SKULL BASE TO THIGH  TECHNIQUE: 11.1 mCi F-18 FDG was injected intravenously. Full-ring PET imaging was performed from the skull base to thigh after the radiotracer. CT data was obtained and used for attenuation correction and anatomic localization.  FASTING BLOOD GLUCOSE: Value: 84 mg/dl  COMPARISON: PET-CT 03/28/2015 and 01/28/2015.  FINDINGS: NECK  No hypermetabolic cervical lymph nodes are identified.There is progressive fairly symmetric hypermetabolic activity associate  with the lymphoid tissue of Waldeyer's ring, especially within the nasopharynx. This demonstrates an SUV max of 8.9. There is also increased metabolic activity throughout the nasal passages. There is also fairly symmetric activity associated with the muscles of phonation in the larynx and tongue.  CHEST  There are no hypermetabolic mediastinal, hilar or axillary lymph nodes. There are new patchy airspace opacities at both lung bases which are mildly hypermetabolic (SUV max 3.8). No focal pulmonary nodules demonstrated.  ABDOMEN/PELVIS  There is no hypermetabolic activity within the liver, adrenal glands, spleen or pancreas. The spleen remains normal in size. No hypermetabolic abdominal pelvic lymph nodes are identified. There is a small left inguinal node measuring  7 mm on image 184 which has an SUV max of 4.3.  SKELETON  There is no hypermetabolic activity to suggest osseous metastatic disease. The generalized increased marrow activity seen on the prior examination has improved. Scattered lucent lesions are grossly unchanged.  IMPRESSION: 1. No specific evidence of recurrent metabolically active Hodgkin's disease. 2. Small hypermetabolic left inguinal lymph node is nonspecific and potentially inflammatory. Correlate clinically. 3. Increased generalized metabolic activity within the lymphoid tissue of Waldeyer's ring, associated with hypermetabolic activity in the nasal passages, suggesting upper respiratory infection. Correlate clinically.   Electronically Signed  By: Richardean Sale M.D.  On: 07/29/2015 12:41   ASSESSMENT & PLAN:  Hodgkin Lymphoma, Nodular sclerosing type, Stage III-2A (? involvement in lingula) Abnormal CT of the pelvis with lytic bone disease, not PET avid Polyclonal gammopathy Elevated ESR IPS Score: 5 year freedom from progression and OS of 2; 80% FFP and 91% OS Eye EXAM on 01/29/2015 with possible lymphoma lesion in retina OD (Dr.  Iona Hansen) PET/CT after 2 cycles Deauville 1  He has completed all therapy with an excellent response. He currently has a URI but only of several days duration. No fever or chills. I reviewed his PET with him and recommend one additional f/u based upon findings.    I am referring him to [facility] opthalmology for additional follow-up in regards to his prior eye exam. He is due for a port flush.  Whether or not to pursue additional evaluation of the bone lucencies seen on imaging will be addressed at follow-up.   I will plan on seeing him back in one month for ongoing follow-up of his HL and other medical issues until everything is resolved.    I again addressed smoking cessation in detail with the patient.   All questions were answered. The patient knows to call the clinic with any problems, questions or concerns.   This note was electronically signed.    This document serves as a record of services personally performed by Ancil Linsey, MD. It was created on her behalf by Arlyce Harman, a trained medical scribe. The creation of this record is based on the scribe's personal observations and the provider's statements to them. This document has been checked and approved by the attending provider.  I have reviewed the above documentation for accuracy and completeness, and I agree with the above.  Kelby Fam. Whitney Muse, MD

## 2015-07-31 ENCOUNTER — Other Ambulatory Visit (HOSPITAL_COMMUNITY): Payer: Self-pay | Admitting: Oncology

## 2015-07-31 ENCOUNTER — Other Ambulatory Visit: Payer: Self-pay | Admitting: Physician Assistant

## 2015-07-31 DIAGNOSIS — C8190 Hodgkin lymphoma, unspecified, unspecified site: Secondary | ICD-10-CM

## 2015-07-31 MED ORDER — OXYCODONE-ACETAMINOPHEN 5-325 MG PO TABS: 1.0000 | ORAL_TABLET | Freq: Four times a day (QID) | ORAL | Status: DC | PRN

## 2015-08-01 ENCOUNTER — Telehealth (HOSPITAL_COMMUNITY): Payer: Self-pay | Admitting: Hematology & Oncology

## 2015-08-01 ENCOUNTER — Encounter (HOSPITAL_COMMUNITY): Payer: Medicaid Other | Attending: Hematology & Oncology | Admitting: Hematology & Oncology

## 2015-08-01 ENCOUNTER — Encounter (HOSPITAL_COMMUNITY): Payer: Self-pay | Admitting: Hematology & Oncology

## 2015-08-01 VITALS — BP 91/54 | HR 80 | Temp 98.2°F | Resp 15 | Wt 177.1 lb

## 2015-08-01 DIAGNOSIS — C8115 Nodular sclerosis classical Hodgkin lymphoma, lymph nodes of inguinal region and lower limb: Secondary | ICD-10-CM | POA: Diagnosis present

## 2015-08-01 DIAGNOSIS — M899 Disorder of bone, unspecified: Secondary | ICD-10-CM

## 2015-08-01 DIAGNOSIS — Z720 Tobacco use: Secondary | ICD-10-CM

## 2015-08-01 DIAGNOSIS — E8809 Other disorders of plasma-protein metabolism, not elsewhere classified: Secondary | ICD-10-CM | POA: Insufficient documentation

## 2015-08-01 DIAGNOSIS — C8108 Nodular lymphocyte predominant Hodgkin lymphoma, lymph nodes of multiple sites: Secondary | ICD-10-CM

## 2015-08-01 DIAGNOSIS — H579 Unspecified disorder of eye and adnexa: Secondary | ICD-10-CM

## 2015-08-01 MED ORDER — AZITHROMYCIN 250 MG PO TABS: ORAL_TABLET | ORAL | Status: DC

## 2015-08-01 NOTE — Patient Instructions (Addendum)
[place] at [facility] Discharge Instructions  RECOMMENDATIONS MADE BY THE CONSULTANT AND ANY TEST RESULTS WILL BE SENT TO YOUR REFERRING PHYSICIAN.  Exam completed by Dr Whitney Muse today PET scan looks good Port flush in 4 weeks Then port flsuhes every 8 weeks Referral to [facility] Ophthalmology, they will call you with your appointment If you are still having cold symptoms we may do some scans in 1 month Antibiotic given for your cold Refilled your pain medication Return to see the doctor in 1 month  Please call the clinic if you have any questions or concerns  Thank you for choosing [place] at [facility] to provide your oncology and hematology care.  To afford each patient quality time with our provider, please arrive at least 15 minutes before your scheduled appointment time.    You need to re-schedule your appointment should you arrive 10 or more minutes late.  We strive to give you quality time with our providers, and arriving late affects you and other patients whose appointments are after yours.  Also, if you no show three or more times for appointments you may be dismissed from the clinic at the providers discretion.     Again, thank you for choosing [facility].  Our hope is that these requests will decrease the amount of time that you wait before being seen by our physicians.       _____________________________________________________________  Should you have questions after your visit to [facility], please contact our office at [phone] between the hours of 8:30 a.m. and 4:30 p.m.  Voicemails left after 4:30 p.m. will not be returned until the following business day.  For prescription refill requests, have your pharmacy contact our office.

## 2015-08-01 NOTE — Telephone Encounter (Signed)
[facility]PT W/[facility]O OPTH ON 12/5 @ 9 30. THEY WILL CONTACT PT. FAXED [place] [phone]

## 2015-08-15 ENCOUNTER — Other Ambulatory Visit (HOSPITAL_COMMUNITY): Payer: Self-pay | Admitting: Hematology & Oncology

## 2015-08-15 DIAGNOSIS — H209 Unspecified iridocyclitis: Secondary | ICD-10-CM

## 2015-08-18 ENCOUNTER — Encounter (HOSPITAL_COMMUNITY): Payer: Medicaid Other

## 2015-08-18 DIAGNOSIS — E8809 Other disorders of plasma-protein metabolism, not elsewhere classified: Secondary | ICD-10-CM | POA: Diagnosis present

## 2015-08-18 DIAGNOSIS — H209 Unspecified iridocyclitis: Secondary | ICD-10-CM

## 2015-08-18 NOTE — Progress Notes (Signed)
Patient sent to lab for special tests to be drawn

## 2015-08-19 LAB — ANGIOTENSIN CONVERTING ENZYME: Angiotensin-Converting Enzyme: 37 U/L (ref 14–82)

## 2015-08-19 LAB — B. BURGDORFI ANTIBODIES: B burgdorferi Ab IgG+IgM: <0.91 {ISR} (ref 0.00–0.90)

## 2015-08-19 LAB — FLUORESCENT TREPONEMAL AB(FTA)-IGG-BLD: FLUORESCENT TREPONEMAL AB, IGG: NONREACTIVE

## 2015-08-19 LAB — HIV ANTIBODY (ROUTINE TESTING W REFLEX): HIV Screen 4th Generation wRfx: NONREACTIVE

## 2015-08-19 LAB — RPR: RPR Ser Ql: NONREACTIVE

## 2015-08-20 LAB — QUANTIFERON IN TUBE
QFT TB AG MINUS NIL VALUE: 0.02 IU/mL
QUANTIFERON MITOGEN VALUE: 8.15 IU/mL
QUANTIFERON TB AG VALUE: 0.07 [IU]/mL
QUANTIFERON TB GOLD: NEGATIVE
Quantiferon Nil Value: 0.05 IU/mL

## 2015-08-20 LAB — QUANTIFERON TB GOLD ASSAY (BLOOD)

## 2015-08-21 LAB — LYSOZYME, SERUM: Lysozyme: 8.7 ug/mL (ref 5.0–11.0)

## 2015-08-26 ENCOUNTER — Other Ambulatory Visit (HOSPITAL_COMMUNITY): Payer: Self-pay | Admitting: Emergency Medicine

## 2015-08-27 LAB — HLA-B27 ANTIGEN: HLA-B27: NEGATIVE

## 2015-08-29 ENCOUNTER — Other Ambulatory Visit (HOSPITAL_COMMUNITY): Payer: Self-pay | Admitting: Oncology

## 2015-08-29 ENCOUNTER — Telehealth (HOSPITAL_COMMUNITY): Payer: Self-pay | Admitting: *Deleted

## 2015-08-29 ENCOUNTER — Telehealth (HOSPITAL_COMMUNITY): Payer: Self-pay

## 2015-08-29 ENCOUNTER — Encounter (HOSPITAL_BASED_OUTPATIENT_CLINIC_OR_DEPARTMENT_OTHER): Payer: Medicaid Other

## 2015-08-29 VITALS — BP 122/74 | HR 61 | Temp 98.6°F | Resp 16

## 2015-08-29 DIAGNOSIS — C8115 Nodular sclerosis classical Hodgkin lymphoma, lymph nodes of inguinal region and lower limb: Secondary | ICD-10-CM | POA: Diagnosis not present

## 2015-08-29 DIAGNOSIS — C8190 Hodgkin lymphoma, unspecified, unspecified site: Secondary | ICD-10-CM

## 2015-08-29 DIAGNOSIS — Z95828 Presence of other vascular implants and grafts: Secondary | ICD-10-CM

## 2015-08-29 DIAGNOSIS — Z452 Encounter for adjustment and management of vascular access device: Secondary | ICD-10-CM

## 2015-08-29 MED ORDER — SODIUM CHLORIDE 0.9 % IJ SOLN
10.0000 mL | INTRAMUSCULAR | Status: DC | PRN
  Administered 2015-08-29: 20 mL via INTRAVENOUS
  Filled 2015-08-29: qty 10

## 2015-08-29 MED ORDER — HEPARIN SOD (PORK) LOCK FLUSH 100 UNIT/ML IV SOLN
500.0000 [IU] | Freq: Once | INTRAVENOUS | Status: AC
  Administered 2015-08-29: 500 [IU] via INTRAVENOUS
  Filled 2015-08-29: qty 5

## 2015-08-29 MED ORDER — OXYCODONE-ACETAMINOPHEN 5-325 MG PO TABS: 1.0000 | ORAL_TABLET | Freq: Four times a day (QID) | ORAL | Status: DC | PRN

## 2015-08-29 NOTE — Telephone Encounter (Signed)
-----   Message from Baird Cancer, PA-C sent at 08/29/2015  1:02 PM EST ----- Regarding: FW: refill Refilled.  Let him know that we will start to decline the # we provide beginning in Jan 2017 and over 2-4 months, we will stop prescribing pain medication. (Per. Dr. Whitney Muse)   ----- Message -----    From: [name]    Sent: 08/29/2015  10:17 AM      To: Baird Cancer, PA-C Subject: refill                                         Patient needs refill on oxy

## 2015-08-29 NOTE — Telephone Encounter (Signed)
-----   Message from Baird Cancer, PA-C sent at 08/29/2015  1:02 PM EST ----- Regarding: FW: refill Refilled.  Let him know that we will start to decline the # we provide beginning in Jan 2017 and over 2-4 months, we will stop prescribing pain medication. (Per. Dr. Whitney Muse)   ----- Message -----    From: Robert Acevedo    Sent: 08/29/2015  10:17 AM      To: Baird Cancer, PA-C Subject: refill                                         Patient needs refill on oxy

## 2015-08-29 NOTE — Telephone Encounter (Signed)
Patient notified and verbalized understanding of instructions. 

## 2015-08-29 NOTE — Patient Instructions (Signed)
[place] at [facility] Discharge Instructions  RECOMMENDATIONS MADE BY THE CONSULTANT AND ANY TEST RESULTS WILL BE SENT TO YOUR REFERRING PHYSICIAN.  Port flush today.  We will flush your port every 8 weeks.   Please return as scheduled.    Thank you for choosing [place] at [facility] to provide your oncology and hematology care.  To afford each patient quality time with our provider, please arrive at least 15 minutes before your scheduled appointment time.    You need to re-schedule your appointment should you arrive 10 or more minutes late.  We strive to give you quality time with our providers, and arriving late affects you and other patients whose appointments are after yours.  Also, if you no show three or more times for appointments you may be dismissed from the clinic at the providers discretion.     Again, thank you for choosing [facility].  Our hope is that these requests will decrease the amount of time that you wait before being seen by our physicians.       _____________________________________________________________  Should you have questions after your visit to [facility], please contact our office at [phone] between the hours of 8:30 a.m. and 4:30 p.m.  Voicemails left after 4:30 p.m. will not be returned until the following business day.  For prescription refill requests, have your pharmacy contact our office.

## 2015-08-29 NOTE — Telephone Encounter (Signed)
Patient notified of info noted below and he verbalized understanding

## 2015-08-29 NOTE — Progress Notes (Signed)
Robert Acevedo presented for Portacath access and flush. Proper placement of portacath confirmed by CXR. Portacath located left chest wall accessed with  H 20 needle. Good blood return present. Portacath flushed with 21ml NS and 500U/47ml Heparin and needle removed intact. Procedure without incident. Patient tolerated procedure well.

## 2015-09-02 ENCOUNTER — Encounter (HOSPITAL_COMMUNITY): Payer: Medicaid Other | Attending: Hematology & Oncology | Admitting: Hematology & Oncology

## 2015-09-02 ENCOUNTER — Encounter (HOSPITAL_COMMUNITY): Payer: Self-pay | Admitting: Hematology & Oncology

## 2015-09-02 VITALS — BP 110/66 | HR 73 | Temp 98.1°F | Resp 18 | Wt 177.8 lb

## 2015-09-02 DIAGNOSIS — C8115 Nodular sclerosis classical Hodgkin lymphoma, lymph nodes of inguinal region and lower limb: Secondary | ICD-10-CM | POA: Diagnosis not present

## 2015-09-02 DIAGNOSIS — M899 Disorder of bone, unspecified: Secondary | ICD-10-CM

## 2015-09-02 DIAGNOSIS — E8809 Other disorders of plasma-protein metabolism, not elsewhere classified: Secondary | ICD-10-CM | POA: Insufficient documentation

## 2015-09-02 DIAGNOSIS — M898X5 Other specified disorders of bone, thigh: Secondary | ICD-10-CM

## 2015-09-02 DIAGNOSIS — D890 Polyclonal hypergammaglobulinemia: Secondary | ICD-10-CM | POA: Diagnosis not present

## 2015-09-02 DIAGNOSIS — Z720 Tobacco use: Secondary | ICD-10-CM | POA: Diagnosis not present

## 2015-09-02 DIAGNOSIS — R935 Abnormal findings on diagnostic imaging of other abdominal regions, including retroperitoneum: Secondary | ICD-10-CM

## 2015-09-02 NOTE — Patient Instructions (Addendum)
[place] at [facility] Discharge Instructions  RECOMMENDATIONS MADE BY THE CONSULTANT AND ANY TEST RESULTS WILL BE SENT TO YOUR REFERRING PHYSICIAN.  We will schedule a MRI of the pelvis and then you will return to see Tom after the MRI.   Please continue to try to stop smoking.   Continue to see the Retina/Eye specialist. Your blood work looks great.   Continue to return every 8 weeks for your port flushes.       Thank you for choosing [place] at [facility] to provide your oncology and hematology care.  To afford each patient quality time with our provider, please arrive at least 15 minutes before your scheduled appointment time.    You need to re-schedule your appointment should you arrive 10 or more minutes late.  We strive to give you quality time with our providers, and arriving late affects you and other patients whose appointments are after yours.  Also, if you no show three or more times for appointments you may be dismissed from the clinic at the providers discretion.     Again, thank you for choosing [facility].  Our hope is that these requests will decrease the amount of time that you wait before being seen by our physicians.       _____________________________________________________________  Should you have questions after your visit to [facility], please contact our office at [phone] between the hours of 8:30 a.m. and 4:30 p.m.  Voicemails left after 4:30 p.m. will not be returned until the following business day.  For prescription refill requests, have your pharmacy contact our office.

## 2015-09-02 NOTE — Progress Notes (Signed)
[facility] NOTE  No care team member to display  CHIEF COMPLAINTS/PURPOSE OF CONSULTATION:  Hodgkin lymphoma Staging form: Lymphoid Neoplasms, AJCC 6th Edition Clinical stage from 03/04/2015: Stage III - Unsigned Staging comments:  Questionable involvement of lingula on PET imaging. BMBX negative. PET with mild hyper metabolism with borderline enlarged bilateral axillar and mediastinal nodes. Bilateral inguinal nodes.   NOTE: patient has lytic lesion in the pelvis that are NOT PET avid. Not biopsied  L inquinal LN biopsy on 01/13/2015 with final pathology showing Classical Hodgkin Lymphoma, nodular sclerosing type Bone marrow biopsy 01/20/2015 without evidence of malignancy    Hodgkin lymphoma ([place])   01/13/2015 Initial Biopsy L inguinal LN biopsy on 01/13/2015 with Classical Hodgkin Lymphoma, nodular sclerosing type   01/20/2015 Bone Marrow Biopsy normal BMBX, mild polyclonal plasmacytosis   01/28/2015 Imaging PET/CT Mild hyper metabolism is associated with the borderline enlarged bilateral axillary and mediastinal lymph nodes. There is more intense FDG uptake associated with bilateral inguinal lymph nodes. focal area of peripheral consolidation in the lingula.   01/29/2015 Imaging MUGA with EF 69%   01/29/2015 Imaging 1.spirometry is normal .no change with bronchodilator. lung volumes are normal. airway resistance somewhat high. DLCO normal decreased MIP/MEP may indicate weakness of the bellows function of the lung.    02/04/2015 -  Chemotherapy ABVD   03/28/2015 PET scan No adenopathy or abnormal hypermetabolic activity along the previous axillary, mediastinal, and inguinal lymph node chains. Deauville 1.   03/31/2015 Imaging MUGA- Normal LEFT ventricular ejection fraction of 70%, not significantly changed from the 69% calculated on the previous study of 01/29/2015.   05/30/2015 Imaging MUGA- No significant change in ejection fraction equal 73%   07/29/2015  Imaging PET/CT no specific evidence of recurrent metabolically active Hodgkin's     HISTORY OF PRESENTING ILLNESS:  Robert Acevedo 45 y.o. male is here for additional follow-up of Hodgkin Lymphoma. He has been diagnosed with uveitis and is seeing a retina specialist in [place].    He is present alone today and likes to be called Robert Acevedo He denies nausea, vomiting, diarrhea or constipation. He denies fever or chills.  His previous cold has resolved. He has been eating well. His energy levels are "so-so". He continues to smoke but has decreased to 1/2 pack every two days.   He will be returning to the retina specialist on 09/16/15. He has been using eye drops prescribed four times daily. They are unsure why his pupil is constantly dilated. He understands the importance of keeping up with future eye appointments. He notes that the retina specialist exam was "rough".  He has no other major complaints today.  MEDICAL HISTORY:  Past Medical History  Diagnosis Date  . Bronchitis   . Lymphadenopathy, inguinal     left  . Hodgkin's lymphoma ([place])     SURGICAL HISTORY: Past Surgical History  Procedure Laterality Date  . Wrist surgery      right  . Knee surgery    . Incision and drainage peritonsillar abscess    . Lymph node biopsy Left 01/13/2015    Procedure: LEFT INGUINAL LYMPH NODE BIOPSY;  Surgeon: Aviva Signs Md, MD;  Location: [facility];  Service: General;  Laterality: Left;  . Bone marrow biopsy Left 01/20/15  . Bone marrow aspiration Left 01/20/15  . Portacath placement Left 01/31/2015    Procedure: INSERTION PORT-A-CATH;  Surgeon: Aviva Signs Md, MD;  Location: [facility];  Service: General;  Laterality: Left;  SOCIAL HISTORY: Social History   Social History  . Marital Status: Single    Spouse Name: N/A  . Number of Children: N/A  . Years of Education: N/A   Occupational History  . Not on file.   Social History Main Topics  . Smoking status: Current Every Day Smoker  -- 0.50 packs/day for 2 years    Types: Cigarettes  . Smokeless tobacco: Never Used  . Alcohol Use: Yes     Comment: occasional  . Drug Use: Yes    Special: Marijuana     Comment: on occasion - last used 01/26/15  . Sexual Activity: Yes    Birth Control/ Protection: None   Other Topics Concern  . Not on file   Social History Narrative  Pt is a smoker starting 2008, pack of cigarettes a week. Infrequent drinking alcohol, maybe one or two once a month. Denies drug use other than daily marijuana. In jail for 18 months.  Has 3 kids, 21 yo, 2 yo, and 98 yo. Has 1 grandchild. He has a car.   FAMILY HISTORY: Family History  Problem Relation Age of Onset  . Seizures Mother   . Diabetes Father   Father is 21 yo diabetic. Mother is 63 yo. Both still living. 1 brother, 4 sisters. One sister has breast cancer, diagnosed 1996 in her 23s. Another sister has severe depression. He has been with his girlfriend 9-10 years. He lives with his girlfriend and his father. He hasn't told his father about his diagnosis yet. One sister lives in [place] and she is aware of his diagnosis. He told two of his childhood friends about his diagnosis.   ALLERGIES:  is allergic to clindamycin/lincomycin.  MEDICATIONS:  Current Outpatient Prescriptions  Medication Sig Dispense Refill  . amitriptyline (ELAVIL) 25 MG tablet Take one tablet at bedtime for 5 days then increase to two tablets thereafter 60 tablet 3  . citalopram (CELEXA) 20 MG tablet Take 1 tablet daily x 7 days. Then increase to 2 tablets daily. 49 tablet 0  . lidocaine-prilocaine (EMLA) cream Apply a quarter size amount to port site 1 hour prior to chemo. Do not rub in. Cover with plastic wrap. 30 g 3  . LORazepam (ATIVAN) 0.5 MG tablet Take 1 tablet (0.5 mg total) by mouth every 8 (eight) hours as needed for anxiety. 30 tablet 1  . omeprazole (PRILOSEC) 40 MG capsule Take 1 capsule (40 mg total) by mouth daily. 30 capsule 3  .  oxyCODONE-acetaminophen (PERCOCET/ROXICET) 5-325 MG tablet Take 1-2 tablets by mouth every 6 (six) hours as needed for severe pain. Take for Neulasta bone pain 45 tablet 0  . ondansetron (ZOFRAN) 8 MG tablet Take 1 tablet (8 mg total) by mouth every 8 (eight) hours as needed for nausea or vomiting. (Patient not taking: Reported on 09/02/2015) 60 tablet 1  . prochlorperazine (COMPAZINE) 10 MG tablet Take 1 tablet (10 mg total) by mouth every 6 (six) hours as needed for nausea or vomiting. (Patient not taking: Reported on 05/27/2015) 60 tablet 1   No current facility-administered medications for this visit.    Review of Systems  Constitutional: Negative for weight loss and diaphoresis. Negative for fever, chills and malaise/fatigue.  HENT: Negative for congestion, ear discharge, ear pain, hearing loss, nosebleeds, sore throat and tinnitus.   Eyes: Negative for blurred vision. Negative for double vision, photophobia, pain, discharge and redness.  Respiratory: Negative.  Negative for stridor.   Cardiovascular: Negative.   Gastrointestinal: Negative for heartburn,  nausea, vomiting, diarrhea, blood in stool and melena. Genitourinary: Negative for dysuria, frequency, hematuria and flank pain.  Musculoskeletal: Negative.   Skin: Negative.   Neurological: Negative for dizziness, tingling, tremors, sensory change, speech change, focal weakness, seizures, loss of consciousness and weakness.  Endo/Heme/Allergies: Negative.   Psychiatric/Behavioral:. Negative for depression, suicidal ideas, hallucinations and memory loss.  The patient is not nervous/anxious.    14 point ROS was done and is otherwise as detailed above or in HPI  PHYSICAL EXAMINATION: ECOG PERFORMANCE STATUS: 1 - Symptomatic but completely ambulatory  Filed Vitals:   09/02/15 0900  BP: 110/66  Pulse: 73  Temp: 98.1 F (36.7 C)  Resp: 18   Filed Weights   09/02/15 0900  Weight: 177 lb 12.8 oz (80.65 kg)   Physical Exam    Constitutional: He is oriented to person, place, and time and well-developed, well-nourished, and in no distress.  HENT:  Head: Normocephalic and atraumatic.  Nose: Nose normal.  Mouth/Throat: Oropharynx is clear and moist. No oropharyngeal exudate.  Eyes: Conjunctivae and EOM are normal. Pupils are equal, round, and reactive to light. Right eye exhibits no discharge. Left eye exhibits no discharge. No scleral icterus.  Neck: Normal range of motion. Neck supple. No tracheal deviation present. No thyromegaly present.  Cardiovascular: Normal rate, regular rhythm and normal heart sounds.  Exam reveals no gallop and no friction rub. No murmur heard. Lymphatics: NO palpable adenopathy in the neck, supraclavicular, axillary or inguinal regions. Pulmonary/Chest: Effort normal and breath sounds normal. He has no wheezes. He has no rales. Port a cath accessed L chest Abdominal: Soft. Bowel sounds are normal. He exhibits no distension and no mass. There is no tenderness. There is no rebound and no guarding.  Genitourinary:  deferred Musculoskeletal: Normal range of motion. He exhibits no edema.   Neurological: He is alert and oriented to person, place, and time. He has normal reflexes. No cranial nerve deficit. Gait normal. Coordination normal.  Skin: Skin is warm and dry. No rash noted.  Psychiatric: Mood, memory, affect and judgment normal.  Nursing note and vitals reviewed.   LABORATORY DATA:  I have reviewed the data as listed. Lab Results  Component Value Date   WBC 3.0* 07/08/2015   HGB 11.5* 07/08/2015   HCT 35.9* 07/08/2015   MCV 87.8 07/08/2015   PLT 273 07/08/2015   CMP     Component Value Date/Time   NA 141 07/08/2015 0916   K 4.1 07/08/2015 0916   CL 106 07/08/2015 0916   CO2 31 07/08/2015 0916   GLUCOSE 117* 07/08/2015 0916   BUN 12 07/08/2015 0916   CREATININE 1.05 07/08/2015 0916   CALCIUM 9.0 07/08/2015 0916   PROT 7.2 07/08/2015 0916   ALBUMIN 3.6 07/08/2015 0916    AST 20 07/08/2015 0916   ALT 15* 07/08/2015 0916   ALKPHOS 37* 07/08/2015 0916   BILITOT 0.3 07/08/2015 0916   GFRNONAA >60 07/08/2015 0916   GFRAA >60 07/08/2015 0916     PATHOLOGY:  BMBXIAGNOSIS Diagnosis Bone Marrow, Aspirate,Biopsy, and Clot, left iliac - NORMOCELLULAR BONE MARROW FOR AGE WITH TRILINEAGE HEMATOPOIESIS. - MILD POLYCLONAL PLASMACYTOSIS - NO TUMOR IDENTIFIED. PERIPHERAL BLOOD: - NORMOCYTIC-NORMOCHROMIC ANEMIA. Diagnosis Note There is no morphologic evidence of involvement by Hodgkin lymphoma in this material. Clinical correlation is recommended. (BNS:ecj 01/22/2015) Susanne Greenhouse MD Pathologist, Electronic Signature  LYMPH NODE BIOPSY Diagnosis Lymph node for lymphoma, left inguinal - CLASSICAL HODGKIN LYMPHOMA, NODULAR SCLEROSIS TYPE. - SEE ONCOLOGY TABLE. Microscopic Comment LYMPHOMA Histologic  type: Classical Hodgkin lymphoma, nodular sclerosis type. Grade (if applicable): N/A. Flow cytometry: Not performed. Immunohistochemical stains: CD45, CD20, CD79a, CD3, CD30, and CD15. Touch preps/imprints: Mixed lymphoid population with scattered large cells with bi-/multi-nucleation and prominent nucleoli. Comments: Sections of lymph nodes reveal effacement of the architecture by a nodular lymphoreticular proliferation. There are abundant large cells with bi-/multi-nucleation and prominent nucleoli, consistent with Hodgkin Reed-Sternberg (HRS) cells. These occur both scattered and in large loose clusters. There are lacunar variants and mummified cells. Focally, there are broad bands of fibrosis creating a more nodular appearance. In these areas the background reveals small lymphocytes and plasma cells with scattered eosinophils and neutrophils, while less fibrotic areas have a more lymphocytic background. The smaller node is only partially involved with areas of residual reactive lymph node. Immunohistochemistry reveals the HRS cells are positive for CD30 and  CD15. They are negative for CD20, CD79a, and CD45. CD20 and CD79 highlight residual B-cell areas. CD3 highlights abundant T-cells. The overall findings are consistent with classical Hodgkin lymphoma, nodular sclerosis type. The case was called to Drs. Jenkins and [place] on 01/15/2015. Vicente Males MD Pathologist, Electronic Signature (Case signed 01/15/2015) Specimen Gross and Clinical Information   RADIOGRAPHIC STUDIES: I have personally reviewed the radiological images as listed and agreed with the findings in the report.  CLINICAL DATA: Subsequent treatment strategy for Hodgkin's lymphoma.  EXAM: NUCLEAR MEDICINE PET SKULL BASE TO THIGH  TECHNIQUE: 11.1 mCi F-18 FDG was injected intravenously. Full-ring PET imaging was performed from the skull base to thigh after the radiotracer. CT data was obtained and used for attenuation correction and anatomic localization.  FASTING BLOOD GLUCOSE: Value: 84 mg/dl  COMPARISON: PET-CT 03/28/2015 and 01/28/2015.  FINDINGS: NECK  No hypermetabolic cervical lymph nodes are identified.There is progressive fairly symmetric hypermetabolic activity associate with the lymphoid tissue of Waldeyer's ring, especially within the nasopharynx. This demonstrates an SUV max of 8.9. There is also increased metabolic activity throughout the nasal passages. There is also fairly symmetric activity associated with the muscles of phonation in the larynx and tongue.  CHEST  There are no hypermetabolic mediastinal, hilar or axillary lymph nodes. There are new patchy airspace opacities at both lung bases which are mildly hypermetabolic (SUV max 3.8). No focal pulmonary nodules demonstrated.  ABDOMEN/PELVIS  There is no hypermetabolic activity within the liver, adrenal glands, spleen or pancreas. The spleen remains normal in size. No hypermetabolic abdominal pelvic lymph nodes are identified. There is a small left inguinal node measuring 7  mm on image 184 which has an SUV max of 4.3.  SKELETON  There is no hypermetabolic activity to suggest osseous metastatic disease. The generalized increased marrow activity seen on the prior examination has improved. Scattered lucent lesions are grossly unchanged.  IMPRESSION: 1. No specific evidence of recurrent metabolically active Hodgkin's disease. 2. Small hypermetabolic left inguinal lymph node is nonspecific and potentially inflammatory. Correlate clinically. 3. Increased generalized metabolic activity within the lymphoid tissue of Waldeyer's ring, associated with hypermetabolic activity in the nasal passages, suggesting upper respiratory infection. Correlate clinically.   Electronically Signed  By: Richardean Sale M.D.  On: 07/29/2015 12:41   ASSESSMENT & PLAN:  Hodgkin Lymphoma, Nodular sclerosing type, Stage III-2A (? involvement in lingula) Abnormal CT of the pelvis with lytic bone disease, not PET avid Polyclonal gammopathy Elevated ESR IPS Score: 5 year freedom from progression and OS of 2; 80% FFP and 91% OS Eye EXAM on 01/29/2015 with possible lymphoma lesion in retina OD (Dr. Iona Hansen) PET/CT after  2 cycles Deauville 1   I again addressed smoking cessation in detail with the patient.   The patient has been seen by a retina specialist for uveitis. He is scheduled to follow up with her on 09/16/15.  I have ordered an MRI of the pelvis to further look into the findings initially seen on CT imaging at diagnosis.. We will see him again to discuss these imaging results and for any further recommendations.   All questions were answered. The patient knows to call the clinic with any problems, questions or concerns.   This note was electronically signed.    This document serves as a record of services personally performed by Ancil Linsey, MD. It was created on her behalf by Arlyce Harman, a trained medical scribe. The creation of this record is based on  the scribe's personal observations and the provider's statements to them. This document has been checked and approved by the attending provider.  I have reviewed the above documentation for accuracy and completeness, and I agree with the above.  Kelby Fam. Whitney Muse, MD

## 2015-09-10 ENCOUNTER — Ambulatory Visit (HOSPITAL_COMMUNITY)
Admission: RE | Admit: 2015-09-10 | Discharge: 2015-09-10 | Disposition: A | Discharge status: Home / Self Care | Payer: Medicaid Other | Source: Ambulatory Visit | Attending: Hematology & Oncology | Admitting: Hematology & Oncology

## 2015-09-10 DIAGNOSIS — M25559 Pain in unspecified hip: Secondary | ICD-10-CM | POA: Diagnosis present

## 2015-09-10 DIAGNOSIS — D890 Polyclonal hypergammaglobulinemia: Secondary | ICD-10-CM | POA: Insufficient documentation

## 2015-09-10 DIAGNOSIS — Z8571 Personal history of Hodgkin lymphoma: Secondary | ICD-10-CM | POA: Insufficient documentation

## 2015-09-10 DIAGNOSIS — M898X5 Other specified disorders of bone, thigh: Secondary | ICD-10-CM

## 2015-09-10 DIAGNOSIS — M898X8 Other specified disorders of bone, other site: Secondary | ICD-10-CM | POA: Insufficient documentation

## 2015-09-10 MED ORDER — GADOBENATE DIMEGLUMINE 529 MG/ML IV SOLN
16.0000 mL | Freq: Once | INTRAVENOUS | Status: AC | PRN
  Administered 2015-09-10: 16 mL via INTRAVENOUS

## 2015-09-12 ENCOUNTER — Ambulatory Visit (HOSPITAL_COMMUNITY): Payer: Medicaid Other | Admitting: Oncology

## 2015-09-12 NOTE — Assessment & Plan Note (Signed)
Stage III Hodgkin's Lymphoma with questionable involvement of lingula on PET imaging. BMBX negative. PET with mild hypermetabolism with borderline enlarged bilateral axillar and mediastinal nodes. Bilateral inguinal nodes.  Restaging PET following 2 cycles of ABVD demonstrates a complete remission with Deauville 1 noted.  PET imaging following completion of treatment demonstrates a CR.  Oncology history is updated.  I personally reviewed and went over radiographic studies with the patient.  The results are noted within this dictation.  MRI of pelvis continues to demonstrate evidence of lytic bone lesions that are not metabolic on PET imaging.  Future evaluation of orbits with specialist is scheduled for 09/16/2015.

## 2015-10-24 ENCOUNTER — Encounter (HOSPITAL_COMMUNITY): Payer: Self-pay

## 2015-10-24 ENCOUNTER — Encounter (HOSPITAL_COMMUNITY): Payer: Medicaid Other | Attending: Hematology & Oncology

## 2015-10-24 DIAGNOSIS — C8115 Nodular sclerosis classical Hodgkin lymphoma, lymph nodes of inguinal region and lower limb: Secondary | ICD-10-CM | POA: Diagnosis not present

## 2015-10-24 DIAGNOSIS — E8809 Other disorders of plasma-protein metabolism, not elsewhere classified: Secondary | ICD-10-CM | POA: Insufficient documentation

## 2015-10-24 DIAGNOSIS — Z452 Encounter for adjustment and management of vascular access device: Secondary | ICD-10-CM | POA: Diagnosis not present

## 2015-10-24 MED ORDER — HEPARIN SOD (PORK) LOCK FLUSH 100 UNIT/ML IV SOLN
500.0000 [IU] | Freq: Once | INTRAVENOUS | Status: AC
  Administered 2015-10-24: 500 [IU] via INTRAVENOUS
  Filled 2015-10-24: qty 5

## 2015-10-24 MED ORDER — SODIUM CHLORIDE 0.9% FLUSH
10.0000 mL | INTRAVENOUS | Status: DC | PRN
  Administered 2015-10-24: 10 mL via INTRAVENOUS
  Filled 2015-10-24: qty 10

## 2015-10-24 NOTE — Patient Instructions (Signed)
Port flush today R/S your Dr. appointment

## 2015-10-24 NOTE — Progress Notes (Signed)
.  Robert Acevedo presented for Portacath access and flush  Portacath located lt chest wall accessed with  H 20 needle.  Good blood return present. Portacath flushed with 68ml NS and 500U/39ml Heparin and needle removed intact.  Procedure tolerated well and without incident.

## 2015-11-04 NOTE — Progress Notes (Signed)
No primary care provider on file. No primary provider on file.  Nodular lymphocyte predominant Hodgkin lymphoma, unspecified body region [facility]) - Plan: CBC with Differential, Comprehensive metabolic panel, Lactate dehydrogenase, Sedimentation rate, CBC with Differential, Comprehensive metabolic panel, Sedimentation rate, CT Abdomen Pelvis W Contrast, CT Chest W Contrast, CT Soft Tissue Neck W Wo Contrast, CBC with Differential, Comprehensive metabolic panel, Sedimentation rate  Lytic bone lesion of hip - Plan: CT Biopsy  CURRENT THERAPY: Surveillance per NCCN guidelines  INTERVAL HISTORY: Robert Acevedo 45 y.o. male returns for followup of Stage III Hodgkin's Lymphoma with questionable involvement of lingula on PET imaging. BMBX negative. PET with mild hypermetabolism with borderline enlarged bilateral axillary and mediastinal nodes. Bilateral inguinal nodes. Restaging PET following 2 cycles of ABVD demonstrates a complete remission with Deauville 1 noted.    Hodgkin lymphoma ([place])   01/13/2015 Initial Biopsy L inguinal LN biopsy on 01/13/2015 with Classical Hodgkin Lymphoma, nodular sclerosing type   01/20/2015 Bone Marrow Biopsy normal BMBX, mild polyclonal plasmacytosis   01/28/2015 Imaging PET/CT Mild hyper metabolism is associated with the borderline enlarged bilateral axillary and mediastinal lymph nodes. There is more intense FDG uptake associated with bilateral inguinal lymph nodes. focal area of peripheral consolidation in the lingula.   01/29/2015 Imaging MUGA with EF 69%   01/29/2015 Imaging 1.spirometry is normal .no change with bronchodilator. lung volumes are normal. airway resistance somewhat high. DLCO normal decreased MIP/MEP may indicate weakness of the bellows function of the lung.    02/04/2015 - 07/08/2015 Chemotherapy ABVD x 6 cycles   03/28/2015 PET scan No adenopathy or abnormal hypermetabolic activity along the previous axillary, mediastinal, and inguinal lymph  node chains. Deauville 1.   03/31/2015 Imaging MUGA- Normal LEFT ventricular ejection fraction of 70%, not significantly changed from the 69% calculated on the previous study of 01/29/2015.   05/30/2015 Imaging MUGA- No significant change in ejection fraction equal 73%   07/29/2015 Imaging PET/CT no specific evidence of recurrent metabolically active Hodgkin's   07/29/2015 Remission    09/10/2015 Imaging MRI pelvis- Multiple stable lytic slightly enhancing lesions in the iliac bones and sacrum, unchanged since 12/20/2014. Even though biopsy was negative for myeloma in May 2016, I think the patient remains at increased risk for development of myeloma.    I personally reviewed and went over laboratory results with the patient.  The results are noted within this dictation. These will be updated with his next port flush.  I personally reviewed and went over radiographic studies with the patient.  The results are noted within this dictation.    I reviewed his MRI of the pelvis with the patient that shows lytic, slightly enhancing, lesions in the iliac bones and sacrum. They are stable compared to his April 2016 MRI of pelvis. However, unfortunately, radiologist's reading the imaging continues to mention myeloma despite a negative bone marrow biopsy in the past in addition to peripheral workup for myeloma and a PET scan, all of which are negative.  "I want my port out." He is educated that we like to keep Port-A-Cath's for approximately 1 year on completion of chemotherapy. He is agreeable to this for now. We'll continue with port flushes every 6-8 weeks.  He continues to follow with ophthalmology regarding his uveitis. He notes he continues with eyedrops and he reports that his signs and symptoms are improving. He reports a follow-up appointment later this month.  I reviewed the NCCN guidelines pertaining to surveillance for Hodgkin's  lymphoma. These are copied and pasted into his discharge instructions  today.  He denies any B symptoms. He denies any complaints today. He reports his appetite is good. He remains active.   Past Medical History  Diagnosis Date  . Bronchitis   . Lymphadenopathy, inguinal     left  . Hodgkin's lymphoma ([place])     has Lytic bone lesion of hip; Abnormal presence of protein in urine; Hodgkin lymphoma ([place]); and Uveitis on his problem list.     is allergic to clindamycin/lincomycin.  Current Outpatient Prescriptions on File Prior to Visit  Medication Sig Dispense Refill  . amitriptyline (ELAVIL) 25 MG tablet Take one tablet at bedtime for 5 days then increase to two tablets thereafter 60 tablet 3  . citalopram (CELEXA) 20 MG tablet Take 1 tablet daily x 7 days. Then increase to 2 tablets daily. 49 tablet 0  . lidocaine-prilocaine (EMLA) cream Apply a quarter size amount to port site 1 hour prior to chemo. Do not rub in. Cover with plastic wrap. 30 g 3  . LORazepam (ATIVAN) 0.5 MG tablet Take 1 tablet (0.5 mg total) by mouth every 8 (eight) hours as needed for anxiety. 30 tablet 1  . omeprazole (PRILOSEC) 40 MG capsule Take 1 capsule (40 mg total) by mouth daily. 30 capsule 3  . ondansetron (ZOFRAN) 8 MG tablet Take 1 tablet (8 mg total) by mouth every 8 (eight) hours as needed for nausea or vomiting. (Patient not taking: Reported on 09/02/2015) 60 tablet 1  . oxyCODONE-acetaminophen (PERCOCET/ROXICET) 5-325 MG tablet Take 1-2 tablets by mouth every 6 (six) hours as needed for severe pain. Take for Neulasta bone pain 45 tablet 0  . prochlorperazine (COMPAZINE) 10 MG tablet Take 1 tablet (10 mg total) by mouth every 6 (six) hours as needed for nausea or vomiting. (Patient not taking: Reported on 05/27/2015) 60 tablet 1   No current facility-administered medications on file prior to visit.    Past Surgical History  Procedure Laterality Date  . Wrist surgery      right  . Knee surgery    . Incision and drainage peritonsillar abscess    . Lymph node biopsy Left  01/13/2015    Procedure: LEFT INGUINAL LYMPH NODE BIOPSY;  Surgeon: Aviva Signs Md, MD;  Location: [facility];  Service: General;  Laterality: Left;  . Bone marrow biopsy Left 01/20/15  . Bone marrow aspiration Left 01/20/15  . Portacath placement Left 01/31/2015    Procedure: INSERTION PORT-A-CATH;  Surgeon: Aviva Signs Md, MD;  Location: [facility];  Service: General;  Laterality: Left;    Denies any headaches, dizziness, double vision, fevers, chills, night sweats, nausea, vomiting, diarrhea, constipation, chest pain, heart palpitations, shortness of breath, blood in stool, black tarry stool, urinary pain, urinary burning, urinary frequency, hematuria.   PHYSICAL EXAMINATION  ECOG PERFORMANCE STATUS: 0 - Asymptomatic  Filed Vitals:   11/06/15 0918  BP: 107/64  Pulse: 76  Temp: 98.2 F (36.8 C)  Resp: 20    GENERAL:alert, well nourished, well developed, comfortable, cooperative, smiling and unaccompanied SKIN: skin color, texture, turgor are normal, no rashes or significant lesions HEAD: Normocephalic, No masses, lesions, tenderness or abnormalities EYES: normal, EOMI, Conjunctiva are pink and non-injected EARS: External ears normal OROPHARYNX:lips, buccal mucosa, and tongue normal and mucous membranes are moist  NECK: supple, no adenopathy, trachea midline LYMPH:  no palpable lymphadenopathy BREAST:not examined LUNGS: clear to auscultation and percussion HEART: regular rate & rhythm, no murmurs, no gallops, S1  normal and S2 normal ABDOMEN:abdomen soft, non-tender, normal bowel sounds and no masses or organomegaly BACK: Back symmetric, no curvature. EXTREMITIES:less then 2 second capillary refill, no joint deformities, effusion, or inflammation, no skin discoloration, no cyanosis, fingernails darkening are improving. NEURO: alert & oriented x 3 with fluent speech, no focal motor/sensory deficits, gait normal   LABORATORY DATA: CBC    Component Value Date/Time   WBC 3.0* 07/08/2015  0916   RBC 4.09* 07/08/2015 0916   HGB 11.5* 07/08/2015 0916   HCT 35.9* 07/08/2015 0916   PLT 273 07/08/2015 0916   MCV 87.8 07/08/2015 0916   MCH 28.1 07/08/2015 0916   MCHC 32.0 07/08/2015 0916   RDW 16.6* 07/08/2015 0916   LYMPHSABS 1.0 07/08/2015 0916   MONOABS 0.4 07/08/2015 0916   EOSABS 0.1 07/08/2015 0916   BASOSABS 0.0 07/08/2015 0916      Chemistry      Component Value Date/Time   NA 141 07/08/2015 0916   K 4.1 07/08/2015 0916   CL 106 07/08/2015 0916   CO2 31 07/08/2015 0916   BUN 12 07/08/2015 0916   CREATININE 1.05 07/08/2015 0916      Component Value Date/Time   CALCIUM 9.0 07/08/2015 0916   ALKPHOS 37* 07/08/2015 0916   AST 20 07/08/2015 0916   ALT 15* 07/08/2015 0916   BILITOT 0.3 07/08/2015 0916        PENDING LABS:   RADIOGRAPHIC STUDIES:  No results found.   PATHOLOGY:    ASSESSMENT AND PLAN:  Hodgkin lymphoma Stage III Hodgkin's Lymphoma with questionable involvement of lingula on PET imaging. BMBX negative. PET with mild hypermetabolism with borderline enlarged bilateral axillary and mediastinal nodes. Bilateral inguinal nodes.  Restaging PET following 2 cycles of ABVD demonstrates a complete remission with Deauville 1 noted and CR on PET scan at time of completion of therapy in November 2016.  Oncology history is updated.  Labs with next port flush: CBC diff, CMET, LDH, ESR.  ESR is not documented to be elevated at time of diagnosis.  I do not see where it was checked in the past.  CT neck/chest/abdomen/pelvis with contrast in May 2017 (6 month mark).    He has been followed by opthalmology for uveitis.  This pre-dates his Hodgkin's diagnosis and treatment.  He has a follow-up appointment later this month.  His MRI of pelvis remains abnormal, but not likely from a Hodgkin's Lymphoma perspective.  Radiologist continues to mention the possibility of myeloma but with normal SPEP+IFE, PET scan, etc, the likelihood of this is very low.   However, given most recent radiographic report, I have offered the patient a biopsy of one of these abnormal sites via IR. Order placed for CT guided biopsy.  Labs in 3 months: CBC diff, CMET, ESR.  Return in 3 months for follow-up.    THERAPY PLAN:  NCCN guidelines for surveillance of Hodgkin's Lymphoma (Age >/= 70) (1.2017):  A. Complete remission should be documented including reversion of PET to "negative" within 3 months following completion of therapy.  B. It is recommended that the patient be provided with a treatment summary at the completion of his/her therapy, including details of radiation therapy, organs at risk, and cumulative anthracycline dosage given.  C. Follow-up with an oncologist is recommended, especially during the first 5 years after treatment to detect recurrence, and then annually due to the risk of late complications including second cancers and cardiovascular disease. Late relapse or transformation to large cell lymphoma may occur in  [place].  D. Frequency and types of tests may vary depending on clinical circumstances: age and stage at diagnosis, social habits, treatment modality, etc. There are few data to support specific recommendations; these represent the range of practice at [employer].   1. Follow-up after completion of treatment up to 5 years:   A. Interim H and P: Every 3-6 months for 1- 2 years, then every 6-12 months until year 3, then annually.   B. Annual influenza vaccine.   C. Laboratory studies:    1. CBC, platelets, ESR (if elevated at time of initial diagnosis), chemistry profile as clinically indicated.    2. Thyroid stimulating hormone (TSH) at least annually if radiation therapy to neck.   D. Acceptable to obtain a neck/chest/abdomen/pelvis CT scan with contrast, at 6, 12, and 24 month following completion of therapy, or as clinically indicated. PET/CT only if last PET was Deauville 4-5, to confirm complete response.   E. Counseling:  Reproduction, health habits, psychosocial, cardiovascular, breast self-exam, skin cancer risk, end of treatment discussion.   F. Surveillance PET should not be done routinely due to risk of false positives. Management decision should not be based on PET scan alone; clinical or pathologic correlation is needed.   2. Follow-up and monitoring after 5 years:   A. Interim H&P: Annually    1. Annual blood pressure, aggressive management of cardiovascular risk factors.    2. Pneumococcal, meningococcal, and H-flu revaccination after 5-7 years, if patient treated with splenic radiation therapy or previous splenectomy (according to CBC recommendations).    3. Annual influenza vaccine.   B. Cardiovascular symptoms may emerge at a young age.    1. Consider stress test/echocardiogram at 10 year intervals after treatment is completed.    2. Consider carotid ultrasound at 10 year intervals if neck irradiation.   C. Laboratory studies:    1. CBC, platelets, chemistry profile annually    2. TSH at least annually if radiation therapy to neck    3. Biannual lipids    4. Annual fasting glucose   D. Annual breast screening: Initiate 8-10 years post therapy, or at age 44, whichever comes first, if chest or axillary radiation. The NCCN Hodgkin Lymphoma Guidelines Panel recommends breast MRI in addition to mammography for women who've received irradiation to the chest between ages 72-30 years, which is consistent with the [place] (ACS) Guidelines. Consider referral to a breast specialist.   E. Perform other routine surveillance tests for cervical, colorectal, endometrial, lung, and prostate cancer as per the ACS Cancer Screening Guidelines.   F. Counseling: Reproduction, health habits, psychosocial, cardiovascular, breast self-exam, and skin cancer risk.   G. Treatment summary and consideration of transfer to primary care provider.   H. Consider a referral to a survivorship clinic.      All  questions were answered. The patient knows to call the clinic with any problems, questions or concerns. We can certainly see the patient much sooner if necessary.  Patient and plan discussed with Dr. Ancil Linsey and she is in agreement with the aforementioned.   This note is electronically signed by: Doy Mince 11/06/2015 9:47 AM

## 2015-11-04 NOTE — Assessment & Plan Note (Addendum)
Stage III Hodgkin's Lymphoma with questionable involvement of lingula on PET imaging. BMBX negative. PET with mild hypermetabolism with borderline enlarged bilateral axillary and mediastinal nodes. Bilateral inguinal nodes.  Restaging PET following 2 cycles of ABVD demonstrates a complete remission with Deauville 1 noted and CR on PET scan at time of completion of therapy in November 2016.  Oncology history is updated.  Labs with next port flush: CBC diff, CMET, LDH, ESR.  ESR is not documented to be elevated at time of diagnosis.  I do not see where it was checked in the past.  CT neck/chest/abdomen/pelvis with contrast in May 2017 (6 month mark).    He has been followed by opthalmology for uveitis.  This pre-dates his Hodgkin's diagnosis and treatment.  He has a follow-up appointment later this month.  His MRI of pelvis remains abnormal, but not likely from a Hodgkin's Lymphoma perspective.  Radiologist continues to mention the possibility of myeloma but with normal SPEP+IFE, PET scan, etc, the likelihood of this is very low.  However, given most recent radiographic report, I have offered the patient a biopsy of one of these abnormal sites via IR. Order placed for CT guided biopsy.  Labs in 3 months: CBC diff, CMET, ESR.  Return in 3 months for follow-up.

## 2015-11-06 ENCOUNTER — Encounter (HOSPITAL_COMMUNITY): Payer: Self-pay | Admitting: Oncology

## 2015-11-06 ENCOUNTER — Encounter (HOSPITAL_COMMUNITY): Payer: Medicaid Other | Attending: Hematology & Oncology | Admitting: Oncology

## 2015-11-06 VITALS — BP 107/64 | HR 76 | Temp 98.2°F | Resp 20 | Wt 181.8 lb

## 2015-11-06 DIAGNOSIS — C8100 Nodular lymphocyte predominant Hodgkin lymphoma, unspecified site: Secondary | ICD-10-CM

## 2015-11-06 DIAGNOSIS — M899 Disorder of bone, unspecified: Secondary | ICD-10-CM | POA: Diagnosis not present

## 2015-11-06 DIAGNOSIS — E8809 Other disorders of plasma-protein metabolism, not elsewhere classified: Secondary | ICD-10-CM | POA: Insufficient documentation

## 2015-11-06 DIAGNOSIS — M898X5 Other specified disorders of bone, thigh: Secondary | ICD-10-CM

## 2015-11-06 NOTE — Patient Instructions (Signed)
[place] at [facility] Discharge Instructions  RECOMMENDATIONS MADE BY THE CONSULTANT AND ANY TEST RESULTS WILL BE SENT TO YOUR REFERRING PHYSICIAN.   Exam done and seen Robert Acevedo today. Labs and port flush with next appt. CT scan in May Went over your test results. Biopsy done of pelvis Labs every 65month,follow up with Dr. PWhitney MuseDone with Chemo for now. Port out in about 1 year.  [facility].  To afford each patient quality time with our provider, please arrive at least 15 minutes before your scheduled appointment time.   Beginning January 23rd 2017 lab work [facility] in the  Main lab at [facility]. If you have a lab appointment with the [facility] come in thru the  Main Entrance and check in at the main information desk  You need to re-schedule your appointment should you arrive 10 or more minutes late.  We strive to give you quality time with our providers, and arriving late affects you and other patients whose appointments are after yours.  Also, if you no show three or more times for appointments you may be dismissed from the clinic at the providers discretion.     Again, [facility]  Our hope is that these requests will decrease the amount of time that you wait before being seen by our physicians.         _____________________________________________________________ THERAPY PLAN:  NCCN guidelines for surveillance of Hodgkin's Lymphoma (Age >/= 18) (1.2017): A. Complete remission should be documented including reversion of PET to "negative" within 3 months following completion of therapy. B. It is recommended that the patient be provided with a treatment summary at the completion of his/her therapy, including details of radiation therapy,  organs at risk, and cumulative anthracycline dosage given. C. Follow-up with an oncologist is recommended, especially during the first 5 years after treatment to detect recurrence, and then annually due to the risk of late complications including second cancers and cardiovascular disease. Late relapse or transformation to large cell lymphoma may occur in [place]. Frequency and types of tests may vary depending on clinical circumstances: age and stage at diagnosis, social habits, treatment modality, etc. There are few data to support specific recommendations; these represent the range of practice at [employer]  1. Follow-up after completion of treatment up to 5 years: A. Interim H and P: Every 3-6 months for 1- 2 years, then every 6-12 months until year 3, then annually. B. Annual influenza vaccine. C. Laboratory studies: 1. CBC, platelets, ESR (if elevated at time of initial diagnosis), chemistry profile as clinically indicated. 2. Thyroid stimulating hormone (TSH) at least annually if radiation therapy to neck. D. Acceptable to obtain a neck/chest/abdomen/pelvis CT scan with contrast, at 6, 12, and 24 month following completion of therapy, or as clinically indicated. PET/CT only if last PET was Deauville 4-5, to confirm complete response. E. Counseling: Reproduction, health habits, psychosocial, cardiovascular, breast self-exam, skin cancer risk, end of treatment discussion. F. Surveillance PET should not be done routinely due to risk of false positives. Management decision should not be based on PET scan alone; clinical or pathologic correlation is needed.  2. Follow-up and monitoring after 5  years: A. Interim H&P: Annually 1. Annual blood pressure, aggressive management of cardiovascular risk factors. 2. Pneumococcal,  meningococcal, and H-flu revaccination after 5-7 years, if patient treated with splenic radiation therapy or previous splenectomy (according to CBC recommendations). 3. Annual influenza vaccine. B. Cardiovascular symptoms may emerge at a young age. 1. Consider stress test/echocardiogram at 10 year intervals after treatment is completed. 2. Consider carotid ultrasound at 10 year intervals if neck irradiation. C. Laboratory studies: 1. CBC, platelets, chemistry profile annually 2. TSH at least annually if radiation therapy to neck 3. Biannual lipids 4. Annual fasting glucose D. Annual breast screening: Initiate 8-10 years post therapy, or at age 36, whichever comes first, if chest or axillary radiation. The NCCN Hodgkin Lymphoma Guidelines Panel recommends breast MRI in addition to mammography for women who've received irradiation to the chest between ages 50-30 years, which is consistent with the [place] (ACS) Guidelines. Consider referral to a breast specialist. E. Perform other routine surveillance tests for cervical, colorectal, endometrial, lung, and prostate cancer as per the ACS Cancer Screening Guidelines. F. Counseling: Reproduction, health habits, psychosocial, cardiovascular, breast self-exam, and skin cancer risk. G. Treatment summary and consideration of transfer  to primary care provider. H. Consider a referral to a survivorship clinic.     Should you have questions after your visit to [facility], please contact our office at [phone] between the hours of 8:30 a.m. and 4:30 p.m.  Voicemails left after 4:30 p.m. will not be returned until the following business day.  For prescription refill requests, have your pharmacy contact our office.         Resources For Cancer Patients and their Caregivers ? American Cancer Society: Can assist with transportation, wigs, general needs, runs Look Good Feel Better.        7093993436 ? Cancer Care: Provides financial assistance, online support groups, medication/co-pay assistance.  1-800-813-HOPE 709-490-8267) ? [facility] Assists [place] cancer patients and their families through emotional , educational and financial support.  534-212-8302 ? [place] DSS Where to apply for food stamps, Medicaid and utility assistance. (647) 271-1126 ? [facility]: Transportation to medical appointments. 3088750594 ? Social Security Administration: May apply for disability if have a Stage IV cancer. 4797875167 605-452-0241 ? [employer], Disability and Transit Services: Assists with nutrition, care and transit needs. 2344081182

## 2015-11-17 ENCOUNTER — Other Ambulatory Visit: Payer: Self-pay | Admitting: General Surgery

## 2015-11-18 ENCOUNTER — Ambulatory Visit (HOSPITAL_COMMUNITY): Payer: Medicaid Other

## 2015-11-25 ENCOUNTER — Other Ambulatory Visit: Payer: Self-pay | Admitting: General Surgery

## 2015-11-25 ENCOUNTER — Other Ambulatory Visit: Payer: Self-pay | Admitting: Radiology

## 2015-11-26 ENCOUNTER — Ambulatory Visit (HOSPITAL_COMMUNITY)
Admission: RE | Admit: 2015-11-26 | Discharge: 2015-11-26 | Disposition: A | Discharge status: Home / Self Care | Payer: Medicaid Other | Source: Ambulatory Visit | Attending: Oncology | Admitting: Oncology

## 2015-11-26 ENCOUNTER — Encounter (HOSPITAL_COMMUNITY): Payer: Self-pay

## 2015-11-26 ENCOUNTER — Other Ambulatory Visit (HOSPITAL_COMMUNITY): Payer: Self-pay | Admitting: Oncology

## 2015-11-26 DIAGNOSIS — M898X5 Other specified disorders of bone, thigh: Secondary | ICD-10-CM | POA: Insufficient documentation

## 2015-11-26 DIAGNOSIS — Z8571 Personal history of Hodgkin lymphoma: Secondary | ICD-10-CM | POA: Diagnosis not present

## 2015-11-26 DIAGNOSIS — Z9221 Personal history of antineoplastic chemotherapy: Secondary | ICD-10-CM | POA: Diagnosis not present

## 2015-11-26 LAB — BONE MARROW EXAM

## 2015-11-26 LAB — PROTIME-INR
INR: 1.01 (ref 0.00–1.49)
Prothrombin Time: 13.5 seconds (ref 11.6–15.2)

## 2015-11-26 LAB — CBC
HEMATOCRIT: 38.8 % — AB (ref 39.0–52.0)
Hemoglobin: 12.8 g/dL — ABNORMAL LOW (ref 13.0–17.0)
MCH: 28.3 pg (ref 26.0–34.0)
MCHC: 33 g/dL (ref 30.0–36.0)
MCV: 85.8 fL (ref 78.0–100.0)
Platelets: 171 10*3/uL (ref 150–400)
RBC: 4.52 MIL/uL (ref 4.22–5.81)
RDW: 15.5 % (ref 11.5–15.5)
WBC: 5.1 10*3/uL (ref 4.0–10.5)

## 2015-11-26 LAB — APTT: APTT: 86 s — AB (ref 24–37)

## 2015-11-26 MED ORDER — SODIUM CHLORIDE 0.9 % IV SOLN
INTRAVENOUS | Status: DC
  Administered 2015-11-26: 07:00:00 via INTRAVENOUS

## 2015-11-26 MED ORDER — SODIUM CHLORIDE 0.9% FLUSH
10.0000 mL | Freq: Once | INTRAVENOUS | Status: AC
  Administered 2015-11-26: 10 mL via INTRAVENOUS

## 2015-11-26 MED ORDER — FENTANYL CITRATE (PF) 100 MCG/2ML IJ SOLN
INTRAMUSCULAR | Status: AC | PRN
  Administered 2015-11-26 (×2): 25 ug via INTRAVENOUS
  Administered 2015-11-26: 50 ug via INTRAVENOUS

## 2015-11-26 MED ORDER — HEPARIN SOD (PORK) LOCK FLUSH 100 UNIT/ML IV SOLN
500.0000 [IU] | INTRAVENOUS | Status: AC | PRN
  Administered 2015-11-26: 500 [IU]
  Filled 2015-11-26: qty 5

## 2015-11-26 MED ORDER — MIDAZOLAM HCL 2 MG/2ML IJ SOLN
INTRAMUSCULAR | Status: AC | PRN
  Administered 2015-11-26 (×4): 0.5 mg via INTRAVENOUS
  Administered 2015-11-26: 1 mg via INTRAVENOUS

## 2015-11-26 MED ORDER — FENTANYL CITRATE (PF) 100 MCG/2ML IJ SOLN
INTRAMUSCULAR | Status: AC
  Filled 2015-11-26: qty 2

## 2015-11-26 MED ORDER — MIDAZOLAM HCL 2 MG/2ML IJ SOLN
INTRAMUSCULAR | Status: AC
  Filled 2015-11-26: qty 4

## 2015-11-26 NOTE — Sedation Documentation (Signed)
Patient denies pain and is resting comfortably.  

## 2015-11-26 NOTE — Discharge Instructions (Signed)
Moderate Conscious Sedation, Adult, Care After °Refer to this sheet in the next few weeks. These instructions provide you with information on caring for yourself after your procedure. Your health care provider may also give you more specific instructions. Your treatment has been planned according to current medical practices, but problems sometimes occur. Call your health care provider if you have any problems or questions after your procedure. °WHAT TO EXPECT AFTER THE PROCEDURE  °After your procedure: °· You may feel sleepy, clumsy, and have poor balance for several hours. °· Vomiting may occur if you eat too soon after the procedure. °HOME CARE INSTRUCTIONS °· Do not participate in any activities where you could become injured for at least 24 hours. Do not: °¨ Drive. °¨ Swim. °¨ Ride a bicycle. °¨ Operate heavy machinery. °¨ Cook. °¨ Use power tools. °¨ Climb ladders. °¨ Work from a high place. °· Do not make important decisions or sign legal documents until you are improved. °· If you vomit, drink water, juice, or soup when you can drink without vomiting. Make sure you have little or no nausea before eating solid foods. °· Only take over-the-counter or prescription medicines for pain, discomfort, or fever as directed by your health care provider. °· Make sure you and your family fully understand everything about the medicines given to you, including what side effects may occur. °· You should not drink alcohol, take sleeping pills, or take medicines that cause drowsiness for at least 24 hours. °· If you smoke, do not smoke without supervision. °· If you are feeling better, you may resume normal activities 24 hours after you were sedated. °· Keep all appointments with your health care provider. °SEEK MEDICAL CARE IF: °· Your skin is pale or bluish in color. °· You continue to feel nauseous or vomit. °· Your pain is getting worse and is not helped by medicine. °· You have bleeding or swelling. °· You are still  sleepy or feeling clumsy after 24 hours. °SEEK IMMEDIATE MEDICAL CARE IF: °· You develop a rash. °· You have difficulty breathing. °· You develop any type of allergic problem. °· You have a fever. °MAKE SURE YOU: °· Understand these instructions. °· Will watch your condition. °· Will get help right away if you are not doing well or get worse. °  °This information is not intended to replace advice given to you by your health care provider. Make sure you discuss any questions you have with your health care provider. °  °Document Released: 06/06/2013 Document Revised: 09/06/2014 Document Reviewed: 06/06/2013 °[employer]vier Interactive Patient Education ©2016 [employer] °Bone Marrow Aspiration and Bone Marrow Biopsy, Care After °Refer to this sheet in the next few weeks. These instructions provide you with information about caring for yourself after your procedure. Your health care provider may also give you more specific instructions. Your treatment has been planned according to current medical practices, but problems sometimes occur. Call your health care provider if you have any problems or questions after your procedure. °WHAT TO EXPECT AFTER THE PROCEDURE °After your procedure, it is common to have: °· Soreness or tenderness around the puncture site. °· Bruising. °HOME CARE INSTRUCTIONS °· Take medicines only as directed by your health care provider. °· Follow your health care provider's instructions about: °¨ Puncture site care. °¨ Bandage (dressing) changes and removal. °· Bathe and shower as directed by your health care provider. °· Check your puncture site every day for signs of infection. Watch for: °¨ Redness, swelling, or pain. °¨   Fluid, blood, or pus. °· Return to your normal activities as directed by your health care provider. °· Keep all follow-up visits as directed by your health care provider. This is important. °SEEK MEDICAL CARE IF: °· You have a fever. °· You have uncontrollable bleeding. °· You have  redness, swelling, or pain at the site of your puncture. °· You have fluid, blood, or pus coming from your puncture site. °  °This information is not intended to replace advice given to you by your health care provider. Make sure you discuss any questions you have with your health care provider. °  °Document Released: 03/05/2005 Document Revised: 12/31/2014 Document Reviewed: 08/07/2014 °Elsevier Interactive Patient Education ©2016 [employer] ° °

## 2015-11-26 NOTE — H&P (Signed)
Referring Physician(s): Robynn Pane S/Penland,Shannon  Supervising Physician: Sandi Mariscal  Chief Complaint:  "I'm here for a biopsy "  Subjective:  Pt familiar to IR service from prior bone marrow biopsy on 01/21/15. He has known history of stage III Hodgkin's lymphoma with prior chemotherapy. Recent MRI of the pelvis has revealed multiple stable lytic enhancing lesions in the iliac bones and sacrum , unchanged since 12/20/14. He presents again today for CT-guided bone marrow/lytic lesion biopsy for further evaluation. He currently denies fever, headache, chest pain, dyspnea, cough, abdominal pain, back pain, nausea, vomiting or abnormal bleeding.   Allergies: Clindamycin/lincomycin  Medications: Prior to Admission medications   Medication Sig Start Date End Date Taking? Authorizing Provider  LORazepam (ATIVAN) 0.5 MG tablet Take 1 tablet (0.5 mg total) by mouth every 8 (eight) hours as needed for anxiety. 01/24/15  Yes Baird Cancer, PA-C  amitriptyline (ELAVIL) 25 MG tablet Take one tablet at bedtime for 5 days then increase to two tablets thereafter 04/15/15   Patrici Ranks, MD  citalopram (CELEXA) 20 MG tablet Take 1 tablet daily x 7 days. Then increase to 2 tablets daily. 03/04/15   Patrici Ranks, MD  lidocaine-prilocaine (EMLA) cream Apply a quarter size amount to port site 1 hour prior to chemo. Do not rub in. Cover with plastic wrap. 05/27/15   Patrici Ranks, MD  omeprazole (PRILOSEC) 40 MG capsule Take 1 capsule (40 mg total) by mouth daily. 03/04/15   Patrici Ranks, MD  ondansetron (ZOFRAN) 8 MG tablet Take 1 tablet (8 mg total) by mouth every 8 (eight) hours as needed for nausea or vomiting. Patient not taking: Reported on 09/02/2015 06/13/15   Baird Cancer, PA-C  oxyCODONE-acetaminophen (PERCOCET/ROXICET) 5-325 MG tablet Take 1-2 tablets by mouth every 6 (six) hours as needed for severe pain. Take for Neulasta bone pain 08/29/15   Manon Hilding Kefalas, PA-C    prochlorperazine (COMPAZINE) 10 MG tablet Take 1 tablet (10 mg total) by mouth every 6 (six) hours as needed for nausea or vomiting. Patient not taking: Reported on 05/27/2015 01/24/15   Patrici Ranks, MD     Vital Signs:Blood pressure 121/80, temperature 97.7, heart rate 62, respirations 16, O2 saturations 100% room air   Physical Exam patient awake, alert. Chest clear to auscultation bilaterally; clean, intact left chest wall Port-A-Cath. Heart with regular rate and rhythm. Abdomen soft, positive bowel sounds, nontender. Lower extremities with no edema.  Imaging: No results found.  Labs:  CBC:  Recent Labs  06/10/15 0924 06/24/15 1000 07/08/15 0916 11/26/15 0720  WBC 2.8* 6.7 3.0* 5.1  HGB 11.9* 12.1* 11.5* 12.8*  HCT 36.3* 37.2* 35.9* 38.8*  PLT 215 186 273 171    COAGS:  Recent Labs  01/21/15 1000 11/26/15 0720  INR 1.15 1.01  APTT  --  86*    BMP:  Recent Labs  05/27/15 0912 06/10/15 0924 06/24/15 1000 07/08/15 0916  NA 140 140 139 141  K 4.1 3.4* 4.1 4.1  CL 106 103 105 106  CO2 _0 GLUCOSE 105* 125* 88 117*  BUN _1 CALCIUM 8.5* 8.3* 9.0 9.0  CREATININE 1.00 1.05 1.12 1.05  GFRNONAA >60 >60 >60 >60  GFRAA >60 >60 >60 >60    LIVER FUNCTION TESTS:  Recent Labs  05/27/15 0912 06/10/15 0924 06/24/15 1000 07/08/15 0916  BILITOT 0.3 0.6 0.5 0.3  AST _2 ALT 16* 15* 13*  15*  ALKPHOS 38 40 47 37*  PROT 7.5 7.3 7.4 7.2  ALBUMIN 3.6 3.7 3.8 3.6    Assessment and Plan:  Pt with history of stage III Hodgkin's lymphoma with prior chemotherapy. Recent MRI of the pelvis has revealed multiple stable lytic enhancing lesions in the iliac bones and sacrum , unchanged since 12/20/14. He presents again today for CT-guided bone marrow/lytic lesion biopsy for further evaluation.Risks and benefits discussed with the patient including, but not limited to bleeding, infection, damage to adjacent structures or low yield requiring  additional tests. All of the patient's questions were answered, patient is agreeable to proceed.Consent signed and in chart.     Electronically Signed: D. Rowe Robert 11/26/2015, 8:32 AM   I spent a total of 15 minutes at the the patient's bedside AND on the patient's hospital floor or unit, greater than 50% of which was counseling/coordinating care for CT guided bone marrow /lytic lesion biopsy

## 2015-11-26 NOTE — Procedures (Signed)
Technically successful CT guided bone marrow aspiration and biopsy of left iliac crest. Technically successful CT guided biopsy of lytic lesion within the left iliac crest. EBL: None No immediate complications.    SignedSandi Mariscal [phone] 11/26/2015, 9:47 AM

## 2015-12-08 LAB — CHROMOSOME ANALYSIS, BONE MARROW

## 2015-12-17 ENCOUNTER — Encounter (HOSPITAL_COMMUNITY): Payer: Self-pay

## 2015-12-17 ENCOUNTER — Other Ambulatory Visit (HOSPITAL_COMMUNITY): Payer: Self-pay

## 2015-12-19 ENCOUNTER — Encounter (HOSPITAL_COMMUNITY): Payer: Medicaid Other

## 2015-12-23 ENCOUNTER — Other Ambulatory Visit (HOSPITAL_COMMUNITY): Payer: Self-pay | Admitting: Oncology

## 2015-12-23 DIAGNOSIS — C8100 Nodular lymphocyte predominant Hodgkin lymphoma, unspecified site: Secondary | ICD-10-CM

## 2016-01-05 ENCOUNTER — Ambulatory Visit (HOSPITAL_COMMUNITY)
Admission: RE | Admit: 2016-01-05 | Discharge: 2016-01-05 | Disposition: A | Discharge status: Home / Self Care | Payer: Medicaid Other | Source: Ambulatory Visit | Attending: Oncology | Admitting: Oncology

## 2016-01-05 DIAGNOSIS — C8100 Nodular lymphocyte predominant Hodgkin lymphoma, unspecified site: Secondary | ICD-10-CM | POA: Diagnosis not present

## 2016-01-05 MED ORDER — DIATRIZOATE MEGLUMINE & SODIUM 66-10 % PO SOLN
ORAL | Status: AC
  Filled 2016-01-05: qty 30

## 2016-01-05 MED ORDER — IOPAMIDOL (ISOVUE-300) INJECTION 61%
100.0000 mL | Freq: Once | INTRAVENOUS | Status: AC | PRN
  Administered 2016-01-05: 100 mL via INTRAVENOUS

## 2016-02-05 ENCOUNTER — Encounter (HOSPITAL_COMMUNITY): Payer: Medicaid Other | Attending: Hematology & Oncology | Admitting: Hematology & Oncology

## 2016-02-05 ENCOUNTER — Encounter (HOSPITAL_BASED_OUTPATIENT_CLINIC_OR_DEPARTMENT_OTHER): Payer: Medicaid Other

## 2016-02-05 VITALS — BP 101/72 | HR 82 | Temp 98.2°F | Resp 20 | Wt 178.8 lb

## 2016-02-05 DIAGNOSIS — C8115 Nodular sclerosis classical Hodgkin lymphoma, lymph nodes of inguinal region and lower limb: Secondary | ICD-10-CM

## 2016-02-05 DIAGNOSIS — Z95828 Presence of other vascular implants and grafts: Secondary | ICD-10-CM | POA: Insufficient documentation

## 2016-02-05 DIAGNOSIS — Z720 Tobacco use: Secondary | ICD-10-CM

## 2016-02-05 DIAGNOSIS — D7822 Postprocedural hemorrhage and hematoma of the spleen following other procedure: Secondary | ICD-10-CM | POA: Diagnosis not present

## 2016-02-05 DIAGNOSIS — R700 Elevated erythrocyte sedimentation rate: Secondary | ICD-10-CM

## 2016-02-05 DIAGNOSIS — E8809 Other disorders of plasma-protein metabolism, not elsewhere classified: Secondary | ICD-10-CM | POA: Diagnosis not present

## 2016-02-05 DIAGNOSIS — C8100 Nodular lymphocyte predominant Hodgkin lymphoma, unspecified site: Secondary | ICD-10-CM | POA: Diagnosis not present

## 2016-02-05 DIAGNOSIS — C8105 Nodular lymphocyte predominant Hodgkin lymphoma, lymph nodes of inguinal region and lower limb: Secondary | ICD-10-CM

## 2016-02-05 DIAGNOSIS — M898X5 Other specified disorders of bone, thigh: Secondary | ICD-10-CM

## 2016-02-05 LAB — CBC WITH DIFFERENTIAL/PLATELET
BASOS ABS: 0 10*3/uL (ref 0.0–0.1)
Basophils Relative: 0 %
Eosinophils Absolute: 0.2 10*3/uL (ref 0.0–0.7)
Eosinophils Relative: 3 %
HEMATOCRIT: 42.1 % (ref 39.0–52.0)
Hemoglobin: 13.7 g/dL (ref 13.0–17.0)
LYMPHS ABS: 2.1 10*3/uL (ref 0.7–4.0)
LYMPHS PCT: 45 %
MCH: 29 pg (ref 26.0–34.0)
MCHC: 32.5 g/dL (ref 30.0–36.0)
MCV: 89 fL (ref 78.0–100.0)
MONO ABS: 0.6 10*3/uL (ref 0.1–1.0)
Monocytes Relative: 13 %
NEUTROS ABS: 1.9 10*3/uL (ref 1.7–7.7)
Neutrophils Relative %: 39 %
Platelets: 189 10*3/uL (ref 150–400)
RBC: 4.73 MIL/uL (ref 4.22–5.81)
RDW: 15 % (ref 11.5–15.5)
WBC: 4.8 10*3/uL (ref 4.0–10.5)

## 2016-02-05 LAB — COMPREHENSIVE METABOLIC PANEL
ALT: 15 U/L — AB (ref 17–63)
AST: 22 U/L (ref 15–41)
Albumin: 4 g/dL (ref 3.5–5.0)
Alkaline Phosphatase: 51 U/L (ref 38–126)
Anion gap: 7 (ref 5–15)
BILIRUBIN TOTAL: 0.5 mg/dL (ref 0.3–1.2)
BUN: 13 mg/dL (ref 6–20)
CALCIUM: 8.9 mg/dL (ref 8.9–10.3)
CO2: 27 mmol/L (ref 22–32)
CREATININE: 1.1 mg/dL (ref 0.61–1.24)
Chloride: 103 mmol/L (ref 101–111)
Glucose, Bld: 79 mg/dL (ref 65–99)
Potassium: 3.9 mmol/L (ref 3.5–5.1)
Sodium: 137 mmol/L (ref 135–145)
TOTAL PROTEIN: 8.6 g/dL — AB (ref 6.5–8.1)

## 2016-02-05 LAB — LACTATE DEHYDROGENASE: LDH: 172 U/L (ref 98–192)

## 2016-02-05 MED ORDER — HEPARIN SOD (PORK) LOCK FLUSH 100 UNIT/ML IV SOLN
500.0000 [IU] | Freq: Once | INTRAVENOUS | Status: AC
  Administered 2016-02-05: 500 [IU] via INTRAVENOUS

## 2016-02-05 MED ORDER — HEPARIN SOD (PORK) LOCK FLUSH 100 UNIT/ML IV SOLN
INTRAVENOUS | Status: AC
  Filled 2016-02-05: qty 5

## 2016-02-05 MED ORDER — SODIUM CHLORIDE 0.9% FLUSH
10.0000 mL | Freq: Once | INTRAVENOUS | Status: AC
  Administered 2016-02-05: 10 mL via INTRAVENOUS

## 2016-02-05 NOTE — Progress Notes (Signed)
[facility] at [facility] NOTE  Patient Care Team: No Pcp Per Patient as PCP - General (General Practice)  CHIEF COMPLAINTS/PURPOSE OF CONSULTATION:  Hodgkin lymphoma Staging form: Lymphoid Neoplasms, AJCC 6th Edition Clinical stage from 03/04/2015: Stage III - Unsigned Staging comments:  Questionable involvement of lingula on PET imaging. BMBX negative. PET with mild hyper metabolism with borderline enlarged bilateral axillar and mediastinal nodes. Bilateral inguinal nodes.   NOTE: patient has lytic lesion in the pelvis that are NOT PET avid. Not biopsied  L inquinal LN biopsy on 01/13/2015 with final pathology showing Classical Hodgkin Lymphoma, nodular sclerosing type Bone marrow biopsy 01/20/2015 without evidence of malignancy    Hodgkin lymphoma ([place] [facility])   01/13/2015 Initial Biopsy L inguinal LN biopsy on 01/13/2015 with Classical Hodgkin Lymphoma, nodular sclerosing type   01/20/2015 Bone Marrow Biopsy normal BMBX, mild polyclonal plasmacytosis   01/28/2015 Imaging PET/CT Mild hyper metabolism is associated with the borderline enlarged bilateral axillary and mediastinal lymph nodes. There is more intense FDG uptake associated with bilateral inguinal lymph nodes. focal area of peripheral consolidation in the lingula.   01/29/2015 Imaging MUGA with EF 69%   01/29/2015 Imaging 1.spirometry is normal .no change with bronchodilator. lung volumes are normal. airway resistance somewhat high. DLCO normal decreased MIP/MEP may indicate weakness of the bellows function of the lung.    02/04/2015 - 07/08/2015 Chemotherapy ABVD x 6 cycles   03/28/2015 PET scan No adenopathy or abnormal hypermetabolic activity along the previous axillary, mediastinal, and inguinal lymph node chains. Deauville 1.   03/31/2015 Imaging MUGA- Normal LEFT ventricular ejection fraction of 70%, not significantly changed from the 69% calculated on the previous study of 01/29/2015.   05/30/2015 Imaging MUGA- No  significant change in ejection fraction equal 73%   07/29/2015 Imaging PET/CT no specific evidence of recurrent metabolically active Hodgkin's   07/29/2015 Remission    09/10/2015 Imaging MRI pelvis- Multiple stable lytic slightly enhancing lesions in the iliac bones and sacrum, unchanged since 12/20/2014. Even though biopsy was negative for myeloma in May 2016, I think the patient remains at increased risk for development of myeloma.   11/26/2015 Pathology Results BMBX negative, polyclonal plasmacytosis noted   01/05/2016 Imaging CT neck- No evidence of recurrent lymphoma in the neck.   01/05/2016 Imaging CT CAP- Small bilateral axillary nodes and AP window mediastinal node, within normal limits and non FDG avid on prior PET. No suspicious lymphadenopathy in the chest, abdomen, or pelvis by CT size criteria. Spleen is normal in size.      HISTORY OF PRESENTING ILLNESS:  Doctor K [facility] 45 y.o. male is here for additional follow-up of Hodgkin Lymphoma.  Mr. [facility] returns to the [facility] alone today. In general, he says he's "doing okay." He confirms having his scans last month. He says he's been focused on himself and other than that has "no complaints."  He remarks that his appetite is so-so, off and on, at "about 70." In general, Mr. [facility] notes that the after-effect feelings from chemo is gone; that the numbness is gone from his fingers, mostly. He repeats that he's taking it easy day to day, trying not to worry about things he can't change. He confirms that he's staying busy and that he's "kinda sorta trying to" exercise. He has a friend trying to help get his spirit back, and does not feel he needs a support group at this time.  He says he smokes about a pack a week, but trying  to cut back. He notes that his son wants him to quit smoking as well.  He remarks that he still feels a little sore from his biopsy. The area of the biopsy is still a little swollen and healing, confirmed  during the physical exam.  He notes that he's involved in some kind of football group program which is keeping him occupied. He also recently got a puppy "to keep me going." He named the [facility] after the [facility].  He asks if he's due for a port flush today and does feel that his eyes are doing better today. He continues to follow with a retina specialist in [place].  He otherwise has no major complaints.    MEDICAL HISTORY:  Past Medical History  Diagnosis Date  . Bronchitis   . Lymphadenopathy, inguinal     left  . Hodgkin's lymphoma ([place])     SURGICAL HISTORY: Past Surgical History  Procedure Laterality Date  . Wrist surgery      right  . Knee surgery    . Incision and drainage peritonsillar abscess    . Lymph node biopsy Left 01/13/2015    Procedure: LEFT INGUINAL LYMPH NODE BIOPSY;  Surgeon: Robert Signs Md, MD;  Location: [facility];  Service: General;  Laterality: Left;  . Bone marrow biopsy Left 01/20/15  . Bone marrow aspiration Left 01/20/15  . Portacath placement Left 01/31/2015    Procedure: INSERTION PORT-A-CATH;  Surgeon: Robert Signs Md, MD;  Location: [facility];  Service: General;  Laterality: Left;    SOCIAL HISTORY: Social History   Social History  . Marital Status: Single    Spouse Name: N/A  . Number of Children: N/A  . Years of Education: N/A   Occupational History  . Not on file.   Social History Main Topics  . Smoking status: Current Every Day Smoker -- 0.50 packs/day for 2 years    Types: Cigarettes  . Smokeless tobacco: Never Used  . Alcohol Use: Yes     Comment: occasional  . Drug Use: Yes    Special: Marijuana     Comment: on occasion - last used 01/26/15  . Sexual Activity: Yes    Birth Control/ Protection: None   Other Topics Concern  . Not on file   Social History Narrative  Pt is a smoker starting 2008, pack of cigarettes a week. Infrequent drinking alcohol, maybe one or two once a month. Denies drug use other than  daily marijuana. In jail for 18 months.  Has 3 kids, 61 yo, 75 yo, and 85 yo. Has 1 grandchild. He has a car.   FAMILY HISTORY: Family History  Problem Relation Age of Onset  . Seizures Mother   . Diabetes Father   Father is 55 yo diabetic. Mother is 59 yo. Both still living. 1 brother, 4 sisters. One sister has breast cancer, diagnosed 1996 in her 48s. Another sister has severe depression. He has been with his girlfriend 9-10 years. He lives with his girlfriend and his father. He hasn't told his father about his diagnosis yet. One sister lives in [place] and she is aware of his diagnosis. He told two of his childhood friends about his diagnosis.   ALLERGIES:  is allergic to clindamycin/lincomycin.  MEDICATIONS:  Current Outpatient Prescriptions  Medication Sig Dispense Refill  . amitriptyline (ELAVIL) 25 MG tablet Take one tablet at bedtime for 5 days then increase to two tablets thereafter 60 tablet 3  . citalopram (CELEXA) 20  MG tablet Take 1 tablet daily x 7 days. Then increase to 2 tablets daily. 49 tablet 0  . lidocaine-prilocaine (EMLA) cream Apply a quarter size amount to port site 1 hour prior to chemo. Do not rub in. Cover with plastic wrap. 30 g 3  . LORazepam (ATIVAN) 0.5 MG tablet Take 1 tablet (0.5 mg total) by mouth every 8 (eight) hours as needed for anxiety. (Patient not taking: Reported on 02/05/2016) 30 tablet 1  . omeprazole (PRILOSEC) 40 MG capsule Take 1 capsule (40 mg total) by mouth daily. (Patient not taking: Reported on 02/05/2016) 30 capsule 3  . ondansetron (ZOFRAN) 8 MG tablet Take 1 tablet (8 mg total) by mouth every 8 (eight) hours as needed for nausea or vomiting. (Patient not taking: Reported on 09/02/2015) 60 tablet 1  . oxyCODONE-acetaminophen (PERCOCET/ROXICET) 5-325 MG tablet Take 1-2 tablets by mouth every 6 (six) hours as needed for severe pain. Take for Neulasta bone pain (Patient not taking: Reported on 02/05/2016) 45 tablet 0  . prochlorperazine  (COMPAZINE) 10 MG tablet Take 1 tablet (10 mg total) by mouth every 6 (six) hours as needed for nausea or vomiting. (Patient not taking: Reported on 05/27/2015) 60 tablet 1   No current facility-administered medications for this visit.    Review of Systems  Constitutional: Negative for weight loss and diaphoresis. Negative for fever, chills and malaise/fatigue.  HENT: Negative for congestion, ear discharge, ear pain, hearing loss, nosebleeds, sore throat and tinnitus.   Eyes: Negative for blurred vision. Negative for double vision, photophobia, pain, discharge and redness.  Respiratory: Negative.  Negative for stridor.   Cardiovascular: Negative.   Gastrointestinal: Negative for heartburn, nausea, vomiting, diarrhea, blood in stool and melena. Genitourinary: Negative for dysuria, frequency, hematuria and flank pain.  Musculoskeletal: Negative.   Skin: Negative.   Neurological: Negative for dizziness, tingling, tremors, sensory change, speech change, focal weakness, seizures, loss of consciousness and weakness.  Endo/Heme/Allergies: Negative.   Psychiatric/Behavioral:. Negative for depression, suicidal ideas, hallucinations and memory loss.  The patient is not nervous/anxious.    14 point ROS was done and is otherwise as detailed above or in HPI   PHYSICAL EXAMINATION: ECOG PERFORMANCE STATUS: 0 - Asymptomatic  Filed Vitals:   02/05/16 1030  BP: 101/72  Pulse: 82  Temp: 98.2 F (36.8 C)  Resp: 20   Filed Weights   02/05/16 1030  Weight: 178 lb 12.8 oz (81.103 kg)   Physical Exam  Constitutional: He is oriented to person, place, and time and well-developed, well-nourished, and in no distress.  HENT:  Head: Normocephalic and atraumatic.  Nose: Nose normal.  Mouth/Throat: Oropharynx is clear and moist. No oropharyngeal exudate.  Eyes: Conjunctivae and EOM are normal. Pupils are equal, round, and reactive to light. Right eye exhibits no discharge. Left eye exhibits no discharge.  No scleral icterus.  Neck: Normal range of motion. Neck supple. No tracheal deviation present. No thyromegaly present.  Cardiovascular: Normal rate, regular rhythm and normal heart sounds.  Exam reveals no gallop and no friction rub. No murmur heard. Lymphatics: NO palpable adenopathy in the neck, supraclavicular, axillary or inguinal regions. Pulmonary/Chest: Effort normal and breath sounds normal. He has no wheezes. He has no rales. Port a cath accessed L chest Abdominal: Soft. Bowel sounds are normal. He exhibits no distension and no mass. There is no tenderness. There is no rebound and no guarding.  Genitourinary:  deferred Musculoskeletal: Normal range of motion. He exhibits no edema.   Neurological: He is alert  and oriented to person, place, and time. He has normal reflexes. No cranial nerve deficit. Gait normal. Coordination normal.  Skin: Skin is warm and dry. No rash noted.  Psychiatric: Mood, memory, affect and judgment normal.  Nursing note and vitals reviewed.   LABORATORY DATA:  I have reviewed the data as listed. Lab Results  Component Value Date   WBC 4.8 02/05/2016   HGB 13.7 02/05/2016   HCT 42.1 02/05/2016   MCV 89.0 02/05/2016   PLT 189 02/05/2016   CMP     Component Value Date/Time   NA 137 02/05/2016 1055   K 3.9 02/05/2016 1055   CL 103 02/05/2016 1055   CO2 27 02/05/2016 1055   GLUCOSE 79 02/05/2016 1055   BUN 13 02/05/2016 1055   CREATININE 1.10 02/05/2016 1055   CALCIUM 8.9 02/05/2016 1055   PROT 8.6* 02/05/2016 1055   ALBUMIN 4.0 02/05/2016 1055   AST 22 02/05/2016 1055   ALT 15* 02/05/2016 1055   ALKPHOS 51 02/05/2016 1055   BILITOT 0.5 02/05/2016 1055   GFRNONAA >60 02/05/2016 1055   GFRAA >60 02/05/2016 1055   Results for Robert, Acevedo (MRN [id number]) as of 02/09/2016 18:28  Ref. Range 02/05/2016 10:55  [facility] Latest Ref Range: 98-192 U/L 172     PATHOLOGY:  BMBXIAGNOSIS Diagnosis Bone Marrow, Aspirate,Biopsy, and Clot, left iliac -  NORMOCELLULAR BONE MARROW FOR AGE WITH TRILINEAGE HEMATOPOIESIS. - MILD POLYCLONAL PLASMACYTOSIS - NO TUMOR IDENTIFIED. PERIPHERAL BLOOD: - NORMOCYTIC-NORMOCHROMIC ANEMIA. Diagnosis Note There is no morphologic evidence of involvement by Hodgkin lymphoma in this material. Clinical correlation is recommended. (BNS:ecj 01/22/2015) Susanne Greenhouse MD Pathologist, Electronic Signature  LYMPH NODE BIOPSY Diagnosis Lymph node for lymphoma, left inguinal - CLASSICAL HODGKIN LYMPHOMA, NODULAR SCLEROSIS TYPE. - SEE ONCOLOGY TABLE. Microscopic Comment LYMPHOMA Histologic type: Classical Hodgkin lymphoma, nodular sclerosis type. Grade (if applicable): N/A. Flow cytometry: Not performed. Immunohistochemical stains: CD45, CD20, CD79a, CD3, CD30, and CD15. Touch preps/imprints: Mixed lymphoid population with scattered large cells with bi-/multi-nucleation and prominent nucleoli. Comments: Sections of lymph nodes reveal effacement of the architecture by a nodular lymphoreticular proliferation. There are abundant large cells with bi-/multi-nucleation and prominent nucleoli, consistent with Hodgkin Reed-Sternberg (HRS) cells. These occur both scattered and in large loose clusters. There are lacunar variants and mummified cells. Focally, there are broad bands of fibrosis creating a more nodular appearance. In these areas the background reveals small lymphocytes and plasma cells with scattered eosinophils and neutrophils, while less fibrotic areas have a more lymphocytic background. The smaller node is only partially involved with areas of residual reactive lymph node. Immunohistochemistry reveals the HRS cells are positive for CD30 and CD15. They are negative for CD20, CD79a, and CD45. CD20 and CD79 highlight residual B-cell areas. CD3 highlights abundant T-cells. The overall findings are consistent with classical Hodgkin lymphoma, nodular sclerosis type. The case was called to Drs. Jenkins and Denver on  01/15/2015. Vicente Males MD Pathologist, Electronic Signature (Case signed 01/15/2015) Specimen Gross and Clinical Information   RADIOGRAPHIC STUDIES: I have personally reviewed the radiological images as listed and agreed with the findings in the report. CLINICAL DATA: Hodgkin lymphoma status post chemotherapy.  EXAM: CT NECK WITH CONTRAST  TECHNIQUE: Multidetector CT imaging of the neck was performed using the standard protocol following the bolus administration of intravenous contrast.  CONTRAST: 157m ISOVUE-300 IOPAMIDOL (ISOVUE-300) INJECTION 61%  COMPARISON: PET-CT 07/29/2015  FINDINGS: Pharynx and larynx: Unremarkable without evidence of mass.  Salivary glands: Parotid and submandibular glands are unremarkable.  Thyroid: Unremarkable.  Lymph nodes: No enlarged lymph nodes identified in the neck.  Vascular: Major vascular structures in the neck are grossly patent.  Limited intracranial: Unremarkable.  Visualized orbits: Not imaged.  Mastoids and visualized paranasal sinuses: Minimal mucosal thickening/ small mucous retention cysts partially visualized in the right maxillary sinus. Visualized mastoid air cells clear.  Skeleton: No suspicious lytic or blastic osseous lesion identified. Moderate multilevel disc space narrowing and endplate osteophytosis in the lower cervical spine.  Upper chest: Evaluated on concurrent dedicated chest CT.  IMPRESSION: No evidence of recurrent lymphoma in the neck.   Electronically Signed  By: Logan Bores M.D.  On: 01/05/2016 14:03  CLINICAL DATA: Follow-up Hodgkin's lymphoma  EXAM: CT CHEST, ABDOMEN, AND PELVIS WITH CONTRAST  TECHNIQUE: Multidetector CT imaging of the chest, abdomen and pelvis was performed following the standard protocol during bolus administration of intravenous contrast.  CONTRAST: 192m ISOVUE-300 IOPAMIDOL (ISOVUE-300) INJECTION 61%  COMPARISON: PET-CT dated  07/29/2015  FINDINGS: CT CHEST FINDINGS  Mediastinum/Nodes: Heart is normal in size. Trace pericardial effusion inferiorly, unchanged.  No evidence of thoracic aortic aneurysm.  No suspicious mediastinal lymphadenopathy. 7 mm short axis AP window node (series 5/ image 26), grossly unchanged, non FDG avid. Multiple bilateral axillary nodes measuring up to 6 mm short axis, grossly unchanged, non FDG avid.  Visualized thyroid is unremarkable.  Left chest port terminates in the mid SVC.  Lungs/Pleura: Lungs are essentially clear.  No suspicious pulmonary nodules. Two 4 mm probable triangular subpleural lymph nodes in the posterior right upper lobe (series 9/image 74), unchanged, likely benign.  Mild paraseptal emphysematous changes.  Mild subpleural reticulation/ fibrosis in the bilateral lower lobes, with superimposed dependent atelectasis.  No pleural effusion or pneumothorax.  Musculoskeletal: No focal osseous lesions. Mild degenerative changes of the lower thoracic spine.  CT ABDOMEN PELVIS FINDINGS  Hepatobiliary: Liver is within normal limits.  Gallbladder is underdistended but unremarkable. No intrahepatic or extrahepatic ductal dilatation.  Pancreas: Within normal limits.  Spleen: Normal in size.  Adrenals/Urinary Tract: Adrenal glands are within normal limits.  Kidneys are within normal limits. No hydronephrosis.  Bladder is mildly thick-walled although underdistended.  Stomach/Bowel: Stomach is within normal limits.  No evidence of bowel obstruction.  Normal appendix (series 5/ image 102).  Vascular/Lymphatic: No evidence of abdominal aortic aneurysm.  Retro aortic left renal vein.  No suspicious abdominopelvic lymphadenopathy.  Reproductive: Prostate is unremarkable.  Other: No abdominopelvic ascites.  Musculoskeletal: Visualized osseous structures are within normal limits.  IMPRESSION: Small bilateral  axillary nodes and AP window mediastinal node, within normal limits and non FDG avid on prior PET.  No suspicious lymphadenopathy in the chest, abdomen, or pelvis by CT size criteria.  Spleen is normal in size.   Electronically Signed  By: SJulian HyM.D.  On: 01/05/2016 15:51  ASSESSMENT & PLAN:  Hodgkin Lymphoma, Nodular sclerosing type, Stage III-2A (? involvement in lingula) Abnormal CT of the pelvis with lytic bone disease, not PET avid. Biopsy negative (polyclonal plasmacytosis) Polyclonal gammopathy Elevated ESR IPS Score: 5 year freedom from progression and OS of 2; 80% FFP and 91% OS [place] 1 Tobacco Use  We reviewed his imaging. We reviewed his BMBX.  We reviewed ongoing surveillance of his Hodgkin lymphoma. I have recommended repeat imaging 6 months from his last studies therefore he will be due again in November. RTC 3 months with repeat physical exam and labs.  I addressed the importance of smoking cessation with  the patient in detail.  We discussed the health benefits of cessation.  We discussed the health detriments of ongoing tobacco use including but not limited to COPD, heart disease and malignancy. We reviewed the multiple options for cessation and I offered to refer him to smoking cessation classes here at [facility]. We discussed other alternatives to quit such as chantix, wellbutrin. We will continue to address this moving forward.  NCCN guidelines for surveillance of Hodgkin's Lymphoma (Age >/= 28) (1.2017):  A. Complete remission should be documented including reversion of PET to "negative" within 3 months following completion of therapy.  B. It is recommended that the patient be provided with a treatment summary at the completion of his/her therapy, including details of radiation therapy, organs at risk, and cumulative anthracycline dosage given.  C. Follow-up with an oncologist is recommended,  especially during the first 5 years after treatment to detect recurrence, and then annually due to the risk of late complications including second cancers and cardiovascular disease. Late relapse or transformation to large cell lymphoma may occur in [place].  D. Frequency and types of tests may vary depending on clinical circumstances: age and stage at diagnosis, social habits, treatment modality, etc. There are few data to support specific recommendations; these represent the range of practice at [facility].   1. Follow-up after completion of treatment up to 5 years:   A. Interim H and P: Every 3-6 months for 1-2 years, then every 6-12 months until year 3, then annually.   B. Annual influenza vaccine.   C. Laboratory studies:    1. CBC, platelets, ESR (if elevated at time of initial diagnosis), chemistry profile as clinically indicated.    2. Thyroid stimulating hormone (TSH) at least annually if radiation therapy to neck.   D. Acceptable to obtain a neck/chest/abdomen/pelvis CT scan with contrast, at 6, 12, and 24 month following completion of therapy, or as clinically indicated. PET/CT only if last PET was Deauville 4-5, to confirm complete response.   E. Counseling: Reproduction, health habits, psychosocial, cardiovascular, breast self-exam, skin cancer risk, end of treatment discussion.   F. Surveillance PET should not be done routinely due to risk of false positives. Management decision should not be based on PET scan alone; clinical or pathologic correlation is needed.   Orders Placed This Encounter  Procedures  . CBC with Differential    Standing Status: Future     Number of Occurrences: 1     Standing Expiration Date: 02/04/2017  . Comprehensive metabolic panel    Standing Status: Future     Number of Occurrences: 1     Standing Expiration Date: 02/04/2017  . Lactate dehydrogenase    Standing Status: Future     Number of Occurrences: 1     Standing Expiration Date:  02/04/2017  . CBC with Differential    Standing Status: Future     Number of Occurrences:      Standing Expiration Date: 02/04/2017  . Comprehensive metabolic panel    Standing Status: Future     Number of Occurrences:      Standing Expiration Date: 02/04/2017  . Lactate dehydrogenase    Standing Status: Future     Number of Occurrences:      Standing Expiration Date: 02/04/2017   All questions were answered. The patient knows to call the clinic with any problems, questions or concerns.   This note was electronically signed.    This document serves as a record of services personally performed  by Ancil Linsey, MD. It was created on her behalf by Toni Amend, a trained medical scribe. The creation of this record is based on the scribe's personal observations and the provider's statements to them. This document has been checked and approved by the attending provider.  I have reviewed the above documentation for accuracy and completeness, and I agree with the above.  Kelby Fam. Whitney Muse, MD

## 2016-02-05 NOTE — Patient Instructions (Addendum)
[place] at [facility] Discharge Instructions  RECOMMENDATIONS MADE BY THE CONSULTANT AND ANY TEST RESULTS WILL BE SENT TO YOUR REFERRING PHYSICIAN.   Return to clinic in 3 months with labs (labs through port)  Port flush today with labs done also  Thank you for choosing [place] at [facility] to provide your oncology and hematology care.  To afford each patient quality time with our provider, please arrive at least 15 minutes before your scheduled appointment time.   Beginning January 23rd 2017 lab work for the [facility] will be done in the  Main lab at [facility] on 1st floor. If you have a lab appointment with the [place] please come in thru the  Main Entrance and check in at the main information desk  You need to re-schedule your appointment should you arrive 10 or more minutes late.  We strive to give you quality time with our providers, and arriving late affects you and other patients whose appointments are after yours.  Also, if you no show three or more times for appointments you may be dismissed from the clinic at the providers discretion.     Again, thank you for choosing [facility].  Our hope is that these requests will decrease the amount of time that you wait before being seen by our physicians.       _____________________________________________________________  Should you have questions after your visit to [facility], please contact our office at [phone] between the hours of 8:30 a.m. and 4:30 p.m.  Voicemails left after 4:30 p.m. will not be returned until the following business day.  For prescription refill requests, have your pharmacy contact our office.         Resources For Cancer Patients and their Caregivers ? American Cancer Society: Can assist with transportation, wigs, general needs, runs Look Good Feel Better.        4321925594 ? Cancer Care: Provides  financial assistance, online support groups, medication/co-pay assistance.  1-800-813-HOPE (980)115-0264) ? [facility] Assists [place] cancer patients and their families through emotional , educational and financial support.  (405) 486-8466 ? [place] DSS Where to apply for food stamps, Medicaid and utility assistance. 256 402 6037 ? [employer]: Transportation to medical appointments. 620-413-2379 ? Social Security Administration: May apply for disability if have a Stage IV cancer. 7062840136 (531)532-7472 ? [employer], Disability and Transit Services: Assists with nutrition, care and transit needs. [place] Support Programs: @10RELATIVEDAYS @ > Cancer Support Group  2nd Tuesday of the month 1pm-2pm, Journey Room  > Creative Journey  3rd Tuesday of the month 1130am-1pm, Journey Room  > Look Good Feel Better  1st Wednesday of the month 10am-12 noon, Journey Room (Call [place] to register [phone])

## 2016-02-05 NOTE — Progress Notes (Signed)
Lord Remigio Eisenmenger presented for Portacath access and flush. Proper placement of portacath confirmed by CXR. Portacath located lt chest wall accessed with  H 20 needle. Good blood return present. Portacath flushed with 24ml NS and 500U/54ml Heparin and needle removed intact. Procedure without incident. Patient tolerated procedure well.

## 2016-02-09 ENCOUNTER — Encounter (HOSPITAL_COMMUNITY): Payer: Self-pay | Admitting: Hematology & Oncology

## 2016-03-18 ENCOUNTER — Encounter (HOSPITAL_COMMUNITY): Payer: Medicaid Other | Attending: Hematology & Oncology

## 2016-03-18 DIAGNOSIS — C8100 Nodular lymphocyte predominant Hodgkin lymphoma, unspecified site: Secondary | ICD-10-CM

## 2016-03-18 DIAGNOSIS — E8809 Other disorders of plasma-protein metabolism, not elsewhere classified: Secondary | ICD-10-CM | POA: Diagnosis not present

## 2016-03-18 DIAGNOSIS — C8115 Nodular sclerosis classical Hodgkin lymphoma, lymph nodes of inguinal region and lower limb: Secondary | ICD-10-CM | POA: Diagnosis not present

## 2016-03-18 DIAGNOSIS — Z452 Encounter for adjustment and management of vascular access device: Secondary | ICD-10-CM

## 2016-03-18 LAB — COMPREHENSIVE METABOLIC PANEL
ALT: 16 U/L — AB (ref 17–63)
AST: 22 U/L (ref 15–41)
Albumin: 3.7 g/dL (ref 3.5–5.0)
Alkaline Phosphatase: 49 U/L (ref 38–126)
Anion gap: 1 — ABNORMAL LOW (ref 5–15)
BUN: 14 mg/dL (ref 6–20)
CHLORIDE: 105 mmol/L (ref 101–111)
CO2: 30 mmol/L (ref 22–32)
Calcium: 8.4 mg/dL — ABNORMAL LOW (ref 8.9–10.3)
Creatinine, Ser: 1.19 mg/dL (ref 0.61–1.24)
Glucose, Bld: 93 mg/dL (ref 65–99)
POTASSIUM: 4.1 mmol/L (ref 3.5–5.1)
SODIUM: 136 mmol/L (ref 135–145)
Total Bilirubin: 0.4 mg/dL (ref 0.3–1.2)
Total Protein: 8.4 g/dL — ABNORMAL HIGH (ref 6.5–8.1)

## 2016-03-18 LAB — CBC WITH DIFFERENTIAL/PLATELET
BASOS ABS: 0 10*3/uL (ref 0.0–0.1)
Basophils Relative: 1 %
EOS ABS: 0.2 10*3/uL (ref 0.0–0.7)
EOS PCT: 3 %
HCT: 39.6 % (ref 39.0–52.0)
Hemoglobin: 13.2 g/dL (ref 13.0–17.0)
LYMPHS PCT: 42 %
Lymphs Abs: 2.2 10*3/uL (ref 0.7–4.0)
MCH: 29.9 pg (ref 26.0–34.0)
MCHC: 33.3 g/dL (ref 30.0–36.0)
MCV: 89.6 fL (ref 78.0–100.0)
MONO ABS: 0.7 10*3/uL (ref 0.1–1.0)
Monocytes Relative: 14 %
Neutro Abs: 2 10*3/uL (ref 1.7–7.7)
Neutrophils Relative %: 40 %
PLATELETS: 192 10*3/uL (ref 150–400)
RBC: 4.42 MIL/uL (ref 4.22–5.81)
RDW: 15.2 % (ref 11.5–15.5)
WBC: 5.1 10*3/uL (ref 4.0–10.5)

## 2016-03-18 LAB — SEDIMENTATION RATE: SED RATE: 36 mm/h — AB (ref 0–16)

## 2016-03-18 LAB — LACTATE DEHYDROGENASE: LDH: 166 U/L (ref 98–192)

## 2016-03-18 MED ORDER — HEPARIN SOD (PORK) LOCK FLUSH 100 UNIT/ML IV SOLN
500.0000 [IU] | Freq: Once | INTRAVENOUS | Status: AC
  Administered 2016-03-18: 500 [IU] via INTRAVENOUS

## 2016-03-18 MED ORDER — SODIUM CHLORIDE 0.9% FLUSH
20.0000 mL | INTRAVENOUS | Status: DC | PRN
  Administered 2016-03-18: 20 mL via INTRAVENOUS
  Filled 2016-03-18: qty 20

## 2016-03-18 NOTE — Progress Notes (Signed)
Robert Acevedo presented for Portacath access and flush. Proper placement of portacath confirmed by CXR. Portacath located left chest wall accessed with  H 20 needle. Good blood return present.  Specimen drawn for labs.   Portacath flushed with 46ml NS and 500U/57ml Heparin and needle removed intact. Procedure without incident. Patient tolerated procedure well.

## 2016-03-18 NOTE — Patient Instructions (Signed)
[facility] at [facility] Discharge Instructions  RECOMMENDATIONS MADE BY THE CONSULTANT AND ANY TEST RESULTS WILL BE SENT TO YOUR REFERRING PHYSICIAN.  Port flush with labs today.    Thank you for choosing [facility] at [facility] to provide your oncology and hematology care.  To afford each patient quality time with our provider, please arrive at least 15 minutes before your scheduled appointment time.   Beginning January 23rd 2017 lab work for the Cancer Center will be done in the  Main lab at [place] on 1st floor. If you have a lab appointment with the Cancer Center please come in thru the  Main Entrance and check in at the main information desk  You need to re-schedule your appointment should you arrive 10 or more minutes late.  We strive to give you quality time with our providers, and arriving late affects you and other patients whose appointments are after yours.  Also, if you no show three or more times for appointments you may be dismissed from the clinic at the providers discretion.     Again, thank you for choosing [facility].  Our hope is that these requests will decrease the amount of time that you wait before being seen by our physicians.       _____________________________________________________________  Should you have questions after your visit to  Cancer Center, please contact our office at [phone] between the hours of 8:30 a.m. and 4:30 p.m.  Voicemails left after 4:30 p.m. will not be returned until the following business day.  For prescription refill requests, have your pharmacy contact our office.         Resources For Cancer Patients and their Caregivers ? American Cancer Society: Can assist with transportation, wigs, general needs, runs Look Good Feel Better.        1-888-227-6333 ? Cancer Care: Provides financial assistance, online support groups, medication/co-pay assistance.   1-800-813-HOPE (4673) ? [facility] Assists [place] cancer patients and their families through emotional , educational and financial support.  336-427-4357 ? [place] DSS Where to apply for food stamps, Medicaid and utility assistance. 336-342-1394 ? [employer]: Transportation to medical appointments. 336-347-2287 ? Social Security Administration: May apply for disability if have a Stage IV cancer. 336-342-7796 1-800-772-1213 ? [place] [employer], Disability and Transit Services: Assists with nutrition, care and transit needs. 336-349-2343  Cancer Center Support Programs: @10RELATIVEDAYS@ > Cancer Support Group  2nd Tuesday of the month 1pm-2pm, Journey Room  > Creative Journey  3rd Tuesday of the month 1130am-1pm, Journey Room  > Look Good Feel Better  1st Wednesday of the month 10am-12 noon, Journey Room (Call [employer] to register 1-800-395-5775)    

## 2016-04-29 ENCOUNTER — Encounter (HOSPITAL_COMMUNITY): Payer: Medicaid Other | Attending: Hematology & Oncology | Admitting: Hematology & Oncology

## 2016-04-29 ENCOUNTER — Encounter (HOSPITAL_COMMUNITY): Payer: Self-pay | Admitting: Hematology & Oncology

## 2016-04-29 ENCOUNTER — Encounter (HOSPITAL_BASED_OUTPATIENT_CLINIC_OR_DEPARTMENT_OTHER): Payer: Medicaid Other

## 2016-04-29 VITALS — BP 111/59 | HR 78 | Temp 98.5°F | Resp 16 | Wt 179.0 lb

## 2016-04-29 DIAGNOSIS — Z8579 Personal history of other malignant neoplasms of lymphoid, hematopoietic and related tissues: Secondary | ICD-10-CM | POA: Diagnosis not present

## 2016-04-29 DIAGNOSIS — D890 Polyclonal hypergammaglobulinemia: Secondary | ICD-10-CM

## 2016-04-29 DIAGNOSIS — C8100 Nodular lymphocyte predominant Hodgkin lymphoma, unspecified site: Secondary | ICD-10-CM

## 2016-04-29 DIAGNOSIS — R21 Rash and other nonspecific skin eruption: Secondary | ICD-10-CM | POA: Diagnosis not present

## 2016-04-29 DIAGNOSIS — Z720 Tobacco use: Secondary | ICD-10-CM

## 2016-04-29 DIAGNOSIS — E8809 Other disorders of plasma-protein metabolism, not elsewhere classified: Secondary | ICD-10-CM | POA: Insufficient documentation

## 2016-04-29 DIAGNOSIS — D649 Anemia, unspecified: Secondary | ICD-10-CM

## 2016-04-29 DIAGNOSIS — M898X5 Other specified disorders of bone, thigh: Secondary | ICD-10-CM

## 2016-04-29 LAB — COMPREHENSIVE METABOLIC PANEL
ALT: 11 U/L — AB (ref 17–63)
ANION GAP: 4 — AB (ref 5–15)
AST: 18 U/L (ref 15–41)
Albumin: 3 g/dL — ABNORMAL LOW (ref 3.5–5.0)
Alkaline Phosphatase: 40 U/L (ref 38–126)
BUN: 13 mg/dL (ref 6–20)
CHLORIDE: 111 mmol/L (ref 101–111)
CO2: 26 mmol/L (ref 22–32)
CREATININE: 0.88 mg/dL (ref 0.61–1.24)
Calcium: 7.6 mg/dL — ABNORMAL LOW (ref 8.9–10.3)
Glucose, Bld: 76 mg/dL (ref 65–99)
Potassium: 3.3 mmol/L — ABNORMAL LOW (ref 3.5–5.1)
Sodium: 141 mmol/L (ref 135–145)
Total Bilirubin: 0.4 mg/dL (ref 0.3–1.2)
Total Protein: 7.1 g/dL (ref 6.5–8.1)

## 2016-04-29 LAB — CBC WITH DIFFERENTIAL/PLATELET
Basophils Absolute: 0 10*3/uL (ref 0.0–0.1)
Basophils Relative: 0 %
EOS PCT: 2 %
Eosinophils Absolute: 0.1 10*3/uL (ref 0.0–0.7)
HCT: 37.3 % — ABNORMAL LOW (ref 39.0–52.0)
Hemoglobin: 12 g/dL — ABNORMAL LOW (ref 13.0–17.0)
LYMPHS ABS: 2 10*3/uL (ref 0.7–4.0)
LYMPHS PCT: 44 %
MCH: 29 pg (ref 26.0–34.0)
MCHC: 32.2 g/dL (ref 30.0–36.0)
MCV: 90.1 fL (ref 78.0–100.0)
MONO ABS: 0.4 10*3/uL (ref 0.1–1.0)
MONOS PCT: 8 %
Neutro Abs: 2.1 10*3/uL (ref 1.7–7.7)
Neutrophils Relative %: 46 %
PLATELETS: 187 10*3/uL (ref 150–400)
RBC: 4.14 MIL/uL — AB (ref 4.22–5.81)
RDW: 14.8 % (ref 11.5–15.5)
WBC: 4.6 10*3/uL (ref 4.0–10.5)

## 2016-04-29 MED ORDER — SODIUM CHLORIDE 0.9% FLUSH
10.0000 mL | INTRAVENOUS | Status: DC | PRN
  Administered 2016-04-29: 10 mL via INTRAVENOUS
  Filled 2016-04-29: qty 10

## 2016-04-29 MED ORDER — HEPARIN SOD (PORK) LOCK FLUSH 100 UNIT/ML IV SOLN
500.0000 [IU] | Freq: Once | INTRAVENOUS | Status: AC
  Administered 2016-04-29: 500 [IU] via INTRAVENOUS

## 2016-04-29 MED ORDER — HEPARIN SOD (PORK) LOCK FLUSH 100 UNIT/ML IV SOLN
INTRAVENOUS | Status: AC
  Filled 2016-04-29: qty 5

## 2016-04-29 NOTE — Progress Notes (Signed)
[facility] [facility] [place] NOTE  Patient Care Team: No Pcp Per Patient as PCP - General (General Practice)  CHIEF COMPLAINTS/PURPOSE OF CONSULTATION:  Hodgkin lymphoma Staging form: Lymphoid Neoplasms, AJCC 6th Edition Clinical stage from 03/04/2015: Stage III - Unsigned Staging comments:  Questionable involvement of lingula on PET imaging. BMBX negative. PET with mild hyper metabolism with borderline enlarged bilateral axillar and mediastinal nodes. Bilateral inguinal nodes.   NOTE: patient has lytic lesion in the pelvis that are NOT PET avid. Not biopsied  L inquinal LN biopsy on 01/13/2015 with final pathology showing Classical Hodgkin Lymphoma, nodular sclerosing type Bone marrow biopsy 01/20/2015 without evidence of malignancy    Hodgkin lymphoma ([place])   01/13/2015 Initial Biopsy    L inguinal LN biopsy on 01/13/2015 with Classical Hodgkin Lymphoma, nodular sclerosing type      01/20/2015 Bone Marrow Biopsy    normal BMBX, mild polyclonal plasmacytosis      01/28/2015 Imaging    PET/CT Mild hyper metabolism is associated with the borderline enlarged bilateral axillary and mediastinal lymph nodes. There is more intense FDG uptake associated with bilateral inguinal lymph nodes. focal area of peripheral consolidation in the lingula.      01/29/2015 Imaging    MUGA with EF 69%      01/29/2015 Imaging    1.spirometry is normal .no change with bronchodilator. lung volumes are normal. airway resistance somewhat high. DLCO normal decreased MIP/MEP may indicate weakness of the bellows function of the lung.       02/04/2015 - 07/08/2015 Chemotherapy    ABVD x 6 cycles      03/28/2015 PET scan    No adenopathy or abnormal hypermetabolic activity along the previous axillary, mediastinal, and inguinal lymph node chains. Deauville 1.      03/31/2015 Imaging    MUGA- Normal LEFT ventricular ejection fraction of 70%, not significantly changed from the 69% calculated on  the previous study of 01/29/2015.      05/30/2015 Imaging    MUGA- No significant change in ejection fraction equal 73%      07/29/2015 Imaging    PET/CT no specific evidence of recurrent metabolically active Hodgkin's      07/29/2015 Remission         09/10/2015 Imaging    MRI pelvis- Multiple stable lytic slightly enhancing lesions in the iliac bones and sacrum, unchanged since 12/20/2014. Even though biopsy was negative for myeloma in May 2016, I think the patient remains at increased risk for development of myeloma.      11/26/2015 Pathology Results    BMBX negative, polyclonal plasmacytosis noted      01/05/2016 Imaging    CT neck- No evidence of recurrent lymphoma in the neck.      01/05/2016 Imaging    CT CAP- Small bilateral axillary nodes and AP window mediastinal node, within normal limits and non FDG avid on prior PET. No suspicious lymphadenopathy in the chest, abdomen, or pelvis by CT size criteria. Spleen is normal in size.         HISTORY OF PRESENTING ILLNESS:  Robert Acevedo 45 y.o. male is here for additional follow-up of Hodgkin Lymphoma.  Mr. Monday returns to the [place] alone today. In general, he says he's "doing okay."  He says he's been focused on himself and other than that has "no complaints."  Patient is unaccompanied. He states that he is feeling well and breathing well. His appetite is normal.   Patient  says he smokes off and on.   He has a rash on his left arm and right abdomen that he says he got after being out in the woods a week ago. He says it does not hurt, it was mildly itchy. It is drying up and has not spread.   Patient denies night sweats and abdominal pain.  He mentioned that he hit his head while working on a truck and has a scab. He notes this is getting better as well.    He states his vision is better. He is not sure when his next appointment with his retina specialist is.   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 He is back to exercising fairly regularly. Appetite is good.     MEDICAL HISTORY:  Past Medical History:  Diagnosis Date  . Bronchitis   . Hodgkin's lymphoma ([place])   . Lymphadenopathy, inguinal    left    SURGICAL HISTORY: Past Surgical History:  Procedure Laterality Date  . BONE MARROW ASPIRATION Left 01/20/15  . BONE MARROW BIOPSY Left 01/20/15  . INCISION AND DRAINAGE PERITONSILLAR ABSCESS    . KNEE SURGERY    . LYMPH NODE BIOPSY Left 01/13/2015   Procedure: LEFT INGUINAL LYMPH NODE BIOPSY;  Surgeon: Aviva Signs Md, MD;  Location: [facility];  Service: General;  Laterality: Left;  . PORTACATH PLACEMENT Left 01/31/2015   Procedure: INSERTION PORT-A-CATH;  Surgeon: Aviva Signs Md, MD;  Location: [facility];  Service: General;  Laterality: Left;  . WRIST SURGERY     right    SOCIAL HISTORY: Social History   Social History  . Marital status: Single    Spouse name: N/A  . Number of children: N/A  . Years of education: N/A   Occupational History  . Not on file.   Social History Main Topics  . Smoking status: Current Every Day Smoker    Packs/day: 0.50    Years: 2.00    Types: Cigarettes  . Smokeless tobacco: Never Used  . Alcohol use Yes     Comment: occasional  . Drug use:     Types: Marijuana     Comment: on occasion - last used 01/26/15  . Sexual activity: Yes    Birth control/ protection: None   Other Topics Concern  . Not on file   Social History Narrative  . No narrative on file  Pt is a smoker starting 2008, pack of cigarettes a week. Infrequent drinking alcohol, maybe one or two once a month. Denies drug use other than daily marijuana. In jail for 18 months.  Has 3 kids, 31 yo, 43 yo, and 2 yo. Has 1 grandchild. He has a car.   FAMILY HISTORY: Family History  Problem Relation Age of Onset  . Seizures Mother   . Diabetes Father   Father is 61 yo diabetic. Mother is 35 yo. Both still living. 1 brother, 4 sisters. One sister has breast  cancer, diagnosed 1996 in her 80s. Another sister has severe depression. He has been with his girlfriend 9-10 years. He lives with his girlfriend and his father. He hasn't told his father about his diagnosis yet. One sister lives in [place] and she is aware of his diagnosis. He told two of his childhood friends about his diagnosis.   ALLERGIES:  is allergic to clindamycin/lincomycin.  MEDICATIONS:  Current Outpatient Prescriptions  Medication Sig Dispense Refill  . amitriptyline (ELAVIL) 25 MG tablet Take one tablet at bedtime  for 5 days then increase to two tablets thereafter 60 tablet 3  . lidocaine-prilocaine (EMLA) cream Apply a quarter size amount to port site 1 hour prior to chemo. Do not rub in. Cover with plastic wrap. 30 g 3  . LORazepam (ATIVAN) 0.5 MG tablet Take 1 tablet (0.5 mg total) by mouth every 8 (eight) hours as needed for anxiety. 30 tablet 1  . citalopram (CELEXA) 20 MG tablet Take 1 tablet daily x 7 days. Then increase to 2 tablets daily. (Patient not taking: Reported on 04/29/2016) 49 tablet 0  . omeprazole (PRILOSEC) 40 MG capsule Take 1 capsule (40 mg total) by mouth daily. (Patient not taking: Reported on 02/05/2016) 30 capsule 3  . ondansetron (ZOFRAN) 8 MG tablet Take 1 tablet (8 mg total) by mouth every 8 (eight) hours as needed for nausea or vomiting. (Patient not taking: Reported on 09/02/2015) 60 tablet 1  . oxyCODONE-acetaminophen (PERCOCET/ROXICET) 5-325 MG tablet Take 1-2 tablets by mouth every 6 (six) hours as needed for severe pain. Take for Neulasta bone pain (Patient not taking: Reported on 02/05/2016) 45 tablet 0  . prochlorperazine (COMPAZINE) 10 MG tablet Take 1 tablet (10 mg total) by mouth every 6 (six) hours as needed for nausea or vomiting. (Patient not taking: Reported on 05/27/2015) 60 tablet 1   No current facility-administered medications for this visit.    Facility-Administered Medications Ordered in Other Visits  Medication Dose Route Frequency  Provider Last Rate Last Dose  . sodium chloride flush (NS) 0.9 % injection 10 mL  10 mL Intravenous PRN Patrici Ranks, MD   10 mL at 04/29/16 1230    Review of Systems  Constitutional: Negative for weight loss and diaphoresis. Negative for fever, chills and malaise/fatigue.  HENT: Negative for congestion, ear discharge, ear pain, hearing loss, nosebleeds, sore throat and tinnitus.   Eyes: Negative for blurred vision. Negative for double vision, photophobia, pain, discharge and redness.  Respiratory: Negative.  Negative for stridor.   Cardiovascular: Negative.   Gastrointestinal: Negative for heartburn, nausea, vomiting, diarrhea, blood in stool and melena. Genitourinary: Negative for dysuria, frequency, hematuria and flank pain.  Musculoskeletal: Negative.   Skin: Positive for rash  Noticed rash a week ago on left arm and right abdomen  Neurological: Negative for dizziness, tingling, tremors, sensory change, speech change, focal weakness, seizures, loss of consciousness and weakness.  Endo/Heme/Allergies: Negative.   Psychiatric/Behavioral:. Negative for depression, suicidal ideas, hallucinations and memory loss.  The patient is not nervous/anxious.    14 point ROS was done and is otherwise as detailed above or in HPI   PHYSICAL EXAMINATION: ECOG PERFORMANCE STATUS: 0 - Asymptomatic  Vitals:   04/29/16 1215  BP: (!) 111/59  Pulse: 78  Resp: 16  Temp: 98.5 F (36.9 C)   Filed Weights   04/29/16 1215  Weight: 179 lb (81.2 kg)   Physical Exam  Constitutional: He is oriented to person, place, and time and well-developed, well-nourished, and in no distress.  HENT:  Head: Normocephalic and atraumatic.  Nose: Nose normal.  Mouth/Throat: Oropharynx is clear and moist. No oropharyngeal exudate.  Eyes: Conjunctivae and EOM are normal. Pupils are equal, round, and reactive to light. Right eye exhibits no discharge. Left eye exhibits no discharge. No scleral icterus.  Neck: Normal  range of motion. Neck supple. No tracheal deviation present. No thyromegaly present.  Cardiovascular: Normal rate, regular rhythm and normal heart sounds.  Exam reveals no gallop and no friction rub. No murmur heard. Lymphatics: NO  palpable adenopathy in the neck, supraclavicular, axillary or inguinal regions. Pulmonary/Chest: Effort normal and breath sounds normal. He has no wheezes. He has no rales. Port a cath accessed L chest Abdominal: Soft. Bowel sounds are normal. He exhibits no distension and no mass. There is no tenderness. There is no rebound and no guarding.  Genitourinary:  deferred Musculoskeletal: Normal range of motion. He exhibits no edema.   Neurological: He is alert and oriented to person, place, and time. He has normal reflexes. No cranial nerve deficit. Gait normal. Coordination normal.  Skin: Rash noted. Skin is warm and dry.    Rash on his left arm and right abdomen, dry Psychiatric: Mood, memory, affect and judgment normal.  Nursing note and vitals reviewed.   LABORATORY DATA:  I have reviewed the data as listed. Lab Results  Component Value Date   WBC 4.6 04/29/2016   HGB 12.0 (L) 04/29/2016   HCT 37.3 (L) 04/29/2016   MCV 90.1 04/29/2016   PLT 187 04/29/2016   CMP     Component Value Date/Time   NA 141 04/29/2016 1234   K 3.3 (L) 04/29/2016 1234   CL 111 04/29/2016 1234   CO2 26 04/29/2016 1234   GLUCOSE 76 04/29/2016 1234   BUN 13 04/29/2016 1234   CREATININE 0.88 04/29/2016 1234   CALCIUM 7.6 (L) 04/29/2016 1234   PROT 7.1 04/29/2016 1234   ALBUMIN 3.0 (L) 04/29/2016 1234   AST 18 04/29/2016 1234   ALT 11 (L) 04/29/2016 1234   ALKPHOS 40 04/29/2016 1234   BILITOT 0.4 04/29/2016 1234   GFRNONAA >60 04/29/2016 1234   GFRAA >60 04/29/2016 1234   Results for JARREL, KNOKE (MRN [id number]) as of 02/09/2016 18:28  Ref. Range 02/05/2016 10:55  LDH Latest Ref Range: 98-192 U/L 172     PATHOLOGY:  al Information   RADIOGRAPHIC STUDIES: I  have personally reviewed the radiological images as listed and agreed with the findings in the report. CLINICAL DATA: Hodgkin lymphoma status post chemotherapy.  EXAM: CT NECK WITH CONTRAST  TECHNIQUE: Multidetector CT imaging of the neck was performed using the standard protocol following the bolus administration of intravenous contrast.  CONTRAST: 1108m ISOVUE-300 IOPAMIDOL (ISOVUE-300) INJECTION 61%  COMPARISON: PET-CT 07/29/2015  FINDINGS: Pharynx and larynx: Unremarkable without evidence of mass.  Salivary glands: Parotid and submandibular glands are unremarkable.  Thyroid: Unremarkable.  Lymph nodes: No enlarged lymph nodes identified in the neck.  Vascular: Major vascular structures in the neck are grossly patent.  Limited intracranial: Unremarkable.  Visualized orbits: Not imaged.  Mastoids and visualized paranasal sinuses: Minimal mucosal thickening/ small mucous retention cysts partially visualized in the right maxillary sinus. Visualized mastoid air cells clear.  Skeleton: No suspicious lytic or blastic osseous lesion identified. Moderate multilevel disc space narrowing and endplate osteophytosis in the lower cervical spine.  Upper chest: Evaluated on concurrent dedicated chest CT.  IMPRESSION: No evidence of recurrent lymphoma in the neck.   Electronically Signed  By: ALogan BoresM.D.  On: 01/05/2016 14:03  CLINICAL DATA: Follow-up Hodgkin's lymphoma  EXAM: CT CHEST, ABDOMEN, AND PELVIS WITH CONTRAST  TECHNIQUE: Multidetector CT imaging of the chest, abdomen and pelvis was performed following the standard protocol during bolus administration of intravenous contrast.  CONTRAST: 1058mISOVUE-300 IOPAMIDOL (ISOVUE-300) INJECTION 61%  COMPARISON: PET-CT dated 07/29/2015  FINDINGS: CT CHEST FINDINGS  Mediastinum/Nodes: Heart is normal in size. Trace pericardial effusion inferiorly, unchanged.  No evidence of  thoracic aortic aneurysm.  No suspicious mediastinal lymphadenopathy. 7 mm  short axis AP window node (series 5/ image 26), grossly unchanged, non FDG avid. Multiple bilateral axillary nodes measuring up to 6 mm short axis, grossly unchanged, non FDG avid.  Visualized thyroid is unremarkable.  Left chest port terminates in the mid SVC.  Lungs/Pleura: Lungs are essentially clear.  No suspicious pulmonary nodules. Two 4 mm probable triangular subpleural lymph nodes in the posterior right upper lobe (series 9/image 74), unchanged, likely benign.  Mild paraseptal emphysematous changes.  Mild subpleural reticulation/ fibrosis in the bilateral lower lobes, with superimposed dependent atelectasis.  No pleural effusion or pneumothorax.  Musculoskeletal: No focal osseous lesions. Mild degenerative changes of the lower thoracic spine.  CT ABDOMEN PELVIS FINDINGS  Hepatobiliary: Liver is within normal limits.  Gallbladder is underdistended but unremarkable. No intrahepatic or extrahepatic ductal dilatation.  Pancreas: Within normal limits.  Spleen: Normal in size.  Adrenals/Urinary Tract: Adrenal glands are within normal limits.  Kidneys are within normal limits. No hydronephrosis.  Bladder is mildly thick-walled although underdistended.  Stomach/Bowel: Stomach is within normal limits.  No evidence of bowel obstruction.  Normal appendix (series 5/ image 102).  Vascular/Lymphatic: No evidence of abdominal aortic aneurysm.  Retro aortic left renal vein.  No suspicious abdominopelvic lymphadenopathy.  Reproductive: Prostate is unremarkable.  Other: No abdominopelvic ascites.  Musculoskeletal: Visualized osseous structures are within normal limits.  IMPRESSION: Small bilateral axillary nodes and AP window mediastinal node, within normal limits and non FDG avid on prior PET.  No suspicious lymphadenopathy in the chest, abdomen, or pelvis  by CT size criteria.  Spleen is normal in size.   Electronically Signed  By: Julian Hy M.D.  On: 01/05/2016 15:51  ASSESSMENT & PLAN:  Hodgkin Lymphoma, Nodular sclerosing type, Stage III-2A (? involvement in lingula) Abnormal CT of the pelvis with lytic bone disease, not PET avid. Biopsy negative (polyclonal plasmacytosis) Polyclonal gammopathy Elevated ESR IPS Score: 5 year freedom from progression and OS of 2; 80% FFP and 91% OS Uveitis, followed in [place] PET/CT after 2 cycles Deauville 1 Tobacco Use Anemia Rash  He is overall doing well.  No evidence of recurrence. We reviewed ongoing surveillance of his Hodgkin lymphoma. I have recommended repeat imaging 6 months from his last studies therefore he will be due again in November. RTC 3 months with repeat physical exam and labs and to review scans.  I addressed the importance of smoking cessation with the patient in detail.  We discussed the health benefits of cessation.  We discussed the health detriments of ongoing tobacco use including but not limited to COPD, heart disease and malignancy. We reviewed the multiple options for cessation and I offered to refer him to smoking cessation classes here at [facility]. We discussed other alternatives to quit such as chantix, wellbutrin. We will continue to address this moving forward.  If his rash worsen he is to let me know, but it appears to be healing at this point.  Anemia will continue to be monitored.   NCCN guidelines for surveillance of Hodgkin's Lymphoma (Age >/= 22) (1.2017):  A. Complete remission should be documented including reversion of PET to "negative" within 3 months following completion of therapy.  B. It is recommended that the patient be provided with a treatment summary at the completion of his/her therapy, including details of radiation therapy, organs at risk, and cumulative anthracycline dosage given.  C. Follow-up with an oncologist is recommended,  especially during the first 5 years after treatment to detect recurrence, and then annually due to the  risk of late complications including second cancers and cardiovascular disease. Late relapse or transformation to large cell lymphoma may occur in [place].  D. Frequency and types of tests may vary depending on clinical circumstances: age and stage at diagnosis, social habits, treatment modality, etc. There are few data to support specific recommendations; these represent the range of practice at [facility].   1. Follow-up after completion of treatment up to 5 years:   A. Interim H and P: Every 3-6 months for 1-2 years, then every 6-12 months until year 3, then annually.   B. Annual influenza vaccine.   C. Laboratory studies:    1. CBC, platelets, ESR (if elevated at time of initial diagnosis), chemistry profile as clinically indicated.    2. Thyroid stimulating hormone (TSH) at least annually if radiation therapy to neck.   D. Acceptable to obtain a neck/chest/abdomen/pelvis CT scan with contrast, at 6, 12, and 24 month following completion of therapy, or as clinically indicated. PET/CT only if last PET was Deauville 4-5, to confirm complete response.   E. Counseling: Reproduction, health habits, psychosocial, cardiovascular, breast self-exam, skin cancer risk, end of treatment discussion.   F. Surveillance PET should not be done routinely due to risk of false positives. Management decision should not be based on PET scan alone; clinical or pathologic correlation is needed.   Orders Placed This Encounter  Procedures  . CT Abdomen Pelvis W Contrast    WILL COME 2 HRS EARLY TO DRINK CLEAR PREP    Standing Status:   Future    Standing Expiration Date:   04/29/2017    Order Specific Question:   If indicated for the ordered procedure, I authorize the administration of contrast media per Radiology protocol    Answer:   Yes    Order Specific Question:   Reason for Exam (SYMPTOM  OR  DIAGNOSIS REQUIRED)    Answer:   restaging hodgkin lymphoma, abnormal imaging pelvis    Order Specific Question:   Preferred imaging location?    Answer:   [facility]  . CT Chest W Contrast    WILL COME 2 HRS EARLY TO DRINK CLEAR PREP    Standing Status:   Future    Standing Expiration Date:   04/29/2017    Order Specific Question:   If indicated for the ordered procedure, I authorize the administration of contrast media per Radiology protocol    Answer:   Yes    Order Specific Question:   Reason for Exam (SYMPTOM  OR DIAGNOSIS REQUIRED)    Answer:   restaging hodgkin lymphoma, abnormal imaging pelvis    Order Specific Question:   Preferred imaging location?    Answer:   [facility]  . CBC with Differential    Standing Status:   Future    Standing Expiration Date:   04/29/2017  . Comprehensive metabolic panel    Standing Status:   Future    Standing Expiration Date:   04/29/2017  . Lactate dehydrogenase    Standing Status:   Future    Standing Expiration Date:   04/29/2017    All questions were answered. The patient knows to call the clinic with any problems, questions or concerns.   This document serves as a record of services personally performed by Ancil Linsey, MD. It was created on her behalf by Elmyra Ricks, a trained medical scribe. The creation of this record is based on the scribe's personal observations and the provider's statements to  them. This document has been checked and approved by the attending provider.  I have reviewed the above documentation for accuracy and completeness, and I agree with the above.  This note was electronically signed.  Kelby Fam. Whitney Muse, MD

## 2016-04-29 NOTE — Progress Notes (Signed)
Robert Acevedo presented for Portacath access and flush with lab draws drawn per orders Proper placement of portacath confirmed by CXR.  Portacath located leftchest wall accessed with  H 20 needle.  Good blood return present. Portacath flushed with 54ml NS and 500U/60ml Heparin and needle removed intact.  Procedure tolerated well and without incident. Pt discharged self ambulatory in satisfactory condition

## 2016-04-29 NOTE — Patient Instructions (Signed)
[place] at [facility] Discharge Instructions  RECOMMENDATIONS MADE BY THE CONSULTANT AND ANY TEST RESULTS WILL BE SENT TO YOUR REFERRING PHYSICIAN.  You saw Dr. Whitney Muse today. Follow up in 3 months with labs. CT scans prior to next appt. Continue port flushes  Thank you for choosing [place] at [facility] to provide your oncology and hematology care.  To afford each patient quality time with our provider, please arrive at least 15 minutes before your scheduled appointment time.   Beginning January 23rd 2017 lab work for the [facility] will be done in the  Main lab at [facility] on 1st floor. If you have a lab appointment with the [facility] please come in thru the  Main Entrance and check in at the main information desk  You need to re-schedule your appointment should you arrive 10 or more minutes late.  We strive to give you quality time with our providers, and arriving late affects you and other patients whose appointments are after yours.  Also, if you no show three or more times for appointments you may be dismissed from the clinic at the providers discretion.     Again, thank you for choosing [facility].  Our hope is that these requests will decrease the amount of time that you wait before being seen by our physicians.       _____________________________________________________________  Should you have questions after your visit to [facility], please contact our office at [phone] between the hours of 8:30 a.m. and 4:30 p.m.  Voicemails left after 4:30 p.m. will not be returned until the following business day.  For prescription refill requests, have your pharmacy contact our office.         Resources For Cancer Patients and their Caregivers ? American Cancer Society: Can assist with transportation, wigs, general needs, runs Look Good Feel Better.        367-143-3071 ? Cancer  Care: Provides financial assistance, online support groups, medication/co-pay assistance.  1-800-813-HOPE (902)806-0665) ? [place] Assists [place] cancer patients and their families through emotional , educational and financial support.  336-533-5122 ? [place] DSS Where to apply for food stamps, Medicaid and utility assistance. 843-342-7796 ? RCATS: Transportation to medical appointments. 3611269147 ? Social Security Administration: May apply for disability if have a Stage IV cancer. (248)151-8930 [phone]) [phone] ? [employer], Disability and Transit Services: Assists with nutrition, care and transit needs. [place] Support Programs: @10RELATIVEDAYS @ > Cancer Support Group  2nd Tuesday of the month 1pm-2pm, Journey Room  > Creative Journey  3rd Tuesday of the month 1130am-1pm, Journey Room  > Look Good Feel Better  1st Wednesday of the month 10am-12 noon, Journey Room (Call [place] to register [phone])

## 2016-04-29 NOTE — Patient Instructions (Signed)
[place] at [facility] Discharge Instructions  RECOMMENDATIONS MADE BY THE CONSULTANT AND ANY TEST RESULTS WILL BE SENT TO YOUR REFERRING PHYSICIAN.  Port lab draw with flush today. Follow-up as scheduled. Call clinic for any questions or concerns  Thank you for choosing [place] at [facility] to provide your oncology and hematology care.  To afford each patient quality time with our provider, please arrive at least 15 minutes before your scheduled appointment time.   Beginning January 23rd 2017 lab work for the [facility] will be done in the  Main lab at [facility] on 1st floor. If you have a lab appointment with the [facility] please come in thru the  Main Entrance and check in at the main information desk  You need to re-schedule your appointment should you arrive 10 or more minutes late.  We strive to give you quality time with our providers, and arriving late affects you and other patients whose appointments are after yours.  Also, if you no show three or more times for appointments you may be dismissed from the clinic at the providers discretion.     Again, thank you for choosing [facility].  Our hope is that these requests will decrease the amount of time that you wait before being seen by our physicians.       _____________________________________________________________  Should you have questions after your visit to [facility], please contact our office at [phone] between the hours of 8:30 a.m. and 4:30 p.m.  Voicemails left after 4:30 p.m. will not be returned until the following business day.  For prescription refill requests, have your pharmacy contact our office.         Resources For Cancer Patients and their Caregivers ? American Cancer Society: Can assist with transportation, wigs, general needs, runs Look Good Feel Better.        [phone] ? Cancer Care: Provides financial  assistance, online support groups, medication/co-pay assistance.  1-800-813-HOPE (916)075-3753) ? [place] Assists [place] cancer patients and their families through emotional , educational and financial support.  (530)778-5510 ? [place] DSS Where to apply for food stamps, Medicaid and utility assistance. 430 126 9425 ? RCATS: Transportation to medical appointments. (585)381-9676 ? Social Security Administration: May apply for disability if have a Stage IV cancer. [phone] [phone])011-9809 ? [employer], Disability and Transit Services: Assists with nutrition, care and transit needs. [place] Support Programs: @10RELATIVEDAYS @ > Cancer Support Group  2nd Tuesday of the month 1pm-2pm, Journey Room  > Creative Journey  3rd Tuesday of the month 1130am-1pm, Journey Room  > Look Good Feel Better  1st Wednesday of the month 10am-12 noon, Journey Room (Call [place] to register [phone])

## 2016-05-03 ENCOUNTER — Encounter (HOSPITAL_COMMUNITY): Payer: Self-pay | Admitting: Hematology & Oncology

## 2016-06-24 ENCOUNTER — Encounter (HOSPITAL_COMMUNITY): Payer: Self-pay

## 2016-06-24 ENCOUNTER — Encounter (HOSPITAL_COMMUNITY): Payer: Medicaid Other | Attending: Hematology & Oncology

## 2016-06-24 DIAGNOSIS — E8809 Other disorders of plasma-protein metabolism, not elsewhere classified: Secondary | ICD-10-CM | POA: Insufficient documentation

## 2016-06-24 DIAGNOSIS — Z452 Encounter for adjustment and management of vascular access device: Secondary | ICD-10-CM | POA: Diagnosis not present

## 2016-06-24 DIAGNOSIS — Z8579 Personal history of other malignant neoplasms of lymphoid, hematopoietic and related tissues: Secondary | ICD-10-CM | POA: Diagnosis not present

## 2016-06-24 MED ORDER — SODIUM CHLORIDE 0.9% FLUSH
10.0000 mL | INTRAVENOUS | Status: DC | PRN
  Administered 2016-06-24: 10 mL via INTRAVENOUS
  Filled 2016-06-24: qty 10

## 2016-06-24 MED ORDER — HEPARIN SOD (PORK) LOCK FLUSH 100 UNIT/ML IV SOLN
500.0000 [IU] | Freq: Once | INTRAVENOUS | Status: AC
  Administered 2016-06-24: 500 [IU] via INTRAVENOUS

## 2016-06-24 NOTE — Progress Notes (Signed)
Robert Acevedo presented for Portacath access and flush.  Portacath located left chest wall accessed with  H 20 needle.  Good blood return present. Portacath flushed with 69ml NS and 500U/46ml Heparin and needle removed intact.  Procedure tolerated well and without incident.

## 2016-06-24 NOTE — Patient Instructions (Signed)
[facility] at [facility] Discharge Instructions  RECOMMENDATIONS MADE BY THE CONSULTANT AND ANY TEST RESULTS WILL BE SENT TO YOUR REFERRING PHYSICIAN.  Port flush today. Return as scheduled for port flushes. Return as scheduled for office visit.   Thank you for choosing [facility] at [facility] to provide your oncology and hematology care.  To afford each patient quality time with our provider, please arrive at least 15 minutes before your scheduled appointment time.   Beginning January 23rd 2017 lab work for the Cancer Center will be done in the  Main lab at [place] on 1st floor. If you have a lab appointment with the Cancer Center please come in thru the  Main Entrance and check in at the main information desk  You need to re-schedule your appointment should you arrive 10 or more minutes late.  We strive to give you quality time with our providers, and arriving late affects you and other patients whose appointments are after yours.  Also, if you no show three or more times for appointments you may be dismissed from the clinic at the providers discretion.     Again, thank you for choosing [facility].  Our hope is that these requests will decrease the amount of time that you wait before being seen by our physicians.       _____________________________________________________________  Should you have questions after your visit to [facility], please contact our office at [phone] between the hours of 8:30 a.m. and 4:30 p.m.  Voicemails left after 4:30 p.m. will not be returned until the following business day.  For prescription refill requests, have your pharmacy contact our office.         Resources For Cancer Patients and their Caregivers ? American Cancer Society: Can assist with transportation, wigs, general needs, runs Look Good Feel Better.        1-888-227-6333 ? Cancer Care: Provides financial  assistance, online support groups, medication/co-pay assistance.  1-800-813-HOPE (4673) ? [facility] Assists [place] cancer patients and their families through emotional , educational and financial support.  336-427-4357 ? [place] DSS Where to apply for food stamps, Medicaid and utility assistance. 336-342-1394 ? RCATS: Transportation to medical appointments. 336-347-2287 ? Social Security Administration: May apply for disability if have a Stage IV cancer. 336-342-7796 1-800-772-1213 ? [place] Aging, Disability and Transit Services: Assists with nutrition, care and transit needs. 336-349-2343  Cancer Center Support Programs: @10RELATIVEDAYS@ > Cancer Support Group  2nd Tuesday of the month 1pm-2pm, Journey Room  > Creative Journey  3rd Tuesday of the month 1130am-1pm, Journey Room  > Look Good Feel Better  1st Wednesday of the month 10am-12 noon, Journey Room (Call [employer] to register [phone])   

## 2016-08-03 ENCOUNTER — Encounter (HOSPITAL_COMMUNITY): Payer: Self-pay

## 2016-08-03 ENCOUNTER — Encounter (HOSPITAL_COMMUNITY): Payer: Medicaid Other | Attending: Hematology & Oncology

## 2016-08-03 DIAGNOSIS — Z8579 Personal history of other malignant neoplasms of lymphoid, hematopoietic and related tissues: Secondary | ICD-10-CM

## 2016-08-03 DIAGNOSIS — E8809 Other disorders of plasma-protein metabolism, not elsewhere classified: Secondary | ICD-10-CM | POA: Insufficient documentation

## 2016-08-03 DIAGNOSIS — C8100 Nodular lymphocyte predominant Hodgkin lymphoma, unspecified site: Secondary | ICD-10-CM

## 2016-08-03 LAB — CBC WITH DIFFERENTIAL/PLATELET
Basophils Absolute: 0 10*3/uL (ref 0.0–0.1)
Basophils Relative: 0 %
EOS PCT: 2 %
Eosinophils Absolute: 0.1 10*3/uL (ref 0.0–0.7)
HCT: 41.4 % (ref 39.0–52.0)
HEMOGLOBIN: 13.6 g/dL (ref 13.0–17.0)
LYMPHS ABS: 2.3 10*3/uL (ref 0.7–4.0)
LYMPHS PCT: 46 %
MCH: 29.8 pg (ref 26.0–34.0)
MCHC: 32.9 g/dL (ref 30.0–36.0)
MCV: 90.8 fL (ref 78.0–100.0)
MONOS PCT: 10 %
Monocytes Absolute: 0.5 10*3/uL (ref 0.1–1.0)
NEUTROS PCT: 42 %
Neutro Abs: 2.1 10*3/uL (ref 1.7–7.7)
Platelets: 222 10*3/uL (ref 150–400)
RBC: 4.56 MIL/uL (ref 4.22–5.81)
RDW: 15.2 % (ref 11.5–15.5)
WBC: 5 10*3/uL (ref 4.0–10.5)

## 2016-08-03 LAB — COMPREHENSIVE METABOLIC PANEL
ALK PHOS: 53 U/L (ref 38–126)
ALT: 15 U/L — AB (ref 17–63)
ANION GAP: 6 (ref 5–15)
AST: 22 U/L (ref 15–41)
Albumin: 3.6 g/dL (ref 3.5–5.0)
BUN: 13 mg/dL (ref 6–20)
CALCIUM: 8.7 mg/dL — AB (ref 8.9–10.3)
CO2: 28 mmol/L (ref 22–32)
CREATININE: 1.05 mg/dL (ref 0.61–1.24)
Chloride: 101 mmol/L (ref 101–111)
Glucose, Bld: 116 mg/dL — ABNORMAL HIGH (ref 65–99)
Potassium: 3.6 mmol/L (ref 3.5–5.1)
Sodium: 135 mmol/L (ref 135–145)
TOTAL PROTEIN: 8.5 g/dL — AB (ref 6.5–8.1)
Total Bilirubin: 0.5 mg/dL (ref 0.3–1.2)

## 2016-08-03 LAB — LACTATE DEHYDROGENASE: LDH: 167 U/L (ref 98–192)

## 2016-08-03 MED ORDER — SODIUM CHLORIDE 0.9% FLUSH
10.0000 mL | INTRAVENOUS | Status: DC | PRN
  Administered 2016-08-03: 10 mL via INTRAVENOUS
  Filled 2016-08-03: qty 10

## 2016-08-03 MED ORDER — HEPARIN SOD (PORK) LOCK FLUSH 100 UNIT/ML IV SOLN
500.0000 [IU] | Freq: Once | INTRAVENOUS | Status: AC
  Administered 2016-08-03: 500 [IU] via INTRAVENOUS
  Filled 2016-08-03: qty 5

## 2016-08-03 NOTE — Progress Notes (Signed)
Robert Acevedo presented for Portacath access and flush. Portacath located left chest wall accessed with  H 20 needle. Good blood return present. Portacath flushed with 76ml NS and 500U/16ml Heparin and needle removed intact. Procedure without incident. Patient tolerated procedure well. Labs drawn per orders. Vitals stable, follow up as scheduled.

## 2016-08-03 NOTE — Patient Instructions (Signed)
[place] at [facility] Discharge Instructions  RECOMMENDATIONS MADE BY THE CONSULTANT AND ANY TEST RESULTS WILL BE SENT TO YOUR REFERRING PHYSICIAN.  Port a cath flush today with labs Follow up as scheduled.  Thank you for choosing [place] at [facility] to provide your oncology and hematology care.  To afford each patient quality time with our provider, please arrive at least 15 minutes before your scheduled appointment time.   Beginning January 23rd 2017 lab work for the [facility] will be done in the  Main lab at [facility] on 1st floor. If you have a lab appointment with the [facility] please come in thru the  Main Entrance and check in at the main information desk  You need to re-schedule your appointment should you arrive 10 or more minutes late.  We strive to give you quality time with our providers, and arriving late affects you and other patients whose appointments are after yours.  Also, if you no show three or more times for appointments you may be dismissed from the clinic at the providers discretion.     Again, thank you for choosing [facility].  Our hope is that these requests will decrease the amount of time that you wait before being seen by our physicians.       _____________________________________________________________  Should you have questions after your visit to [facility], please contact our office at [phone] between the hours of 8:30 a.m. and 4:30 p.m.  Voicemails left after 4:30 p.m. will not be returned until the following business day.  For prescription refill requests, have your pharmacy contact our office.         Resources For Cancer Patients and their Caregivers ? American Cancer Society: Can assist with transportation, wigs, general needs, runs Look Good Feel Better.        [phone] ? Cancer Care: Provides financial assistance, online support groups,  medication/co-pay assistance.  1-800-813-HOPE 614 228 8116) ? [place] Assists [place] cancer patients and their families through emotional , educational and financial support.  5030570568 ? [place] DSS Where to apply for food stamps, Medicaid and utility assistance. 317-674-5053 ? RCATS: Transportation to medical appointments. (515)718-8789 ? Social Security Administration: May apply for disability if have a Stage IV cancer. ([phone] 337-549-7696 ? [employer], Disability and Transit Services: Assists with nutrition, care and transit needs. [place] Support Programs: @10RELATIVEDAYS @ > Cancer Support Group  2nd Tuesday of the month 1pm-2pm, Journey Room  > Creative Journey  3rd Tuesday of the month 1130am-1pm, Journey Room  > Look Good Feel Better  1st Wednesday of the month 10am-12 noon, Journey Room (Call [place] to register [phone])

## 2016-08-05 ENCOUNTER — Ambulatory Visit (HOSPITAL_COMMUNITY): Payer: Medicaid Other

## 2016-08-05 ENCOUNTER — Ambulatory Visit (HOSPITAL_COMMUNITY)
Admission: RE | Admit: 2016-08-05 | Discharge: 2016-08-05 | Disposition: A | Discharge status: Home / Self Care | Payer: Medicaid Other | Source: Ambulatory Visit | Attending: Hematology & Oncology | Admitting: Hematology & Oncology

## 2016-08-05 DIAGNOSIS — M898X5 Other specified disorders of bone, thigh: Secondary | ICD-10-CM | POA: Insufficient documentation

## 2016-08-05 DIAGNOSIS — C8100 Nodular lymphocyte predominant Hodgkin lymphoma, unspecified site: Secondary | ICD-10-CM | POA: Diagnosis present

## 2016-08-05 MED ORDER — IOPAMIDOL (ISOVUE-300) INJECTION 61%
100.0000 mL | Freq: Once | INTRAVENOUS | Status: AC | PRN
  Administered 2016-08-05: 100 mL via INTRAVENOUS

## 2016-08-05 MED ORDER — IOPAMIDOL (ISOVUE-300) INJECTION 61%
INTRAVENOUS | Status: AC
  Filled 2016-08-05: qty 30

## 2016-08-06 ENCOUNTER — Encounter (HOSPITAL_COMMUNITY): Payer: Self-pay | Admitting: Hematology & Oncology

## 2016-08-06 ENCOUNTER — Encounter (HOSPITAL_BASED_OUTPATIENT_CLINIC_OR_DEPARTMENT_OTHER): Payer: Medicaid Other | Admitting: Hematology & Oncology

## 2016-08-06 VITALS — BP 109/63 | HR 73 | Temp 99.2°F | Resp 16 | Wt 175.5 lb

## 2016-08-06 DIAGNOSIS — R21 Rash and other nonspecific skin eruption: Secondary | ICD-10-CM | POA: Diagnosis not present

## 2016-08-06 DIAGNOSIS — D649 Anemia, unspecified: Secondary | ICD-10-CM

## 2016-08-06 DIAGNOSIS — D890 Polyclonal hypergammaglobulinemia: Secondary | ICD-10-CM | POA: Diagnosis not present

## 2016-08-06 DIAGNOSIS — C8100 Nodular lymphocyte predominant Hodgkin lymphoma, unspecified site: Secondary | ICD-10-CM

## 2016-08-06 DIAGNOSIS — Z8579 Personal history of other malignant neoplasms of lymphoid, hematopoietic and related tissues: Secondary | ICD-10-CM | POA: Diagnosis present

## 2016-08-06 DIAGNOSIS — H209 Unspecified iridocyclitis: Secondary | ICD-10-CM

## 2016-08-06 DIAGNOSIS — Z95828 Presence of other vascular implants and grafts: Secondary | ICD-10-CM

## 2016-08-06 DIAGNOSIS — Z720 Tobacco use: Secondary | ICD-10-CM

## 2016-08-06 DIAGNOSIS — M898X5 Other specified disorders of bone, thigh: Secondary | ICD-10-CM

## 2016-08-06 NOTE — Progress Notes (Signed)
[name] NOTE  Patient Care Team: No Pcp Per Patient as PCP - General (General Practice)  CHIEF COMPLAINTS/PURPOSE OF CONSULTATION:  Hodgkin lymphoma Staging form: Lymphoid Neoplasms, AJCC 6th Edition Clinical stage from 03/04/2015: Stage III - Unsigned Staging comments:  Questionable involvement of lingula on PET imaging. BMBX negative. PET with mild hyper metabolism with borderline enlarged bilateral axillar and mediastinal nodes. Bilateral inguinal nodes.   NOTE: patient has lytic lesion in the pelvis that is NOT PET avid.   L inquinal LN biopsy on 01/13/2015 with final pathology showing Classical Hodgkin Lymphoma, nodular sclerosing type Bone marrow biopsy 01/20/2015 without evidence of malignancy    Hodgkin lymphoma ([place])   01/13/2015 Initial Biopsy    L inguinal LN biopsy on 01/13/2015 with Classical Hodgkin Lymphoma, nodular sclerosing type      01/20/2015 Bone Marrow Biopsy    normal BMBX, mild polyclonal plasmacytosis      01/28/2015 Imaging    PET/CT Mild hyper metabolism is associated with the borderline enlarged bilateral axillary and mediastinal lymph nodes. There is more intense FDG uptake associated with bilateral inguinal lymph nodes. focal area of peripheral consolidation in the lingula.      01/29/2015 Imaging    MUGA with EF 69%      01/29/2015 Imaging    1.spirometry is normal .no change with bronchodilator. lung volumes are normal. airway resistance somewhat high. DLCO normal decreased MIP/MEP may indicate weakness of the bellows function of the lung.       02/04/2015 - 07/08/2015 Chemotherapy    ABVD x 6 cycles      03/28/2015 PET scan    No adenopathy or abnormal hypermetabolic activity along the previous axillary, mediastinal, and inguinal lymph node chains. Deauville 1.      03/31/2015 Imaging    MUGA- Normal LEFT ventricular ejection fraction of 70%, not significantly changed from the 69% calculated on the previous  study of 01/29/2015.      05/30/2015 Imaging    MUGA- No significant change in ejection fraction equal 73%      07/29/2015 Imaging    PET/CT no specific evidence of recurrent metabolically active Hodgkin's      07/29/2015 Remission         09/10/2015 Imaging    MRI pelvis- Multiple stable lytic slightly enhancing lesions in the iliac bones and sacrum, unchanged since 12/20/2014. Even though biopsy was negative for myeloma in May 2016, I think the patient remains at increased risk for development of myeloma.      11/26/2015 Pathology Results    BMBX negative, polyclonal plasmacytosis noted      01/05/2016 Imaging    CT neck- No evidence of recurrent lymphoma in the neck.      01/05/2016 Imaging    CT CAP- Small bilateral axillary nodes and AP window mediastinal node, within normal limits and non FDG avid on prior PET. No suspicious lymphadenopathy in the chest, abdomen, or pelvis by CT size criteria. Spleen is normal in size.       08/05/2016 Imaging    Stable exam. No evidence of recurrent lymphoma or other acute findings.       HISTORY OF PRESENTING ILLNESS:  Robert Acevedo 45 y.o. male is here for additional follow-up of Hodgkin Lymphoma. Last visit was at the end of August.   Robert Acevedo returns to the [place] alone today. I have reviewed the labs with the patient. CT imaging was reviewed.   He  would like to know if he can still have children.   His eyes are overall better. He says that he occasionally has some redness around his left eye. He is not sure when he has follow-up with his retina specialist.   His sister is brain dead from someone giving her CPR after choking on a peppermint, without clearing her airway first. He is having difficulty with this because he knows she won't get better. He is tearful. He notes that his family is waiting for "everyone" to arrive before removing life support. Other than this, he has been doing well and is feeling great.    His energy is almost back to normal. His appetite is good.   Denies abdominal pain.   He would like his port removed. Dr. Arnoldo Acevedo placed the port at time of diagnosis.  He is getting a dental exam next week.  No B symptoms.   MEDICAL HISTORY:  Past Medical History:  Diagnosis Date  . Bronchitis   . Hodgkin's lymphoma ([place])   . Lymphadenopathy, inguinal    left    SURGICAL HISTORY: Past Surgical History:  Procedure Laterality Date  . BONE MARROW ASPIRATION Left 01/20/15  . BONE MARROW BIOPSY Left 01/20/15  . INCISION AND DRAINAGE PERITONSILLAR ABSCESS    . KNEE SURGERY    . LYMPH NODE BIOPSY Left 01/13/2015   Procedure: LEFT INGUINAL LYMPH NODE BIOPSY;  Surgeon: Robert Signs Md, Acevedo;  Location: [facility];  Service: General;  Laterality: Left;  . PORTACATH PLACEMENT Left 01/31/2015   Procedure: INSERTION PORT-A-CATH;  Surgeon: Robert Signs Md, Acevedo;  Location: [facility];  Service: General;  Laterality: Left;  . WRIST SURGERY     right    SOCIAL HISTORY: Social History   Social History  . Marital status: Single    Spouse name: N/A  . Number of children: N/A  . Years of education: N/A   Occupational History  . Not on file.   Social History Main Topics  . Smoking status: Current Every Day Smoker    Packs/day: 0.50    Years: 2.00    Types: Cigarettes  . Smokeless tobacco: Never Used  . Alcohol use Yes     Comment: occasional  . Drug use:     Types: Marijuana     Comment: on occasion - last used 01/26/15  . Sexual activity: Yes    Birth control/ protection: None   Other Topics Concern  . Not on file   Social History Narrative  . No narrative on file  Pt is a smoker starting 2008, pack of cigarettes a week. Infrequent drinking alcohol, maybe one or two once a month. Denies drug use other than daily marijuana. In jail for 18 months.  Has 3 kids, 25 yo, 53 yo, and 26 yo. Has 1 grandchild. He has a car.   FAMILY HISTORY: Family History  Problem Relation Age of Onset  .  Seizures Mother   . Diabetes Father   Father is 64 yo diabetic. Mother is 38 yo. Both still living. 1 brother, 4 sisters. One sister has breast cancer, diagnosed 1996 in her 42s. Another sister has severe depression. He has been with his girlfriend 9-10 years. He lives with his girlfriend and his father. He hasn't told his father about his diagnosis yet. One sister lives in [place] and she is aware of his diagnosis. He told two of his childhood friends about his diagnosis.   ALLERGIES:  is allergic to clindamycin/lincomycin.  MEDICATIONS:  Current Outpatient Prescriptions  Medication Sig Dispense Refill  . amitriptyline (ELAVIL) 25 MG tablet Take one tablet at bedtime for 5 days then increase to two tablets thereafter (Patient taking differently: Take 25 mg by mouth at bedtime as needed (anxiety). ) 60 tablet 3  . citalopram (CELEXA) 20 MG tablet Take 1 tablet daily x 7 days. Then increase to 2 tablets daily. (Patient taking differently: Take 20 mg by mouth 2 (two) times daily. ) 49 tablet 0  . lidocaine-prilocaine (EMLA) cream Apply a quarter size amount to port site 1 hour prior to chemo. Do not rub in. Cover with plastic wrap. 30 g 3  . LORazepam (ATIVAN) 0.5 MG tablet Take 1 tablet (0.5 mg total) by mouth every 8 (eight) hours as needed for anxiety. 30 tablet 1  . omeprazole (PRILOSEC) 40 MG capsule Take 1 capsule (40 mg total) by mouth daily. (Patient taking differently: Take 40 mg by mouth daily as needed (indigestion). ) 30 capsule 3  . ondansetron (ZOFRAN) 8 MG tablet Take 1 tablet (8 mg total) by mouth every 8 (eight) hours as needed for nausea or vomiting. (Patient not taking: Reported on 09/02/2016) 60 tablet 1  . oxyCODONE-acetaminophen (PERCOCET/ROXICET) 5-325 MG tablet Take 1-2 tablets by mouth every 6 (six) hours as needed for severe pain. Take for Neulasta bone pain (Patient not taking: Reported on 09/02/2016) 45 tablet 0  . prochlorperazine (COMPAZINE) 10 MG tablet Take 1 tablet  (10 mg total) by mouth every 6 (six) hours as needed for nausea or vomiting. (Patient not taking: Reported on 09/02/2016) 60 tablet 1  . ibuprofen (ADVIL,MOTRIN) 200 MG tablet Take 400 mg by mouth every 6 (six) hours as needed for moderate pain.     No current facility-administered medications for this visit.     Review of Systems  Constitutional: Negative.   HENT: Negative.   Eyes: Negative.   Respiratory: Negative.   Cardiovascular: Negative.   Gastrointestinal: Negative.  Negative for abdominal pain.  Genitourinary: Negative.   Musculoskeletal: Negative.   Skin: Negative.        Occasional redness around left eye.   Neurological: Negative.   Endo/Heme/Allergies: Negative.   Psychiatric/Behavioral: The patient is nervous/anxious (about condition of his sister).   All other systems reviewed and are negative. 14 point ROS was done and is otherwise as detailed above or in HPI   PHYSICAL EXAMINATION: ECOG PERFORMANCE STATUS: 0 - Asymptomatic  Vitals:   08/06/16 1121  BP: 109/63  Pulse: 73  Resp: 16  Temp: 99.2 F (37.3 C)   Filed Weights   08/06/16 1121  Weight: 175 lb 8 oz (79.6 kg)     Physical Exam  Constitutional: He is oriented to person, place, and time and well-developed, well-nourished, and in no distress.  Pt was able to get on exam table without assistance.   HENT:  Head: Normocephalic and atraumatic.  Mouth/Throat: Oropharynx is clear and moist.  Eyes: Conjunctivae and EOM are normal. Pupils are equal, round, and reactive to light. No scleral icterus.  Neck: Normal range of motion. Neck supple.  Cardiovascular: Normal rate, regular rhythm and normal heart sounds.   Pulmonary/Chest: Effort normal and breath sounds normal.  Abdominal: Soft. Bowel sounds are normal. He exhibits no distension and no mass. There is no tenderness. There is no rebound and no guarding.  Musculoskeletal: Normal range of motion.  Lymphadenopathy:    He has no cervical adenopathy.     He has no axillary adenopathy.  Neurological: He  is alert and oriented to person, place, and time. No cranial nerve deficit. Gait normal.  Skin: Skin is warm and dry.  Psychiatric: Mood, memory, affect and judgment normal.  Nursing note and vitals reviewed.  LABORATORY DATA:  I have reviewed the data as listed. Lab Results  Component Value Date   WBC 5.0 08/03/2016   HGB 13.6 08/03/2016   HCT 41.4 08/03/2016   MCV 90.8 08/03/2016   PLT 222 08/03/2016   CMP     Component Value Date/Time   NA 135 08/03/2016 1451   K 3.6 08/03/2016 1451   CL 101 08/03/2016 1451   CO2 28 08/03/2016 1451   GLUCOSE 116 (Robert Acevedo) 08/03/2016 1451   BUN 13 08/03/2016 1451   CREATININE 1.05 08/03/2016 1451   CALCIUM 8.7 (L) 08/03/2016 1451   PROT 8.5 (Robert Acevedo) 08/03/2016 1451   ALBUMIN 3.6 08/03/2016 1451   AST 22 08/03/2016 1451   ALT 15 (L) 08/03/2016 1451   ALKPHOS 53 08/03/2016 1451   BILITOT 0.5 08/03/2016 1451   GFRNONAA >60 08/03/2016 1451   GFRAA >60 08/03/2016 1451    PATHOLOGY:  al Information   RADIOGRAPHIC STUDIES: I have personally reviewed the radiological images as listed and agreed with the findings in the report. Study Result   CLINICAL DATA:  Followup Hodgkin's lymphoma. Status post chemotherapy and radiation therapy. Restaging.  EXAM: CT CHEST, ABDOMEN, AND PELVIS WITH CONTRAST  TECHNIQUE: Multidetector CT imaging of the chest, abdomen and pelvis was performed following the standard protocol during bolus administration of intravenous contrast.  CONTRAST:  176m ISOVUE-300 IOPAMIDOL (ISOVUE-300) INJECTION 61%  COMPARISON:  01/05/2016  FINDINGS: CT CHEST FINDINGS  Cardiovascular: No acute findings. Left-sided Port-A-Cath remains in appropriate position.  Mediastinum/Lymph Nodes: No masses or pathologically enlarged lymph nodes identified.  Lungs/Pleura: No pulmonary consolidation or mass identified. No effusion present. Mild paraseptal emphysema. Mild  bilateral dependent atelectasis.  Musculoskeletal:  No suspicious bone lesions identified.  CT ABDOMEN AND PELVIS FINDINGS  Hepatobiliary: No masses identified. Mild hepatic steatosis again noted with focal fatty sparing in segment 4B of the left lobe adjacent to the porta hepatis. Gallbladder is unremarkable.  Pancreas:  No mass or inflammatory changes.  Spleen:  Within normal limits in size and appearance.  Adrenals/Urinary tract:  No masses or hydronephrosis.  Stomach/Bowel: No evidence of obstruction, inflammatory process, or abnormal fluid collections.  Vascular/Lymphatic: No pathologically enlarged lymph nodes identified. No abdominal aortic aneurysm.  Reproductive:  No mass or other significant abnormality identified.  Other:  None.  Musculoskeletal: Several small lucent bone lesions involving both iliac bones in the pelvis show no significant change. These were non-FDG avid on previous PET. No new or progressive bone lesions identified.  IMPRESSION: Stable exam. No evidence of recurrent lymphoma or other acute findings.   Electronically Signed   By: JEarle GellM.D.   On: 08/05/2016 14:24   ASSESSMENT & PLAN:  Hodgkin Lymphoma, Nodular sclerosing type, Stage III-2A (? involvement in lingula) Abnormal CT of the pelvis with lytic bone disease, not PET avid. Biopsy negative (polyclonal plasmacytosis) Polyclonal gammopathy Elevated ESR IPS Score: 5 year freedom from progression and OS of 2; 80% FFP and 91% OS Uve[place]ville 1 Tobacco Use Anemia Rash  He is overall doing well.  No evidence of recurrence. We reviewed ongoing surveillance of his Hodgkin lymphoma per NCCN guidelines.  I discussed with him that he should still be able to have children. If he wants to be  sure that he is still fertile, I have recommended a semen analysis. I have given him fertility information regarding patients having  received similar/same treatment.  WE discussed the following from Up to Date:  ABVD regimen (doxorubicin, bleomycin, vinblastine, and dacarbazine) is significantly less gonadotoxic [54-57]. In one study, as an example, azoospermia and oligospermia developed in 43 and 20 percent of men treated with ABVD, respectively, and all recovered to normal values   I addressed the importance of smoking cessation with the patient in detail.  We discussed the health benefits of cessation.  We discussed the health detriments of ongoing tobacco use including but not limited to COPD, heart disease and malignancy. We reviewed the multiple options for cessation and I offered to refer him to smoking cessation classes here at [facility]. We discussed other alternatives to quit such as chantix, wellbutrin. We will continue to address this moving forward.  I will refer him to Dr. Arnoldo Acevedo to to get his port removed.   He will return for a follow up in 4 months.   NCCN guidelines for surveillance of Hodgkin's Lymphoma (Age >/= 57) (1.2017):  A. Complete remission should be documented including reversion of PET to "negative" within 3 months following completion of therapy.  B. It is recommended that the patient be provided with a treatment summary at the completion of his/her therapy, including details of radiation therapy, organs at risk, and cumulative anthracycline dosage given.  C. Follow-up with an oncologist is recommended, especially during the first 5 years after treatment to detect recurrence, and then annually due to the risk of late complications including second cancers and cardiovascular disease. Late relapse or transformation to large cell lymphoma may occur in [place].  D. Frequency and types of tests may vary depending on clinical circumstances: age and stage at diagnosis, social habits, treatment modality, etc. There are few data to support specific recommendations; these represent the range of practice at [facility].   1. Follow-up after completion of treatment up to 5 years:   A. Interim Robert Acevedo and P: Every 3-6 months for 1-2 years, then every 6-12 months until year 3, then annually.   B. Annual influenza vaccine.   C. Laboratory studies:    1. CBC, platelets, ESR (if elevated at time of initial diagnosis), chemistry profile as clinically indicated.    2. Thyroid stimulating hormone (TSH) at least annually if radiation therapy to neck.   D. Acceptable to obtain a neck/chest/abdomen/pelvis CT scan with contrast, at 6, 12, and 24 month following completion of therapy, or as clinically indicated. PET/CT only if last PET was Deauville 4-5, to confirm complete response.   E. Counseling: Reproduction, health habits, psychosocial, cardiovascular, breast self-exam, skin cancer risk, end of treatment discussion.   F. Surveillance PET should not be done routinely due to risk of false positives. Management decision should not be based on PET scan alone; clinical or pathologic correlation is needed.   Orders Placed This Encounter  Procedures  . CBC with Differential    Standing Status:   Future    Standing Expiration Date:   08/06/2017  . Comprehensive metabolic panel    Standing Status:   Future    Standing Expiration Date:   08/06/2017  . Lactate dehydrogenase    Standing Status:   Future    Standing Expiration Date:   08/06/2017    All questions were answered. The patient knows to call the clinic with any problems, questions or concerns.   This document  serves as a record of services personally performed by Ancil Linsey, Acevedo. It was created on her behalf by Martinique Casey, a trained medical scribe. The creation of this record is based on the scribe's personal observations and the provider's statements to them. This document has been checked and approved by the attending provider.  I have reviewed the above documentation for accuracy and completeness, and I agree with the above.  This note was  electronically signed.  Kelby Fam. Whitney Muse, Acevedo

## 2016-08-06 NOTE — Patient Instructions (Addendum)
[place] at [facility] Discharge Instructions  RECOMMENDATIONS MADE BY THE CONSULTANT AND ANY TEST RESULTS WILL BE SENT TO YOUR REFERRING PHYSICIAN.  You saw Dr.Penland today. Will refer to Dr. Arnoldo Morale for port removal. Follow up in 4 months with labs. See Amy at checkout for appointments.  Thank you for choosing [place] at [facility] to provide your oncology and hematology care.  To afford each patient quality time with our provider, please arrive at least 15 minutes before your scheduled appointment time.   Beginning January 23rd 2017 lab work for the [facility] will be done in the  Main lab at [facility] on 1st floor. If you have a lab appointment with the [place] please come in thru the  Main Entrance and check in at the main information desk  You need to re-schedule your appointment should you arrive 10 or more minutes late.  We strive to give you quality time with our providers, and arriving late affects you and other patients whose appointments are after yours.  Also, if you no show three or more times for appointments you may be dismissed from the clinic at the providers discretion.     Again, thank you for choosing [facility].  Our hope is that these requests will decrease the amount of time that you wait before being seen by our physicians.       _____________________________________________________________  Should you have questions after your visit to [facility], please contact our office at [phone] between the hours of 8:30 a.m. and 4:30 p.m.  Voicemails left after 4:30 p.m. will not be returned until the following business day.  For prescription refill requests, have your pharmacy contact our office.         Resources For Cancer Patients and their Caregivers ? American Cancer Society: Can assist with transportation, wigs, general needs, runs Look Good Feel Better.         (385)092-2784 ? Cancer Care: Provides financial assistance, online support groups, medication/co-pay assistance.  1-800-813-HOPE (937) 707-1011) ? [facility] Assists [place] cancer patients and their families through emotional , educational and financial support.  519-235-8084 ? [place] DSS Where to apply for food stamps, Medicaid and utility assistance. 540 852 4351 ? [facility]: Transportation to medical appointments. (650) 603-3827 ? Social Security Administration: May apply for disability if have a Stage IV cancer. 430-738-8488 (442) 303-7166 ? [employer], Disability and Transit Services: Assists with nutrition, care and transit needs. [place] Support Programs: @10RELATIVEDAYS @ > Cancer Support Group  2nd Tuesday of the month 1pm-2pm, Journey Room  > Creative Journey  3rd Tuesday of the month 1130am-1pm, Journey Room  > Look Good Feel Better  1st Wednesday of the month 10am-12 noon, Journey Room (Call [place] to register [phone])

## 2016-08-12 IMAGING — NM NM BONE WHOLE BODY
2 series · 2 of 2 positions shown · non-contrast
Comparison: Pelvic CT of 12/20/2014

CLINICAL DATA: Lytic bone lesions in pelvis on CT. Lymphadenopathy
in inguinal regions. Rule out multiple myeloma.

EXAM:
NUCLEAR MEDICINE WHOLE BODY BONE SCAN
TECHNIQUE: Whole body anterior and posterior images were obtained approximately
3 hours after intravenous injection of radiopharmaceutical.
RADIOPHARMACEUTICALS:  24.0 3echnetium-44m MDP IV

[Series 1: whole body · 2.66mm/px · 1 of 1 slices shown (1 of 2)]
[im 1/1]
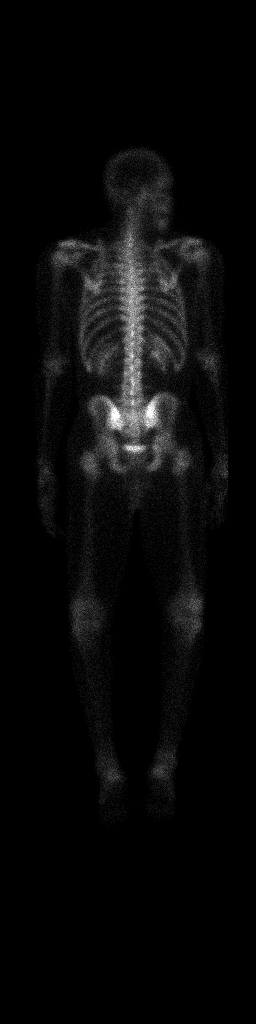

[Series 1: whole body · 2.66mm/px · 1 of 1 slices shown (2 of 2)]
[im 1/1]
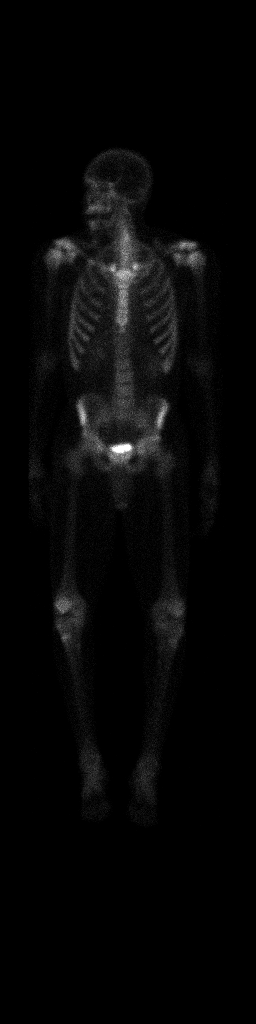

[2 of 2 positions shown; findings below may reference images not displayed]

FINDINGS: No unexpected tracer uptake to suggest osseous metastasis. No pelvic
uptake to correspond to the CT abnormalities. Physiologic tracer
excretion from the kidneys and within the urinary bladder.
IMPRESSION: No evidence of bone metastasis and no correlate for the CT
abnormality. As multiple myeloma can be a false-negative of bone
scans, this remains a consideration. Consider chest/ abdominal CT
and possibly tissue sampling.

## 2016-09-02 NOTE — H&P (Signed)
  NTS SOAP Note  Vital Signs:  Vitals as of: 0000000: Systolic 123456: Diastolic 63: Heart Rate 67: Temp 101.26F (Temporal): Height 10ft 9in: Weight 172Lbs 0 Ounces: BMI 25.4   BMI : 25.4 kg/m2  Subjective: This 46 year old male presents for of need for portacath removal.  Placed by myself in 2016 for hodkin's lymphoma.  Finished with chemotherapy.  Referred by Oncology.  Review of Symptoms:  Constitutional:negative Head:negative Eyes:negative Nose/Mouth/Throat:negative Cardiovascular:negative Respiratory:negative Gastrointestinnegative Genitourinary:negative Musculoskeletal:negative Skin:negative Hematolgic/Lymphatic:negative Allergic/Immunologic:negative   Past Medical History:Reviewed  Past Medical History  Surgical History: port placement 2016, left inguinal lymph node biopsy 2016 Medical Problems: lymphoma, Hodkin's Allergies: clindamycin Medications: elavil, celexa, percocet, ativan, zofran   Social History:Reviewed  Social History  Preferred Language: English Race:  Black or African American Ethnicity: Not Hispanic / Latino Age: 7 year Marital Status:  S Alcohol: socially   Smoking Status: Current every day smoker reviewed on 09/02/2016 Started Date:  Packs per week:  Functional Status reviewed on 09/02/2016 ------------------------------------------------ Bathing: Normal Cooking: Normal Dressing: Normal Driving: Normal Eating: Normal Managing Meds: Normal Oral Care: Normal Shopping: Normal Toileting: Normal Transferring: Normal Walking: Normal Cognitive Status reviewed on 09/02/2016 ------------------------------------------------ Attention: Normal Decision Making: Normal Language: Normal Memory: Normal Motor: Normal Perception: Normal Problem Solving: Normal Visual and Spatial: Normal   Family History:Reviewed  Family Health History Mother, Living; Seizure disorder or epilepsy;  Father, Living; Diabetes  mellitus, unspecified type;     Objective Information: General:Well appearing, well nourished in no distress. Portacath in place left upper chest Head:Atraumatic; no masses; no abnormalities Heart:RRR, no murmur or gallop.  Normal S1, S2.  No S3, S4.  Lungs:CTA bilaterally, no wheezes, rhonchi, rales.  Breathing unlabored. Dr. Donald Pore notes reviewed Assessment:Hddgkin's lymphoma, finished with chemotherapy  Diagnoses: V10.72  Z85.71 History of Hodgkin lymphoma (Personal history of Hodgkin lymphoma)  Procedures: [id number] - OFFICE OUTPATIENT VISIT 15 MINUTES    Plan:  Scheduled for portacath removal in minor procedure room on 09/08/16   Patient Education:Alternative treatments to surgery were discussed with patient (and family).Risks and benefits  of procedure were fully explained to the patient (and family) who gave informed consent. Patient/family questions were addressed.  Follow-up:Pending Surgery

## 2016-09-04 ENCOUNTER — Encounter (HOSPITAL_COMMUNITY): Payer: Self-pay | Admitting: Hematology & Oncology

## 2016-09-08 ENCOUNTER — Encounter (HOSPITAL_COMMUNITY)
Admission: RE | Disposition: A | Discharge status: Home / Self Care | Payer: Self-pay | Source: Ambulatory Visit | Attending: General Surgery

## 2016-09-08 ENCOUNTER — Ambulatory Visit (HOSPITAL_COMMUNITY)
Admission: RE | Admit: 2016-09-08 | Discharge: 2016-09-08 | Disposition: A | Discharge status: Home / Self Care | Payer: Medicaid Other | Source: Ambulatory Visit | Attending: General Surgery | Admitting: General Surgery

## 2016-09-08 DIAGNOSIS — Z9221 Personal history of antineoplastic chemotherapy: Secondary | ICD-10-CM | POA: Diagnosis not present

## 2016-09-08 DIAGNOSIS — Z881 Allergy status to other antibiotic agents status: Secondary | ICD-10-CM | POA: Insufficient documentation

## 2016-09-08 DIAGNOSIS — Z8571 Personal history of Hodgkin lymphoma: Secondary | ICD-10-CM | POA: Insufficient documentation

## 2016-09-08 DIAGNOSIS — Z820 Family history of epilepsy and other diseases of the nervous system: Secondary | ICD-10-CM | POA: Insufficient documentation

## 2016-09-08 DIAGNOSIS — Z833 Family history of diabetes mellitus: Secondary | ICD-10-CM | POA: Insufficient documentation

## 2016-09-08 DIAGNOSIS — Z79899 Other long term (current) drug therapy: Secondary | ICD-10-CM | POA: Diagnosis not present

## 2016-09-08 DIAGNOSIS — F17200 Nicotine dependence, unspecified, uncomplicated: Secondary | ICD-10-CM | POA: Diagnosis not present

## 2016-09-08 DIAGNOSIS — Z452 Encounter for adjustment and management of vascular access device: Secondary | ICD-10-CM | POA: Insufficient documentation

## 2016-09-08 HISTORY — PX: PORT-A-CATH REMOVAL: SHX5289

## 2016-09-08 SURGERY — MINOR REMOVAL PORT-A-CATH
Anesthesia: LOCAL | Site: Chest | Laterality: Left

## 2016-09-08 MED ORDER — LIDOCAINE HCL (PF) 1 % IJ SOLN
INTRAMUSCULAR | Status: DC | PRN
  Administered 2016-09-08: 3 mL

## 2016-09-08 MED ORDER — LIDOCAINE HCL (PF) 1 % IJ SOLN
INTRAMUSCULAR | Status: AC
  Filled 2016-09-08: qty 30

## 2016-09-08 SURGICAL SUPPLY — 19 items
CLOTH BEACON ORANGE TIMEOUT ST (SAFETY) ×2 IMPLANT
DECANTER SPIKE VIAL GLASS SM (MISCELLANEOUS) IMPLANT
DRAPE PROXIMA HALF (DRAPES) ×2 IMPLANT
DURAPREP 6ML APPLICATOR 50/CS (WOUND CARE) ×2 IMPLANT
ELECT REM PT RETURN 9FT ADLT (ELECTROSURGICAL)
ELECTRODE REM PT RTRN 9FT ADLT (ELECTROSURGICAL) IMPLANT
GLOVE BIOGEL PI IND STRL 7.0 (GLOVE) ×1 IMPLANT
GLOVE BIOGEL PI INDICATOR 7.0 (GLOVE) ×1
GLOVE SURG SS PI 7.5 STRL IVOR (GLOVE) ×2 IMPLANT
GOWN STRL REUS W/TWL LRG LVL3 (GOWN DISPOSABLE) ×2 IMPLANT
LIQUID BAND (GAUZE/BANDAGES/DRESSINGS) IMPLANT
NEEDLE HYPO 25X1 1.5 SAFETY (NEEDLE) ×2 IMPLANT
PENCIL HANDSWITCHING (ELECTRODE) IMPLANT
SPONGE GAUZE 2X2 8PLY STRL LF (GAUZE/BANDAGES/DRESSINGS) IMPLANT
SUT VIC AB 3-0 SH 27 (SUTURE) ×1
SUT VIC AB 3-0 SH 27X BRD (SUTURE) ×1 IMPLANT
SUT VIC AB 4-0 PS2 27 (SUTURE) ×2 IMPLANT
SYR CONTROL 10ML LL (SYRINGE) ×2 IMPLANT
TOWEL OR 17X26 4PK STRL BLUE (TOWEL DISPOSABLE) IMPLANT

## 2016-09-08 NOTE — Discharge Instructions (Signed)
Implanted Port Removal, Care After Introduction Refer to this sheet in the next few weeks. These instructions provide you with information about caring for yourself after your procedure. Your health care provider may also give you more specific instructions. Your treatment has been planned according to current medical practices, but problems sometimes occur. Call your health care provider if you have any problems or questions after your procedure. What can I expect after the procedure? After the procedure, it is common to have:  Soreness or pain near your incision.  Some swelling or bruising near your incision. Follow these instructions at home: Medicines  Take over-the-counter and prescription medicines only as told by your health care provider.  If you were prescribed an antibiotic medicine, take it as told by your health care provider. Do not stop taking the antibiotic even if you start to feel better. Bathing  Do not take baths, swim, or use a hot tub until your health care provider approves. Ask your health care provider if you can take showers. You may only be allowed to take sponge baths for bathing. Incision care  Follow instructions from your health care provider about how to take care of your incision. Make sure you:  Wash your hands with soap and water before you change your bandage (dressing). If soap and water are not available, use hand sanitizer.  Change your dressing as told by your health care provider.  Keep your dressing dry.  Leave stitches (sutures), skin glue, or adhesive strips in place. These skin closures may need to stay in place for 2 weeks or longer. If adhesive strip edges start to loosen and curl up, you may trim the loose edges. Do not remove adhesive strips completely unless your health care provider tells you to do that.  Check your incision area every day for signs of infection. Check for:  More redness, swelling, or pain.  More fluid or  blood.  Warmth.  Pus or a bad smell. Driving  If you received a sedative, do not drive for 24 hours after the procedure.  If you did not receive a sedative, ask your health care provider when it is safe to drive. Activity  Return to your normal activities as told by your health care provider. Ask your health care provider what activities are safe for you.  Until your health care provider says it is safe:  Do not lift anything that is heavier than 10 lb (4.5 kg).  Do not do activities that involve lifting your arms over your head. General instructions  Do not use any tobacco products, such as cigarettes, chewing tobacco, and e-cigarettes. Tobacco can delay healing. If you need help quitting, ask your health care provider.  Keep all follow-up visits as told by your health care provider. This is important. Contact a health care provider if:  You have more redness, swelling, or pain around your incision.  You have more fluid or blood coming from your incision.  Your incision feels warm to the touch.  You have pus or a bad smell coming from your incision.  You have a fever.  You have pain that is not relieved by your pain medicine. Get help right away if:  You have chest pain.  You have difficulty breathing. This information is not intended to replace advice given to you by your health care provider. Make sure you discuss any questions you have with your health care provider. Document Released: 07/28/2015 Document Revised: 01/22/2016 Document Reviewed: 05/21/2015  2017 [employer]vier

## 2016-09-08 NOTE — Progress Notes (Signed)
Removal of port-a-cath left chest, patient tolerated procedure well, dermabond applied to left chest, patient states no pain, patient given icepack to apply to left chest and discharge instructions to call if any questions or problems. Discharged patient via ambulation.

## 2016-09-08 NOTE — Op Note (Signed)
Patient:  ATHA ELIZALDE  DOB:  1970-12-05  MRN:  [id number]   Preop Diagnosis: History of Hodgkin's Lymphoma, finished with chemotherapy  Postop Diagnosis:  Same  Procedure:  Removal of Port-A-Cath  Surgeon:  Aviva Signs, M.D.  Anes:  Local  Indications:  Patient is a 46 year old black male who presents for removal of Port-A-Cath. The risks and benefits of the procedure were fully explained to the patient, who gave informed consent.  Procedure note:  The patient was placed in the supine position. The left upper chest was prepped and draped using usual sterile technique with DuraPrep. Surgical site confirmation was performed. 1% Xylocaine was used for local anesthesia.  An incision was made to the previous surgical incision site in the left upper chest. The dissection was taken down to the port. The Port-A-Cath was removed in total without difficulty. It was disposed of. The subcutaneous layer was reapproximated using 3-0 Vicryl interrupted suture. The skin was closed using a 4-0 Vicryl subcuticular suture. Dermabond was applied.  All tape and needle counts were correct at the end of the procedure. The patient was discharged in stable condition.  Complications:  None  EBL:  Minimal  Specimen:  None

## 2016-09-08 NOTE — Interval H&P Note (Signed)
History and Physical Interval Note:  09/08/2016 8:41 AM  Robert Acevedo  has presented today for surgery, with the diagnosis of personal history of lymphoma  The various methods of treatment have been discussed with the patient and family. After consideration of risks, benefits and other options for treatment, the patient has consented to  Procedure(s): MINOR REMOVAL PORT-A-CATH (N/A) as a surgical intervention .  The patient's history has been reviewed, patient examined, no change in status, stable for surgery.  I have reviewed the patient's chart and labs.  Questions were answered to the patient's satisfaction.     Aviva Signs A

## 2016-09-10 ENCOUNTER — Encounter (HOSPITAL_COMMUNITY): Payer: Self-pay | Admitting: General Surgery

## 2016-09-28 ENCOUNTER — Encounter (HOSPITAL_COMMUNITY): Payer: Medicaid Other

## 2016-11-04 ENCOUNTER — Emergency Department (HOSPITAL_COMMUNITY): Payer: Medicaid Other

## 2016-11-04 ENCOUNTER — Encounter (HOSPITAL_COMMUNITY): Payer: Self-pay | Admitting: Emergency Medicine

## 2016-11-04 ENCOUNTER — Emergency Department (HOSPITAL_COMMUNITY)
Admission: EM | Admit: 2016-11-04 | Discharge: 2016-11-04 | Discharge status: Against Medical Advice | Payer: Medicaid Other | Attending: Emergency Medicine | Admitting: Emergency Medicine

## 2016-11-04 DIAGNOSIS — R1032 Left lower quadrant pain: Secondary | ICD-10-CM | POA: Diagnosis not present

## 2016-11-04 DIAGNOSIS — R21 Rash and other nonspecific skin eruption: Secondary | ICD-10-CM | POA: Diagnosis not present

## 2016-11-04 DIAGNOSIS — H538 Other visual disturbances: Secondary | ICD-10-CM | POA: Diagnosis not present

## 2016-11-04 DIAGNOSIS — F17210 Nicotine dependence, cigarettes, uncomplicated: Secondary | ICD-10-CM | POA: Insufficient documentation

## 2016-11-04 LAB — CBC
HCT: 41.9 % (ref 39.0–52.0)
HEMOGLOBIN: 13.8 g/dL (ref 13.0–17.0)
MCH: 28.9 pg (ref 26.0–34.0)
MCHC: 32.9 g/dL (ref 30.0–36.0)
MCV: 87.7 fL (ref 78.0–100.0)
PLATELETS: 188 10*3/uL (ref 150–400)
RBC: 4.78 MIL/uL (ref 4.22–5.81)
RDW: 15.3 % (ref 11.5–15.5)
WBC: 4.9 10*3/uL (ref 4.0–10.5)

## 2016-11-04 LAB — COMPREHENSIVE METABOLIC PANEL
ALK PHOS: 46 U/L (ref 38–126)
ALT: 15 U/L — AB (ref 17–63)
AST: 24 U/L (ref 15–41)
Albumin: 3.6 g/dL (ref 3.5–5.0)
Anion gap: 6 (ref 5–15)
BILIRUBIN TOTAL: 0.5 mg/dL (ref 0.3–1.2)
BUN: 10 mg/dL (ref 6–20)
CALCIUM: 9 mg/dL (ref 8.9–10.3)
CO2: 30 mmol/L (ref 22–32)
CREATININE: 1.04 mg/dL (ref 0.61–1.24)
Chloride: 99 mmol/L — ABNORMAL LOW (ref 101–111)
GFR calc Af Amer: >60 mL/min (ref >60)
GFR calc non Af Amer: >60 mL/min (ref >60)
GLUCOSE: 99 mg/dL (ref 65–99)
Potassium: 3.7 mmol/L (ref 3.5–5.1)
Sodium: 135 mmol/L (ref 135–145)
Total Protein: 8.8 g/dL — ABNORMAL HIGH (ref 6.5–8.1)

## 2016-11-04 LAB — LIPASE, BLOOD: Lipase: 12 U/L (ref 11–51)

## 2016-11-04 MED ORDER — SODIUM CHLORIDE 0.9 % IV BOLUS (SEPSIS)
500.0000 mL | Freq: Once | INTRAVENOUS | Status: AC
  Administered 2016-11-04: 500 mL via INTRAVENOUS

## 2016-11-04 MED ORDER — IOPAMIDOL (ISOVUE-300) INJECTION 61%
100.0000 mL | Freq: Once | INTRAVENOUS | Status: AC | PRN
  Administered 2016-11-04: 100 mL via INTRAVENOUS

## 2016-11-04 MED ORDER — IOPAMIDOL (ISOVUE-300) INJECTION 61%
INTRAVENOUS | Status: AC
  Filled 2016-11-04: qty 30

## 2016-11-04 MED ORDER — PROMETHAZINE HCL 25 MG PO TABS: 25.0000 mg | ORAL_TABLET | Freq: Four times a day (QID) | ORAL | 1 refills | Status: DC | PRN

## 2016-11-04 MED ORDER — HYDROMORPHONE HCL 1 MG/ML IJ SOLN
1.0000 mg | Freq: Once | INTRAMUSCULAR | Status: AC
  Administered 2016-11-04: 1 mg via INTRAVENOUS
  Filled 2016-11-04: qty 1

## 2016-11-04 MED ORDER — SODIUM CHLORIDE 0.9 % IV SOLN
INTRAVENOUS | Status: DC
  Administered 2016-11-04: 10:00:00 via INTRAVENOUS

## 2016-11-04 MED ORDER — ONDANSETRON HCL 4 MG/2ML IJ SOLN
4.0000 mg | Freq: Once | INTRAMUSCULAR | Status: AC
  Administered 2016-11-04: 4 mg via INTRAVENOUS
  Filled 2016-11-04: qty 2

## 2016-11-04 MED ORDER — HYDROCODONE-ACETAMINOPHEN 5-325 MG PO TABS: 1.0000 | ORAL_TABLET | Freq: Four times a day (QID) | ORAL | 0 refills | Status: DC | PRN

## 2016-11-04 NOTE — ED Notes (Signed)
Patient seen walking down hallway walking to exit holding [place] on arm. Steady gait with no difficulties noted.

## 2016-11-04 NOTE — ED Provider Notes (Signed)
[facility] DEPT Provider Note   CSN: [id number] Arrival date & time: 11/04/16  9357  By signing my name below, I, [name], attest that this documentation has been prepared under the direction and in the presence of [name], MD. Electronically Signed: [name], Scribe. 11/04/16. 10:04 AM. History   Chief Complaint Chief Complaint  Patient presents with  . Abdominal Pain    The history is provided by the patient. No language interpreter was used.  Abdominal Pain   This is a new problem. The pain is located in the LLQ. The quality of the pain is aching. The pain is at a severity of 9/10. Pertinent negatives include fever, diarrhea, nausea, vomiting, dysuria, hematuria, headaches and myalgias.  HPI Comments: Robert Acevedo is a 46 y.o. male who presents to the Emergency Department complaining of LLq pain that began around 5 pm yesterday. He states that the pain began in his epigastric region and radiated to his LLQ. He rates the pain 10/10. He states that he had similar pain a few weeks ago. His last BM was yesterday and it was normal. He denies fever, nausea, vomiting, and diarrhea. He states that he recently had a port removed around 4 weeks ago from where he had cancer. He reports no alleviating factors.   Past Medical History:  Diagnosis Date  . Bronchitis   . Hodgkin's lymphoma ([place])   . Lymphadenopathy, inguinal    left    Patient Active Problem List   Diagnosis Date Noted  . Port catheter in place 02/05/2016  . Uveitis 08/15/2015  . Hodgkin lymphoma ([place]) 01/23/2015  . Lytic bone lesion of hip   . Abnormal presence of protein in urine     Past Surgical History:  Procedure Laterality Date  . BONE MARROW ASPIRATION Left 01/20/15  . BONE MARROW BIOPSY Left 01/20/15  . INCISION AND DRAINAGE PERITONSILLAR ABSCESS    . KNEE SURGERY    . LYMPH NODE BIOPSY Left 01/13/2015   Procedure: LEFT INGUINAL LYMPH NODE BIOPSY;  Surgeon: Aviva Signs Md, MD;   Location: [facility];  Service: General;  Laterality: Left;  . PORT-A-CATH REMOVAL Left 09/08/2016   Procedure: MINOR REMOVAL PORT-A-CATH;  Surgeon: Aviva Signs, MD;  Location: [facility];  Service: General;  Laterality: Left;  . PORTACATH PLACEMENT Left 01/31/2015   Procedure: INSERTION PORT-A-CATH;  Surgeon: Aviva Signs Md, MD;  Location: [facility];  Service: General;  Laterality: Left;  . WRIST SURGERY     right       Home Medications    Prior to Admission medications   Medication Sig Start Date End Date Taking? Authorizing Provider  LORazepam (ATIVAN) 0.5 MG tablet Take 1 tablet (0.5 mg total) by mouth every 8 (eight) hours as needed for anxiety. 01/24/15  Yes Baird Cancer, PA-C  amitriptyline (ELAVIL) 25 MG tablet Take one tablet at bedtime for 5 days then increase to two tablets thereafter Patient not taking: Reported on 11/04/2016 04/15/15   Patrici Ranks, MD  citalopram (CELEXA) 20 MG tablet Take 1 tablet daily x 7 days. Then increase to 2 tablets daily. Patient not taking: Reported on 11/04/2016 03/04/15   Patrici Ranks, MD  HYDROcodone-acetaminophen (NORCO/VICODIN) 5-325 MG tablet Take 1-2 tablets by mouth every 6 (six) hours as needed. 11/04/16   [name], MD  lidocaine-prilocaine (EMLA) cream Apply a quarter size amount to port site 1 hour prior to chemo. Do not rub in. Cover with plastic wrap. Patient not taking: Reported on  11/04/2016 05/27/15   Patrici Ranks, MD  omeprazole (PRILOSEC) 40 MG capsule Take 1 capsule (40 mg total) by mouth daily. Patient not taking: Reported on 11/04/2016 03/04/15   Patrici Ranks, MD  promethazine (PHENERGAN) 25 MG tablet Take 1 tablet (25 mg total) by mouth every 6 (six) hours as needed. 11/04/16   [name], MD    Family History Family History  Problem Relation Age of Onset  . Seizures Mother   . Diabetes Father     Social History Social History  Substance Use Topics  . Smoking status: Current Every Day Smoker    Packs/day: 0.50     Years: 2.00    Types: Cigarettes  . Smokeless tobacco: Never Used  . Alcohol use Yes     Comment: occasional     Allergies   Clindamycin/lincomycin   Review of Systems Review of Systems  Constitutional: Positive for chills. Negative for fever.  HENT: Negative for rhinorrhea and sore throat.   Eyes: Positive for visual disturbance (blurred vision).  Respiratory: Negative for cough and shortness of breath.   Cardiovascular: Negative for chest pain.  Gastrointestinal: Positive for abdominal pain. Negative for diarrhea, nausea and vomiting.  Genitourinary: Negative for dysuria and hematuria.  Musculoskeletal: Negative for joint swelling and myalgias.  Skin: Positive for rash.  Neurological: Negative for headaches.  Hematological: Does not bruise/bleed easily.  Psychiatric/Behavioral: Negative for confusion.  All other systems reviewed and are negative.   Physical Exam Updated Vital Signs BP 119/82   Pulse (!) 38   Temp 97.8 F (36.6 C) (Oral)   Resp 18   Ht '5\' 9"'  (1.753 m)   Wt 78.5 kg   SpO2 (!) 66%   BMI 25.55 kg/m   Physical Exam  Constitutional: He is oriented to person, place, and time. He appears well-developed and well-nourished.  HENT:  Head: Normocephalic and atraumatic.  Mouth/Throat: Oropharynx is clear and moist.  Eyes: EOM are normal. Pupils are equal, round, and reactive to light. No scleral icterus.  Neck: Normal range of motion.  Cardiovascular: Normal rate, regular rhythm, normal heart sounds and intact distal pulses.   Pulmonary/Chest: Effort normal and breath sounds normal. No respiratory distress.  Abdominal: Soft. Bowel sounds are normal. He exhibits no distension. There is tenderness.  Tenderness in LLQ. There is an area around about 5 cm and there is no vesicles noted.  Musculoskeletal: Normal range of motion.  No swelling in ankles.  Neurological: He is alert and oriented to person, place, and time.  Skin: Skin is warm and dry.    Psychiatric: He has a normal mood and affect. Judgment normal.  Nursing note and vitals reviewed.   ED Treatments / Results  DIAGNOSTIC STUDIES: Oxygen Saturation is 100% on RA, normal by my interpretation.    COORDINATION OF CARE: 9:40 AM Discussed treatment plan with pt at bedside and pt agreed to plan. I will check the patient's CT scan.  Labs (all labs ordered are listed, but only abnormal results are displayed) Labs Reviewed  COMPREHENSIVE METABOLIC PANEL - Abnormal; Notable for the following:       Result Value   Chloride 99 (*)    Total Protein 8.8 (*)    ALT 15 (*)    All other components within normal limits  LIPASE, BLOOD  CBC  URINALYSIS, ROUTINE W REFLEX MICROSCOPIC    EKG  EKG Interpretation None       Radiology Ct Abdomen Pelvis W Contrast  Result Date: 11/04/2016  CLINICAL DATA:  Acute onset left lower quadrant pain at 5:30 p.m. yesterday. No known injury. History of lymphoma. EXAM: CT ABDOMEN AND PELVIS WITH CONTRAST TECHNIQUE: Multidetector CT imaging of the abdomen and pelvis was performed using the standard protocol following bolus administration of intravenous contrast. CONTRAST:  100 ml ISOVUE-300 IOPAMIDOL (ISOVUE-300) INJECTION 61% COMPARISON:  CT chest, abdomen and pelvis 08/05/2016 and 01/05/2016. FINDINGS: Lower chest: Mild dependent atelectasis is seen in the lung bases. No pleural or pericardial no pleural effusion. Small to moderate pericardial effusion is unchanged. Hepatobiliary: No focal liver abnormality is seen. No gallstones, gallbladder wall thickening, or biliary dilatation. Pancreas: Unremarkable. No pancreatic ductal dilatation or surrounding inflammatory changes. Spleen: Normal in size without focal abnormality. Adrenals/Urinary Tract: Adrenal glands are unremarkable. Kidneys are normal, without renal calculi, focal lesion, or hydronephrosis. Bladder is unremarkable. Stomach/Bowel: Stomach is within normal limits. Appendix appears normal. No  evidence of bowel wall thickening, distention, or inflammatory changes. Vascular/Lymphatic: Retroaortic left renal vein is noted. No atherosclerosis. No lymphadenopathy. Reproductive: Prostate is unremarkable. Other: No abdominal wall hernia or abnormality. No abdominopelvic ascites. Musculoskeletal: Scattered lucent lesions throughout the pelvis and lower lumbar spine are unchanged. IMPRESSION: No acute abnormality or finding to explain the patient's symptoms. No change since the prior exams. Electronically Signed   By: Inge Rise M.D.   On: 11/04/2016 12:30    Procedures Procedures (including critical care time)  Medications Ordered in ED Medications  0.9 %  sodium chloride infusion ( Intravenous New Bag/Given 11/04/16 1014)  iopamidol (ISOVUE-300) 61 % injection (not administered)  HYDROmorphone (DILAUDID) injection 1 mg (1 mg Intravenous Given 11/04/16 1014)  sodium chloride 0.9 % bolus 500 mL (0 mLs Intravenous Stopped 11/04/16 1144)  ondansetron (ZOFRAN) injection 4 mg (4 mg Intravenous Given 11/04/16 1014)  iopamidol (ISOVUE-300) 61 % injection 100 mL (100 mLs Intravenous Contrast Given 11/04/16 1159)     Initial Impression / Assessment and Plan / ED Course  I have reviewed the triage vital signs and the nursing notes.  Pertinent labs & imaging results that were available during my care of the patient were reviewed by me and considered in my medical decision making (see chart for details).     Workup for the left lower quadrant abdominal pain without any acute findings. Patient be treated symptomatically.  Final Clinical Impressions(s) / ED Diagnoses   Final diagnoses:  Left lower quadrant pain    New Prescriptions New Prescriptions   HYDROCODONE-ACETAMINOPHEN (NORCO/VICODIN) 5-325 MG TABLET    Take 1-2 tablets by mouth every 6 (six) hours as needed.   PROMETHAZINE (PHENERGAN) 25 MG TABLET    Take 1 tablet (25 mg total) by mouth every 6 (six) hours as needed.   I personally  performed the services described in this documentation, which was scribed in my presence. The recorded information has been reviewed and is accurate.       [name], MD 11/04/16 1315

## 2016-11-04 NOTE — ED Triage Notes (Signed)
PT c/o middle/upper abdominal discomfort and bloating with no n/v/d that started yesterday evening after eating a hamburger per the pt. PT denies any urinary symptoms and states last BM yesterday and was normal.

## 2016-11-04 NOTE — Discharge Instructions (Signed)
Take medicines as directed. Pain medicine as needed take the antinausea medicine as directed. Workup here today without any acute findings. Return for any new or worse symptoms.

## 2016-11-04 NOTE — ED Notes (Signed)
Lavenia Atlas, RN informed this RN that pt left hospital. Hospital gown found laying on stretcher, pt not present in his room at this time.

## 2016-11-07 ENCOUNTER — Emergency Department (HOSPITAL_COMMUNITY): Payer: Medicaid Other

## 2016-11-07 ENCOUNTER — Encounter (HOSPITAL_COMMUNITY): Payer: Self-pay | Admitting: Emergency Medicine

## 2016-11-07 ENCOUNTER — Emergency Department (HOSPITAL_COMMUNITY)
Admission: EM | Admit: 2016-11-07 | Discharge: 2016-11-07 | Disposition: A | Discharge status: Home / Self Care | Payer: Medicaid Other | Attending: Emergency Medicine | Admitting: Emergency Medicine

## 2016-11-07 DIAGNOSIS — Y929 Unspecified place or not applicable: Secondary | ICD-10-CM | POA: Diagnosis not present

## 2016-11-07 DIAGNOSIS — W108XXA Fall (on) (from) other stairs and steps, initial encounter: Secondary | ICD-10-CM | POA: Insufficient documentation

## 2016-11-07 DIAGNOSIS — Z23 Encounter for immunization: Secondary | ICD-10-CM | POA: Insufficient documentation

## 2016-11-07 DIAGNOSIS — F17210 Nicotine dependence, cigarettes, uncomplicated: Secondary | ICD-10-CM | POA: Diagnosis not present

## 2016-11-07 DIAGNOSIS — Y999 Unspecified external cause status: Secondary | ICD-10-CM | POA: Insufficient documentation

## 2016-11-07 DIAGNOSIS — S0181XA Laceration without foreign body of other part of head, initial encounter: Secondary | ICD-10-CM | POA: Diagnosis not present

## 2016-11-07 DIAGNOSIS — S0990XA Unspecified injury of head, initial encounter: Secondary | ICD-10-CM | POA: Diagnosis present

## 2016-11-07 DIAGNOSIS — Y9301 Activity, walking, marching and hiking: Secondary | ICD-10-CM | POA: Diagnosis not present

## 2016-11-07 DIAGNOSIS — S0993XA Unspecified injury of face, initial encounter: Secondary | ICD-10-CM

## 2016-11-07 MED ORDER — CEPHALEXIN 500 MG PO CAPS: 500.0000 mg | ORAL_CAPSULE | Freq: Four times a day (QID) | ORAL | 0 refills | Status: DC

## 2016-11-07 MED ORDER — TETANUS-DIPHTH-ACELL PERTUSSIS 5-2.5-18.5 LF-MCG/0.5 IM SUSP
0.5000 mL | Freq: Once | INTRAMUSCULAR | Status: AC
  Administered 2016-11-07: 0.5 mL via INTRAMUSCULAR
  Filled 2016-11-07: qty 0.5

## 2016-11-07 MED ORDER — LIDOCAINE-EPINEPHRINE (PF) 2 %-1:200000 IJ SOLN
20.0000 mL | Freq: Once | INTRAMUSCULAR | Status: AC
  Administered 2016-11-07: 20 mL
  Filled 2016-11-07: qty 20

## 2016-11-07 NOTE — Discharge Instructions (Signed)
Follow-up for suture removal in 5-7 days. You should see a dentist regarding your gingival injury. Your teeth could be at risk for infection or loss. Return to the ED if he develop new or worsening symptoms. Take antibiotics as prescribed.

## 2016-11-07 NOTE — ED Triage Notes (Signed)
Pt tripped while walking up steps and fell hitting his chin on steps. Pt with laceration to chin.

## 2016-11-07 NOTE — ED Provider Notes (Signed)
[facility] DEPT Provider Note   CSN: [id number] Arrival date & time: 11/07/16  0514     History   Chief Complaint Chief Complaint  Patient presents with  . Facial Laceration    HPI Robert Acevedo is a 46 y.o. male.  Patient sustained a fall while intoxicated and hit his chin on a cement step. Denies loss of consciousness. He is an irregular laceration to his chin that is through and through into the mouth. He feels like his teeth are not lining up properly. Denies any vomiting. Denies any neck pain. No focal weakness, numbness or tingling. Denies any blood thinner use. Does not know his last tetanus shot was.   The history is provided by the patient.    Past Medical History:  Diagnosis Date  . Bronchitis   . Hodgkin's lymphoma ([place])   . Lymphadenopathy, inguinal    left    Patient Active Problem List   Diagnosis Date Noted  . Port catheter in place 02/05/2016  . Uveitis 08/15/2015  . Hodgkin lymphoma ([place]) 01/23/2015  . Lytic bone lesion of hip   . Abnormal presence of protein in urine     Past Surgical History:  Procedure Laterality Date  . BONE MARROW ASPIRATION Left 01/20/15  . BONE MARROW BIOPSY Left 01/20/15  . INCISION AND DRAINAGE PERITONSILLAR ABSCESS    . KNEE SURGERY    . LYMPH NODE BIOPSY Left 01/13/2015   Procedure: LEFT INGUINAL LYMPH NODE BIOPSY;  Surgeon: Aviva Signs Md, MD;  Location: [facility];  Service: General;  Laterality: Left;  . PORT-A-CATH REMOVAL Left 09/08/2016   Procedure: MINOR REMOVAL PORT-A-CATH;  Surgeon: Aviva Signs, MD;  Location: [facility];  Service: General;  Laterality: Left;  . PORTACATH PLACEMENT Left 01/31/2015   Procedure: INSERTION PORT-A-CATH;  Surgeon: Aviva Signs Md, MD;  Location: [facility];  Service: General;  Laterality: Left;  . WRIST SURGERY     right       Home Medications    Prior to Admission medications   Medication Sig Start Date End Date Taking? Authorizing Provider  amitriptyline (ELAVIL) 25 MG tablet  Take one tablet at bedtime for 5 days then increase to two tablets thereafter 04/15/15  Yes Patrici Ranks, MD  citalopram (CELEXA) 20 MG tablet Take 1 tablet daily x 7 days. Then increase to 2 tablets daily. 03/04/15  Yes Patrici Ranks, MD  HYDROcodone-acetaminophen (NORCO/VICODIN) 5-325 MG tablet Take 1-2 tablets by mouth every 6 (six) hours as needed. 11/04/16  Yes [name], MD  lidocaine-prilocaine (EMLA) cream Apply a quarter size amount to port site 1 hour prior to chemo. Do not rub in. Cover with plastic wrap. 05/27/15  Yes Patrici Ranks, MD  LORazepam (ATIVAN) 0.5 MG tablet Take 1 tablet (0.5 mg total) by mouth every 8 (eight) hours as needed for anxiety. 01/24/15  Yes Manon Hilding Kefalas, PA-C  omeprazole (PRILOSEC) 40 MG capsule Take 1 capsule (40 mg total) by mouth daily. 03/04/15  Yes Patrici Ranks, MD  promethazine (PHENERGAN) 25 MG tablet Take 1 tablet (25 mg total) by mouth every 6 (six) hours as needed. 11/04/16  Yes [name], MD    Family History Family History  Problem Relation Age of Onset  . Seizures Mother   . Diabetes Father     Social History Social History  Substance Use Topics  . Smoking status: Current Every Day Smoker    Packs/day: 0.50    Years: 2.00    Types:  Cigarettes  . Smokeless tobacco: Never Used  . Alcohol use Yes     Comment: occasional     Allergies   Clindamycin/lincomycin   Review of Systems Review of Systems  Constitutional: Negative for activity change and appetite change.  HENT: Positive for dental problem.   Respiratory: Negative for cough, chest tightness and shortness of breath.   Cardiovascular: Negative for chest pain.  Gastrointestinal: Negative for abdominal pain, nausea and vomiting.  Genitourinary: Negative for dysuria, hematuria, scrotal swelling and testicular pain.  Musculoskeletal: Negative for arthralgias, back pain and myalgias.  Skin: Positive for wound.  Neurological: Negative for dizziness,  weakness, light-headedness and headaches.  A complete 10 system review of systems was obtained and all systems are negative except as noted in the HPI and PMH.     Physical Exam Updated Vital Signs BP 130/81   Pulse 82   Temp 98.4 F (36.9 C) (Oral)   Resp 20   Ht '5\' 9"'  (1.753 m)   Wt 170 lb (77.1 kg)   SpO2 98%   BMI 25.10 kg/m   Physical Exam  Constitutional: He is oriented to person, place, and time. He appears well-developed and well-nourished. No distress.  HENT:  Head: Normocephalic and atraumatic.  Mouth/Throat: Oropharynx is clear and moist. No oropharyngeal exudate.  Irregular 3 cm linear laceration across mid chin This extends through and through into the oral cavity. No vermilion border involvement. Dentition is not loose. Patient reports sensation of malocclusion but none obvious on exam. The intraoral laceration extends to gingiva which is pulled away from lower teeth as a flap  Eyes: Conjunctivae and EOM are normal. Pupils are equal, round, and reactive to light.  Neck: Normal range of motion. Neck supple.  No C spine tenderness  Cardiovascular: Normal rate, regular rhythm, normal heart sounds and intact distal pulses.   No murmur heard. Pulmonary/Chest: Effort normal and breath sounds normal. No respiratory distress.  Abdominal: Soft. There is no tenderness. There is no rebound and no guarding.  Musculoskeletal: Normal range of motion. He exhibits no edema or tenderness.  Neurological: He is alert and oriented to person, place, and time. No cranial nerve deficit. He exhibits normal muscle tone. Coordination normal.  No ataxia on finger to nose bilaterally. No pronator drift. 5/5 strength throughout. CN 2-12 intact.Equal grip strength. Sensation intact.   Skin: Skin is warm.  Psychiatric: He has a normal mood and affect. His behavior is normal.  Nursing note and vitals reviewed.      ED Treatments / Results  Labs (all labs ordered are listed, but only  abnormal results are displayed) Labs Reviewed - No data to display  EKG  EKG Interpretation None       Radiology Ct Head Wo Contrast  Result Date: 11/07/2016 CLINICAL DATA:  Trip and fall injury while walking up steps. EXAM: CT HEAD WITHOUT CONTRAST CT MAXILLOFACIAL WITHOUT CONTRAST TECHNIQUE: Multidetector CT imaging of the head and maxillofacial structures were performed using the standard protocol without intravenous contrast. Multiplanar CT image reconstructions of the maxillofacial structures were also generated. COMPARISON:  01/27/2015 FINDINGS: CT HEAD FINDINGS Brain: There is no intracranial hemorrhage, mass or evidence of acute infarction. There is no extra-axial fluid collection. Gray matter and white matter appear normal. Cerebral volume is normal for age. Brainstem and posterior fossa are unremarkable. The CSF spaces appear normal. Vascular: No hyperdense vessel or unexpected calcification. Skull: Normal. Negative for fracture or focal lesion. Other: None. CT MAXILLOFACIAL FINDINGS Osseous: No fracture or  mandibular dislocation. No destructive process. Orbits: Negative. No traumatic or inflammatory finding. Sinuses: Right maxillary sinus air-fluid level. Otherwise clear and intact. Soft tissues: Soft tissue irregularity and soft tissue gas consistent with the described chain laceration. No radiopaque foreign material is evident. IMPRESSION: 1. Normal brain 2. Negative for acute maxillofacial fracture.  Mandible is intact. Electronically Signed   By: Andreas Newport M.D.   On: 11/07/2016 06:51   Ct Maxillofacial Wo Contrast  Result Date: 11/07/2016 CLINICAL DATA:  Trip and fall injury while walking up steps. EXAM: CT HEAD WITHOUT CONTRAST CT MAXILLOFACIAL WITHOUT CONTRAST TECHNIQUE: Multidetector CT imaging of the head and maxillofacial structures were performed using the standard protocol without intravenous contrast. Multiplanar CT image reconstructions of the maxillofacial structures  were also generated. COMPARISON:  01/27/2015 FINDINGS: CT HEAD FINDINGS Brain: There is no intracranial hemorrhage, mass or evidence of acute infarction. There is no extra-axial fluid collection. Gray matter and white matter appear normal. Cerebral volume is normal for age. Brainstem and posterior fossa are unremarkable. The CSF spaces appear normal. Vascular: No hyperdense vessel or unexpected calcification. Skull: Normal. Negative for fracture or focal lesion. Other: None. CT MAXILLOFACIAL FINDINGS Osseous: No fracture or mandibular dislocation. No destructive process. Orbits: Negative. No traumatic or inflammatory finding. Sinuses: Right maxillary sinus air-fluid level. Otherwise clear and intact. Soft tissues: Soft tissue irregularity and soft tissue gas consistent with the described chain laceration. No radiopaque foreign material is evident. IMPRESSION: 1. Normal brain 2. Negative for acute maxillofacial fracture.  Mandible is intact. Electronically Signed   By: Andreas Newport M.D.   On: 11/07/2016 06:51    Procedures .Marland KitchenLaceration Repair Date/Time: 11/07/2016 9:20 AM Performed by: Ezequiel Essex Authorized by: Ezequiel Essex   Consent:    Consent obtained:  Verbal   Consent given by:  Patient   Risks discussed:  Infection, need for additional repair, pain, poor cosmetic result and poor wound healing   Alternatives discussed:  No treatment and delayed treatment Anesthesia (see MAR for exact dosages):    Anesthesia method:  Local infiltration   Local anesthetic:  Lidocaine 2% WITH epi Laceration details:    Location:  Face   Face location:  Chin   Length (cm):  3 Repair type:    Repair type:  Complex Pre-procedure details:    Preparation:  Patient was prepped and draped in usual sterile fashion and imaging obtained to evaluate for foreign bodies Exploration:    Hemostasis achieved with:  Direct pressure   Wound exploration: wound explored through full range of motion and entire  depth of wound probed and visualized     Contaminated: no   Treatment:    Area cleansed with:  Betadine   Amount of cleaning:  Extensive   Irrigation solution:  Sterile saline   Irrigation method:  Syringe   Visualized foreign bodies/material removed: no     Debridement:  Minimal   Undermining:  Minimal Mucous membrane repair:    Suture size:  4-0   Suture material:  Vicryl   Suture technique:  Simple interrupted   Number of sutures:  2 Skin repair:    Repair method:  Sutures   Suture size:  5-0   Suture material:  Prolene   Suture technique:  Simple interrupted   Number of sutures:  12 Approximation:    Approximation:  Close   Vermilion border: well-aligned   Post-procedure details:    Dressing:  Non-adherent dressing   Patient tolerance of procedure:  Tolerated well, no immediate  complications   (including critical care time)  Medications Ordered in ED Medications  lidocaine-EPINEPHrine (XYLOCAINE W/EPI) 2 %-1:200000 (PF) injection 20 mL (not administered)  Tdap (BOOSTRIX) injection 0.5 mL (not administered)     Initial Impression / Assessment and Plan / ED Course  I have reviewed the triage vital signs and the nursing notes.  Pertinent labs & imaging results that were available during my care of the patient were reviewed by me and considered in my medical decision making (see chart for details).     Fall with laceration to chin that extends in oral cavity. We'll evaluate for mandible fracture.  CT negative for facial fracture.  Complex laceration repair that is through and through. Skin and mucus membrane repair as above. Lower gingiva pulled away from teeth but dentition intact. Glue used to secure gingiva back to teeth and jaw bone.  D/w patient followup for suture removal in 5-7 days.  Needs to see dentist ASAP to evaluate teeth.  Prophylactic antibiotics given. Return precautions discussed.  Final Clinical Impressions(s) / ED Diagnoses   Final  diagnoses:  Chin laceration, initial encounter  Dental injury, initial encounter    New Prescriptions New Prescriptions   No medications on file     Ezequiel Essex, MD 11/07/16 [phone]

## 2016-11-10 ENCOUNTER — Encounter (HOSPITAL_COMMUNITY): Payer: Self-pay | Admitting: Emergency Medicine

## 2016-11-10 ENCOUNTER — Emergency Department (HOSPITAL_COMMUNITY)
Admission: EM | Admit: 2016-11-10 | Discharge: 2016-11-10 | Disposition: A | Discharge status: Home / Self Care | Payer: Medicaid Other | Attending: Emergency Medicine | Admitting: Emergency Medicine

## 2016-11-10 DIAGNOSIS — Z4801 Encounter for change or removal of surgical wound dressing: Secondary | ICD-10-CM | POA: Diagnosis present

## 2016-11-10 DIAGNOSIS — F17210 Nicotine dependence, cigarettes, uncomplicated: Secondary | ICD-10-CM | POA: Insufficient documentation

## 2016-11-10 DIAGNOSIS — S0181XD Laceration without foreign body of other part of head, subsequent encounter: Secondary | ICD-10-CM | POA: Diagnosis not present

## 2016-11-10 DIAGNOSIS — Z79899 Other long term (current) drug therapy: Secondary | ICD-10-CM | POA: Diagnosis not present

## 2016-11-10 DIAGNOSIS — Z8571 Personal history of Hodgkin lymphoma: Secondary | ICD-10-CM | POA: Insufficient documentation

## 2016-11-10 DIAGNOSIS — Z5189 Encounter for other specified aftercare: Secondary | ICD-10-CM

## 2016-11-10 DIAGNOSIS — X58XXXD Exposure to other specified factors, subsequent encounter: Secondary | ICD-10-CM | POA: Insufficient documentation

## 2016-11-10 DIAGNOSIS — F1290 Cannabis use, unspecified, uncomplicated: Secondary | ICD-10-CM | POA: Diagnosis not present

## 2016-11-10 MED ORDER — HYDROCODONE-ACETAMINOPHEN 5-325 MG PO TABS
2.0000 | ORAL_TABLET | Freq: Once | ORAL | Status: AC
  Administered 2016-11-10: 2 via ORAL
  Filled 2016-11-10: qty 2

## 2016-11-10 MED ORDER — HYDROCODONE-ACETAMINOPHEN 5-325 MG PO TABS: 2.0000 | ORAL_TABLET | ORAL | 0 refills | Status: DC | PRN

## 2016-11-10 NOTE — ED Triage Notes (Signed)
Pt states the laceration to his chin continues to bleed at times.  Was repaired on Sunday.

## 2016-11-10 NOTE — ED Notes (Signed)
Pt states that he can not stand pressure on his chin. He refused to have a pressure dressing applied to his chin or the inside of his mouth.

## 2016-11-10 NOTE — ED Provider Notes (Signed)
[facility] DEPT Provider Note   CSN: [id number] Arrival date & time: 11/10/16  1740     History   Chief Complaint Chief Complaint  Patient presents with  . Wound Check    HPI Robert Acevedo is a 46 y.o. male.  The history is provided by the patient. No language interpreter was used.  Wound Check  This is a recurrent problem. The problem occurs constantly. The problem has been gradually worsening. Nothing aggravates the symptoms. Nothing relieves the symptoms. He has tried nothing for the symptoms. The treatment provided moderate relief.  Pt reports bleeding from laceration and mouth that was sutured 3 days ago.  Pt reports continued pain  Past Medical History:  Diagnosis Date  . Bronchitis   . Hodgkin's lymphoma ([place])   . Lymphadenopathy, inguinal    left    Patient Active Problem List   Diagnosis Date Noted  . Port catheter in place 02/05/2016  . Uveitis 08/15/2015  . Hodgkin lymphoma ([place]) 01/23/2015  . Lytic bone lesion of hip   . Abnormal presence of protein in urine     Past Surgical History:  Procedure Laterality Date  . BONE MARROW ASPIRATION Left 01/20/15  . BONE MARROW BIOPSY Left 01/20/15  . INCISION AND DRAINAGE PERITONSILLAR ABSCESS    . KNEE SURGERY    . LYMPH NODE BIOPSY Left 01/13/2015   Procedure: LEFT INGUINAL LYMPH NODE BIOPSY;  Surgeon: Aviva Signs Md, MD;  Location: [facility];  Service: General;  Laterality: Left;  . PORT-A-CATH REMOVAL Left 09/08/2016   Procedure: MINOR REMOVAL PORT-A-CATH;  Surgeon: Aviva Signs, MD;  Location: [facility];  Service: General;  Laterality: Left;  . PORTACATH PLACEMENT Left 01/31/2015   Procedure: INSERTION PORT-A-CATH;  Surgeon: Aviva Signs Md, MD;  Location: [facility];  Service: General;  Laterality: Left;  . WRIST SURGERY     right       Home Medications    Prior to Admission medications   Medication Sig Start Date End Date Taking? Authorizing Provider  amitriptyline (ELAVIL) 25 MG tablet Take one tablet  at bedtime for 5 days then increase to two tablets thereafter 04/15/15   Patrici Ranks, MD  cephALEXin (KEFLEX) 500 MG capsule Take 1 capsule (500 mg total) by mouth 4 (four) times daily. 11/07/16   Ezequiel Essex, MD  citalopram (CELEXA) 20 MG tablet Take 1 tablet daily x 7 days. Then increase to 2 tablets daily. 03/04/15   Patrici Ranks, MD  HYDROcodone-acetaminophen (NORCO/VICODIN) 5-325 MG tablet Take 2 tablets by mouth every 4 (four) hours as needed. 11/10/16   Fransico Meadow, PA-C  lidocaine-prilocaine (EMLA) cream Apply a quarter size amount to port site 1 hour prior to chemo. Do not rub in. Cover with plastic wrap. 05/27/15   Patrici Ranks, MD  LORazepam (ATIVAN) 0.5 MG tablet Take 1 tablet (0.5 mg total) by mouth every 8 (eight) hours as needed for anxiety. 01/24/15   Baird Cancer, PA-C  omeprazole (PRILOSEC) 40 MG capsule Take 1 capsule (40 mg total) by mouth daily. 03/04/15   Patrici Ranks, MD  promethazine (PHENERGAN) 25 MG tablet Take 1 tablet (25 mg total) by mouth every 6 (six) hours as needed. 11/04/16   [name], MD    Family History Family History  Problem Relation Age of Onset  . Seizures Mother   . Diabetes Father     Social History Social History  Substance Use Topics  . Smoking status: Current Every Day Smoker  Packs/day: 0.50    Years: 2.00    Types: Cigarettes  . Smokeless tobacco: Never Used  . Alcohol use Yes     Comment: occasional     Allergies   Clindamycin/lincomycin   Review of Systems Review of Systems  All other systems reviewed and are negative.    Physical Exam Updated Vital Signs BP 115/58 (BP Location: Left Arm)   Pulse 70   Temp 98 F (36.7 C) (Temporal)   Resp 16   Ht 5' 9" (1.753 m)   Wt 77.1 kg   SpO2 100%   BMI 25.10 kg/m   Physical Exam  Constitutional: He is oriented to person, place, and time. He appears well-developed and well-nourished.  HENT:  Head: Normocephalic.  Bleeding laceration chin.  Bleeding inside mouth    Neurological: He is alert and oriented to person, place, and time.  Skin: Skin is warm.  Psychiatric: He has a normal mood and affect.  Nursing note and vitals reviewed.    ED Treatments / Results  Labs (all labs ordered are listed, but only abnormal results are displayed) Labs Reviewed - No data to display  EKG  EKG Interpretation None       Radiology No results found.  Procedures Procedures (including critical care time)  Medications Ordered in ED Medications  HYDROcodone-acetaminophen (NORCO/VICODIN) 5-325 MG per tablet 2 tablet (2 tablets Oral Given 11/10/16 1930)     Initial Impression / Assessment and Plan / ED Course  I have reviewed the triage vital signs and the nursing notes.  Pertinent labs & imaging results that were available during my care of the patient were reviewed by me and considered in my medical decision making (see chart for details).    Pt refused dressing. Pt given hydrocodone po.  Rx for hydrocodone.  Pt advised he needs to hold pressure.   Final Clinical Impressions(s) / ED Diagnoses   Final diagnoses:  Visit for wound check    New Prescriptions New Prescriptions   HYDROCODONE-ACETAMINOPHEN (NORCO/VICODIN) 5-325 MG TABLET    Take 2 tablets by mouth every 4 (four) hours as needed.     Hollace Kinnier Rupert, PA-C 11/10/16 2050    Merrily Pew, MD 11/10/16 2116

## 2016-11-10 NOTE — Discharge Instructions (Signed)
Apply pressure to bleeding areas.  Return for suture removal in 5 days.

## 2016-11-15 ENCOUNTER — Emergency Department (HOSPITAL_COMMUNITY)
Admission: EM | Admit: 2016-11-15 | Discharge: 2016-11-15 | Disposition: A | Discharge status: Home / Self Care | Payer: Medicaid Other | Attending: Emergency Medicine | Admitting: Emergency Medicine

## 2016-11-15 ENCOUNTER — Encounter (HOSPITAL_COMMUNITY): Payer: Self-pay | Admitting: Emergency Medicine

## 2016-11-15 DIAGNOSIS — F1290 Cannabis use, unspecified, uncomplicated: Secondary | ICD-10-CM | POA: Diagnosis not present

## 2016-11-15 DIAGNOSIS — Z79899 Other long term (current) drug therapy: Secondary | ICD-10-CM | POA: Diagnosis not present

## 2016-11-15 DIAGNOSIS — F17210 Nicotine dependence, cigarettes, uncomplicated: Secondary | ICD-10-CM | POA: Insufficient documentation

## 2016-11-15 DIAGNOSIS — Z4802 Encounter for removal of sutures: Secondary | ICD-10-CM

## 2016-11-15 NOTE — ED Triage Notes (Signed)
Pt reports here to have stitches removed from chin and lower gum. Pt denies any fever or pain at this time.

## 2016-11-15 NOTE — Discharge Instructions (Signed)
Return here if needed

## 2016-11-15 NOTE — ED Provider Notes (Signed)
[facility] DEPT Provider Note   CSN: [id number] Arrival date & time: 11/15/16  1240  By signing my name below, I, Collene Leyden, attest that this documentation has been prepared under the direction and in the presence of Michale Weikel PA-C. Electronically Signed: Collene Leyden, Scribe. 11/15/16. 1:48 PM.  History   Chief Complaint Chief Complaint  Patient presents with  . Suture / Staple Removal    HPI Comments: Nicolae KATHERINE SYME is a 46 y.o. male who presents to the Emergency Department for removal of sutures to the chin, which were placed 10 days ago. Patient is being followed by a dentist for an injury to his gums. Patient denies any fever, nausea, vomiting, facial swelling, or any other signs of infection.   The history is provided by the patient. No language interpreter was used.    Past Medical History:  Diagnosis Date  . Bronchitis   . Hodgkin's lymphoma ([place])   . Lymphadenopathy, inguinal    left    Patient Active Problem List   Diagnosis Date Noted  . Port catheter in place 02/05/2016  . Uveitis 08/15/2015  . Hodgkin lymphoma ([place]) 01/23/2015  . Lytic bone lesion of hip   . Abnormal presence of protein in urine     Past Surgical History:  Procedure Laterality Date  . BONE MARROW ASPIRATION Left 01/20/15  . BONE MARROW BIOPSY Left 01/20/15  . INCISION AND DRAINAGE PERITONSILLAR ABSCESS    . KNEE SURGERY    . LYMPH NODE BIOPSY Left 01/13/2015   Procedure: LEFT INGUINAL LYMPH NODE BIOPSY;  Surgeon: Aviva Signs Md, MD;  Location: [facility];  Service: General;  Laterality: Left;  . PORT-A-CATH REMOVAL Left 09/08/2016   Procedure: MINOR REMOVAL PORT-A-CATH;  Surgeon: Aviva Signs, MD;  Location: [facility];  Service: General;  Laterality: Left;  . PORTACATH PLACEMENT Left 01/31/2015   Procedure: INSERTION PORT-A-CATH;  Surgeon: Aviva Signs Md, MD;  Location: [facility];  Service: General;  Laterality: Left;  . WRIST SURGERY     right       Home Medications     Prior to Admission medications   Medication Sig Start Date End Date Taking? Authorizing Provider  amitriptyline (ELAVIL) 25 MG tablet Take one tablet at bedtime for 5 days then increase to two tablets thereafter 04/15/15   Patrici Ranks, MD  cephALEXin (KEFLEX) 500 MG capsule Take 1 capsule (500 mg total) by mouth 4 (four) times daily. 11/07/16   Ezequiel Essex, MD  citalopram (CELEXA) 20 MG tablet Take 1 tablet daily x 7 days. Then increase to 2 tablets daily. 03/04/15   Patrici Ranks, MD  HYDROcodone-acetaminophen (NORCO/VICODIN) 5-325 MG tablet Take 2 tablets by mouth every 4 (four) hours as needed. 11/10/16   Fransico Meadow, PA-C  lidocaine-prilocaine (EMLA) cream Apply a quarter size amount to port site 1 hour prior to chemo. Do not rub in. Cover with plastic wrap. 05/27/15   Patrici Ranks, MD  LORazepam (ATIVAN) 0.5 MG tablet Take 1 tablet (0.5 mg total) by mouth every 8 (eight) hours as needed for anxiety. 01/24/15   Baird Cancer, PA-C  omeprazole (PRILOSEC) 40 MG capsule Take 1 capsule (40 mg total) by mouth daily. 03/04/15   Patrici Ranks, MD  promethazine (PHENERGAN) 25 MG tablet Take 1 tablet (25 mg total) by mouth every 6 (six) hours as needed. 11/04/16   [name], MD    Family History Family History  Problem Relation Age of Onset  . Seizures  Mother   . Diabetes Father     Social History Social History  Substance Use Topics  . Smoking status: Current Every Day Smoker    Packs/day: 0.50    Years: 2.00    Types: Cigarettes  . Smokeless tobacco: Never Used  . Alcohol use Yes     Comment: occasional     Allergies   Clindamycin/lincomycin   Review of Systems Review of Systems  Constitutional: Negative for fever.  HENT: Negative for facial swelling.   Gastrointestinal: Negative for nausea and vomiting.  Skin: Positive for wound (well-healed). Negative for color change.     Physical Exam Updated Vital Signs BP 98/63 (BP Location: Left Arm)    Pulse 62   Temp 98.7 F (37.1 C) (Oral)   Resp 18   Ht _0  (1.753 m)   Wt 170 lb (77.1 kg)   SpO2 99%   BMI 25.10 kg/m   Physical Exam  Constitutional: He is oriented to person, place, and time. He appears well-developed.  HENT:  Head: Normocephalic and atraumatic.  Mouth/Throat: Oropharynx is clear and moist.  Eyes: Conjunctivae and EOM are normal.  Neck: Normal range of motion. Neck supple.  Cardiovascular: Normal rate.   Pulmonary/Chest: Effort normal.  Musculoskeletal: Normal range of motion.  Neurological: He is alert and oriented to person, place, and time.  Skin: Skin is warm and dry.  3 cm well healed wound to the mid-chin. 3 sutures are absent prior to exam.   Psychiatric: He has a normal mood and affect.  Nursing note and vitals reviewed.    ED Treatments / Results  DIAGNOSTIC STUDIES: Oxygen Saturation is 99% on RA, normal by my interpretation.    COORDINATION OF CARE: 1:47 PM Discussed treatment plan with pt at bedside and pt agreed to plan, which includes suture removal.   Labs (all labs ordered are listed, but only abnormal results are displayed) Labs Reviewed - No data to display  EKG  EKG Interpretation None       Radiology No results found.  Procedures Procedures (including critical care time)  Medications Ordered in ED Medications - No data to display   Initial Impression / Assessment and Plan / ED Course  I have reviewed the triage vital signs and the nursing notes.  Pertinent labs & imaging results that were available during my care of the patient were reviewed by me and considered in my medical decision making (see chart for details).    9 sutures were removed without difficulty. Appears to be healing well. Patient was seen by a dentist earlier today, for injury to the gingiva which also appears to be healing without evidence of infection.       Final Clinical Impressions(s) / ED Diagnoses   Final diagnoses:  Visit for suture  removal    New Prescriptions New Prescriptions   No medications on file   I personally performed the services described in this documentation, which was scribed in my presence. The recorded information has been reviewed and is accurate.     Kem Parkinson, PA-C 11/18/16 2233    Milton Ferguson, MD 11/19/16 [phone]

## 2016-12-03 DIAGNOSIS — H3581 Retinal edema: Secondary | ICD-10-CM | POA: Insufficient documentation

## 2016-12-03 DIAGNOSIS — H3523 Other non-diabetic proliferative retinopathy, bilateral: Secondary | ICD-10-CM | POA: Insufficient documentation

## 2016-12-03 DIAGNOSIS — H21543 Posterior synechiae (iris), bilateral: Secondary | ICD-10-CM | POA: Insufficient documentation

## 2016-12-03 DIAGNOSIS — H2513 Age-related nuclear cataract, bilateral: Secondary | ICD-10-CM | POA: Insufficient documentation

## 2016-12-09 ENCOUNTER — Encounter (HOSPITAL_COMMUNITY): Payer: Medicaid Other | Attending: Oncology | Admitting: Oncology

## 2016-12-09 ENCOUNTER — Encounter (HOSPITAL_COMMUNITY): Payer: Medicaid Other

## 2016-12-09 ENCOUNTER — Encounter (HOSPITAL_COMMUNITY): Payer: Self-pay

## 2016-12-09 VITALS — BP 111/67 | HR 61 | Temp 98.6°F | Resp 18 | Wt 163.8 lb

## 2016-12-09 DIAGNOSIS — Z8579 Personal history of other malignant neoplasms of lymphoid, hematopoietic and related tissues: Secondary | ICD-10-CM | POA: Diagnosis present

## 2016-12-09 DIAGNOSIS — C8100 Nodular lymphocyte predominant Hodgkin lymphoma, unspecified site: Secondary | ICD-10-CM | POA: Insufficient documentation

## 2016-12-09 DIAGNOSIS — C8194 Hodgkin lymphoma, unspecified, lymph nodes of axilla and upper limb: Secondary | ICD-10-CM

## 2016-12-09 LAB — CBC WITH DIFFERENTIAL/PLATELET
Basophils Absolute: 0 10*3/uL (ref 0.0–0.1)
Basophils Relative: 0 %
EOS ABS: 0 10*3/uL (ref 0.0–0.7)
EOS PCT: 0 %
HCT: 37.5 % — ABNORMAL LOW (ref 39.0–52.0)
Hemoglobin: 12.4 g/dL — ABNORMAL LOW (ref 13.0–17.0)
LYMPHS ABS: 2.6 10*3/uL (ref 0.7–4.0)
LYMPHS PCT: 32 %
MCH: 29.1 pg (ref 26.0–34.0)
MCHC: 33.1 g/dL (ref 30.0–36.0)
MCV: 88 fL (ref 78.0–100.0)
MONO ABS: 0.5 10*3/uL (ref 0.1–1.0)
Monocytes Relative: 6 %
Neutro Abs: 5 10*3/uL (ref 1.7–7.7)
Neutrophils Relative %: 62 %
PLATELETS: 211 10*3/uL (ref 150–400)
RBC: 4.26 MIL/uL (ref 4.22–5.81)
RDW: 15.2 % (ref 11.5–15.5)
WBC: 8.2 10*3/uL (ref 4.0–10.5)

## 2016-12-09 LAB — COMPREHENSIVE METABOLIC PANEL
ALT: 21 U/L (ref 17–63)
ANION GAP: 6 (ref 5–15)
AST: 22 U/L (ref 15–41)
Albumin: 3.6 g/dL (ref 3.5–5.0)
Alkaline Phosphatase: 50 U/L (ref 38–126)
BUN: 21 mg/dL — ABNORMAL HIGH (ref 6–20)
CHLORIDE: 101 mmol/L (ref 101–111)
CO2: 29 mmol/L (ref 22–32)
Calcium: 9 mg/dL (ref 8.9–10.3)
Creatinine, Ser: 1.03 mg/dL (ref 0.61–1.24)
Glucose, Bld: 94 mg/dL (ref 65–99)
POTASSIUM: 3.7 mmol/L (ref 3.5–5.1)
SODIUM: 136 mmol/L (ref 135–145)
Total Bilirubin: 0.6 mg/dL (ref 0.3–1.2)
Total Protein: 8.8 g/dL — ABNORMAL HIGH (ref 6.5–8.1)

## 2016-12-09 LAB — LACTATE DEHYDROGENASE: LDH: 128 U/L (ref 98–192)

## 2016-12-09 NOTE — Patient Instructions (Signed)
[place] at [facility] Discharge Instructions  RECOMMENDATIONS MADE BY THE CONSULTANT AND ANY TEST RESULTS WILL BE SENT TO YOUR REFERRING PHYSICIAN.  You were seen today by Dr. Twana First Follow up in 6 months with lab work and CT scan prior See Amy up front for appointments   Thank you for choosing [facility] at [facility] to provide your oncology and hematology care.  To afford each patient quality time with our provider, please arrive at least 15 minutes before your scheduled appointment time.    If you have a lab appointment with the [facility] please come in thru the  Main Entrance and check in at the main information desk  You need to re-schedule your appointment should you arrive 10 or more minutes late.  We strive to give you quality time with our providers, and arriving late affects you and other patients whose appointments are after yours.  Also, if you no show three or more times for appointments you may be dismissed from the clinic at the providers discretion.     Again, thank you for choosing [facility].  Our hope is that these requests will decrease the amount of time that you wait before being seen by our physicians.       _____________________________________________________________  Should you have questions after your visit to [facility], please contact our office at [phone] between the hours of 8:30 a.m. and 4:30 p.m.  Voicemails left after 4:30 p.m. will not be returned until the following business day.  For prescription refill requests, have your pharmacy contact our office.       Resources For Cancer Patients and their Caregivers ? American Cancer Society: Can assist with transportation, wigs, general needs, runs Look Good Feel Better.        636-206-9517 ? Cancer Care: Provides financial assistance, online support groups, medication/co-pay assistance.  1-800-813-HOPE  570-044-6962) ? [facility] Assists [place] cancer patients and their families through emotional , educational and financial support.  418 556 1535 ? [place] DSS Where to apply for food stamps, Medicaid and utility assistance. 276-465-1482 ? [facility]: Transportation to medical appointments. 951-379-1464 ? Social Security Administration: May apply for disability if have a Stage IV cancer. 6292771709 (778)537-4966 ? [employer], Disability and Transit Services: Assists with nutrition, care and transit needs. [place] Support Programs: @10RELATIVEDAYS @ > Cancer Support Group  2nd Tuesday of the month 1pm-2pm, Journey Room  > Creative Journey  3rd Tuesday of the month 1130am-1pm, Journey Room  > Look Good Feel Better  1st Wednesday of the month 10am-12 noon, Journey Room (Call [place] to register [phone])

## 2016-12-09 NOTE — Progress Notes (Signed)
No PCP Per Patient No primary provider on file.  Progress Note  Hodgkin lymphoma of lymph nodes of axilla, unspecified Hodgkin lymphoma type ([place]) - Plan: CBC with Differential, Comprehensive metabolic panel, Lactate dehydrogenase, CT Abdomen Pelvis W Contrast, CT Chest W Contrast  CURRENT THERAPY: Surveillance per NCCN guidelines  INTERVAL HISTORY: Robert Acevedo 46 y.o. male returns for followup of Stage III Hodgkin's Lymphoma with questionable involvement of lingula on PET imaging. BMBX negative. PET with mild hypermetabolism with borderline enlarged bilateral axillary and mediastinal nodes. Bilateral inguinal nodes. Restaging PET following 2 cycles of ABVD demonstrates a complete remission with Deauville 1 noted.    Hodgkin lymphoma ([place])   01/13/2015 Initial Biopsy    L inguinal LN biopsy on 01/13/2015 with Classical Hodgkin Lymphoma, nodular sclerosing type      01/20/2015 Bone Marrow Biopsy    normal BMBX, mild polyclonal plasmacytosis      01/28/2015 Imaging    PET/CT Mild hyper metabolism is associated with the borderline enlarged bilateral axillary and mediastinal lymph nodes. There is more intense FDG uptake associated with bilateral inguinal lymph nodes. focal area of peripheral consolidation in the lingula.      01/29/2015 Imaging    MUGA with EF 69%      01/29/2015 Imaging    1.spirometry is normal .no change with bronchodilator. lung volumes are normal. airway resistance somewhat high. DLCO normal decreased MIP/MEP may indicate weakness of the bellows function of the lung.       02/04/2015 - 07/08/2015 Chemotherapy    ABVD x 6 cycles      03/28/2015 PET scan    No adenopathy or abnormal hypermetabolic activity along the previous axillary, mediastinal, and inguinal lymph node chains. Deauville 1.      03/31/2015 Imaging    MUGA- Normal LEFT ventricular ejection fraction of 70%, not significantly changed from the 69% calculated on the previous study of  01/29/2015.      05/30/2015 Imaging    MUGA- No significant change in ejection fraction equal 73%      07/29/2015 Imaging    PET/CT no specific evidence of recurrent metabolically active Hodgkin's      07/29/2015 Remission         09/10/2015 Imaging    MRI pelvis- Multiple stable lytic slightly enhancing lesions in the iliac bones and sacrum, unchanged since 12/20/2014. Even though biopsy was negative for myeloma in May 2016, I think the patient remains at increased risk for development of myeloma.      11/26/2015 Pathology Results    BMBX negative, polyclonal plasmacytosis noted      01/05/2016 Imaging    CT neck- No evidence of recurrent lymphoma in the neck.      01/05/2016 Imaging    CT CAP- Small bilateral axillary nodes and AP window mediastinal node, within normal limits and non FDG avid on prior PET. No suspicious lymphadenopathy in the chest, abdomen, or pelvis by CT size criteria. Spleen is normal in size.       08/05/2016 Imaging    Stable exam. No evidence of recurrent lymphoma or other acute findings.      Mr. [name] presents to the clinic today for continuing follow up.   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 Since our last visit he has had his port removed. He states that he doesn't have an appetite and he has lost some weight. He feels fatigued all the  time now. He notes he started a new job 2 weeks ago, which may be adding to his fatigue. He states he has occasional cold sweats when he lays down, however they are not drenching. Denies fevers, sob, abdominal pain, chest pain, and drenching night sweats.     Past Medical History:  Diagnosis Date  . Bronchitis   . Hodgkin's lymphoma ([place])   . Lymphadenopathy, inguinal    left    has Lytic bone lesion of hip; Abnormal presence of protein in urine; Hodgkin lymphoma ([place]); Uveitis; Port catheter in place; Nuclear sclerotic cataract of both eyes; Posterior synechiae  (iris), bilateral; Proliferative retinopathy, non-diabetic, bilateral; and Retinal edema on his problem list.     is allergic to clindamycin/lincomycin.  Current Outpatient Prescriptions on File Prior to Visit  Medication Sig Dispense Refill  . LORazepam (ATIVAN) 0.5 MG tablet Take 1 tablet (0.5 mg total) by mouth every 8 (eight) hours as needed for anxiety. 30 tablet 1   No current facility-administered medications on file prior to visit.     Past Surgical History:  Procedure Laterality Date  . BONE MARROW ASPIRATION Left 01/20/15  . BONE MARROW BIOPSY Left 01/20/15  . INCISION AND DRAINAGE PERITONSILLAR ABSCESS    . KNEE SURGERY    . LYMPH NODE BIOPSY Left 01/13/2015   Procedure: LEFT INGUINAL LYMPH NODE BIOPSY;  Surgeon: Aviva Signs Md, MD;  Location: [facility];  Service: General;  Laterality: Left;  . PORT-A-CATH REMOVAL Left 09/08/2016   Procedure: MINOR REMOVAL PORT-A-CATH;  Surgeon: Aviva Signs, MD;  Location: [facility];  Service: General;  Laterality: Left;  . PORTACATH PLACEMENT Left 01/31/2015   Procedure: INSERTION PORT-A-CATH;  Surgeon: Aviva Signs Md, MD;  Location: [facility];  Service: General;  Laterality: Left;  . WRIST SURGERY     right     Review of Systems  Constitutional: Positive for diaphoresis (intermittent cold sweats when he lays down ), malaise/fatigue and weight loss. Negative for fever.       Loss of appetite Denies drenching night sweats  HENT: Negative.   Eyes: Negative.   Respiratory: Negative.  Negative for sputum production.   Cardiovascular: Negative for chest pain.  Gastrointestinal: Negative.  Negative for abdominal pain.  Genitourinary: Negative.   Musculoskeletal: Negative.   Skin: Negative.   Neurological: Negative.   Endo/Heme/Allergies: Negative.   Psychiatric/Behavioral: Negative.   All other systems reviewed and are negative.    PHYSICAL EXAMINATION  ECOG PERFORMANCE STATUS: 0 - Asymptomatic  Vitals:   12/09/16 1037  BP: 111/67    Pulse: 61  Resp: 18  Temp: 98.6 F (37 C)   Filed Weights   12/09/16 1037  Weight: 163 lb 12.8 oz (74.3 kg)   Physical Exam  Constitutional: He is oriented to person, place, and time and well-developed, well-nourished, and in no distress.  HENT:  Head: Normocephalic and atraumatic.  Eyes: Conjunctivae and EOM are normal. Pupils are equal, round, and reactive to light.  Neck: Normal range of motion. Neck supple.  Cardiovascular: Normal rate, regular rhythm and normal heart sounds.   Pulmonary/Chest: Effort normal and breath sounds normal.  Abdominal: Soft. Bowel sounds are normal.  Musculoskeletal: Normal range of motion.  Neurological: He is alert and oriented to person, place, and time. Gait normal.  Skin: Skin is warm and dry.  Nursing note and vitals reviewed.     LABORATORY DATA: CBC    Component Value Date/Time   WBC 8.2 12/09/2016 1014   RBC 4.26 12/09/2016  1014   HGB 12.4 (L) 12/09/2016 1014   HCT 37.5 (L) 12/09/2016 1014   PLT 211 12/09/2016 1014   MCV 88.0 12/09/2016 1014   MCH 29.1 12/09/2016 1014   MCHC 33.1 12/09/2016 1014   RDW 15.2 12/09/2016 1014   LYMPHSABS 2.6 12/09/2016 1014   MONOABS 0.5 12/09/2016 1014   EOSABS 0.0 12/09/2016 1014   BASOSABS 0.0 12/09/2016 1014      Chemistry      Component Value Date/Time   NA 136 12/09/2016 1014   K 3.7 12/09/2016 1014   CL 101 12/09/2016 1014   CO2 29 12/09/2016 1014   BUN 21 (H) 12/09/2016 1014   CREATININE 1.03 12/09/2016 1014      Component Value Date/Time   CALCIUM 9.0 12/09/2016 1014   ALKPHOS 50 12/09/2016 1014   AST 22 12/09/2016 1014   ALT 21 12/09/2016 1014   BILITOT 0.6 12/09/2016 1014        PENDING LABS:   RADIOGRAPHIC STUDIES: I have personally reviewed the radiological images as listed and agreed with the findings in the report. No results found.  CT HEAD AND MAXILLOFACIAL WITHOUT CONTRAST  11/07/2016  IMPRESSION: 1. Normal brain 2. Negative for acute maxillofacial  fracture.  Mandible is intact.   ASSESSMENT: 46 year old male with stage III Hodgkin's Lymphoma s/p 6 cyles of ABVD completed in 07/2015 with a complete remission.  PLAN: Clinically NED. Continued surveillance observation.   Repeat 24 month follow up CT C/A/P prior to his next visit.   Labs ordered today for next visit : CBC, CMP, LDH.   RTC in 6 months for follow up and to review CT scans and labs.  THERAPY PLAN:  NCCN guidelines for surveillance of Hodgkin's Lymphoma (Age >/= 65) (1.2017):  A. Complete remission should be documented including reversion of PET to "negative" within 3 months following completion of therapy.  B. It is recommended that the patient be provided with a treatment summary at the completion of his/her therapy, including details of radiation therapy, organs at risk, and cumulative anthracycline dosage given.  C. Follow-up with an oncologist is recommended, especially during the first 5 years after treatment to detect recurrence, and then annually due to the risk of late complications including second cancers and cardiovascular disease. Late relapse or transformation to large cell lymphoma may occur in [place].  D. Frequency and types of tests may vary depending on clinical circumstances: age and stage at diagnosis, social habits, treatment modality, etc. There are few data to support specific recommendations; these represent the range of practice at [employer] [employer].   1. Follow-up after completion of treatment up to 5 years:   A. Interim H and P: Every 3-6 months for 1- 2 years, then every 6-12 months until year 3, then annually.   B. Annual influenza vaccine.   C. Laboratory studies:    1. CBC, platelets, ESR (if elevated at time of initial diagnosis), chemistry profile as clinically indicated.    2. Thyroid stimulating hormone (TSH) at least annually if radiation therapy to neck.   D. Acceptable to obtain a neck/chest/abdomen/pelvis CT scan with contrast,  at 6, 12, and 24 month following completion of therapy, or as clinically indicated. PET/CT only if last PET was Deauville 4-5, to confirm complete response.   E. Counseling: Reproduction, health habits, psychosocial, cardiovascular, breast self-exam, skin cancer risk, end of treatment discussion.   F. Surveillance PET should not be done routinely due to risk of false positives. Management  decision should not be based on PET scan alone; clinical or pathologic correlation is needed.   2. Follow-up and monitoring after 5 years:   A. Interim H&P: Annually    1. Annual blood pressure, aggressive management of cardiovascular risk factors.    2. Pneumococcal, meningococcal, and H-flu revaccination after 5-7 years, if patient treated with splenic radiation therapy or previous splenectomy (according to CBC recommendations).    3. Annual influenza vaccine.   B. Cardiovascular symptoms may emerge at a young age.    1. Consider stress test/echocardiogram at 10 year intervals after treatment is completed.    2. Consider carotid ultrasound at 10 year intervals if neck irradiation.   C. Laboratory studies:    1. CBC, platelets, chemistry profile annually    2. TSH at least annually if radiation therapy to neck    3. Biannual lipids    4. Annual fasting glucose   D. Annual breast screening: Initiate 8-10 years post therapy, or at age 69, whichever comes first, if chest or axillary radiation. The NCCN Hodgkin Lymphoma Guidelines Panel recommends breast MRI in addition to mammography for women who've received irradiation to the chest between ages 2-30 years, which is consistent with the [place] (ACS) Guidelines. Consider referral to a breast specialist.   E. Perform other routine surveillance tests for cervical, colorectal, endometrial, lung, and prostate cancer as per the ACS Cancer Screening Guidelines.   F. Counseling: Reproduction, health habits, psychosocial, cardiovascular, breast self-exam,  and skin cancer risk.   G. Treatment summary and consideration of transfer to primary care provider.   H. Consider a referral to a survivorship clinic.       All questions were answered. The patient knows to call the clinic with any problems, questions or concerns. We can certainly see the patient much sooner if necessary.  This document serves as a record of services personally performed by Twana First, MD. It was created on her behalf by Shirlean Mylar, a trained medical scribe. The creation of this record is based on the scribe's personal observations and the provider's statements to them. This document has been checked and approved by the attending provider.  I have reviewed the above documentation for accuracy and completeness and I agree with the above.  This note is electronically signed by: Mikey College 12/09/2016 10:45 AM

## 2017-03-16 ENCOUNTER — Telehealth (HOSPITAL_COMMUNITY): Payer: Self-pay

## 2017-03-16 NOTE — Telephone Encounter (Signed)
Patient called stating that he has been in remission from his Hodgkin's lymphoma for a year or 2 but he recently found some lymph nodes in his groin that are sore and he wanted to know if he should come see the doctor sooner than his scheduled appointment in October. Reviewed with Dr. Talbert Cage. She ordered the CT scan be moved up to this month. Once she gets the results of the scan she will decide if he needs to come in sooner. Patient verbalized understanding of the above. Gave him his new appointments also.

## 2017-03-18 ENCOUNTER — Other Ambulatory Visit (HOSPITAL_COMMUNITY): Payer: Medicaid Other

## 2017-03-22 ENCOUNTER — Emergency Department (HOSPITAL_COMMUNITY)
Admission: EM | Admit: 2017-03-22 | Discharge: 2017-03-22 | Disposition: A | Discharge status: Home / Self Care | Payer: Medicaid Other | Attending: Emergency Medicine | Admitting: Emergency Medicine

## 2017-03-22 ENCOUNTER — Encounter (HOSPITAL_COMMUNITY): Payer: Self-pay

## 2017-03-22 DIAGNOSIS — R112 Nausea with vomiting, unspecified: Secondary | ICD-10-CM | POA: Insufficient documentation

## 2017-03-22 DIAGNOSIS — F17210 Nicotine dependence, cigarettes, uncomplicated: Secondary | ICD-10-CM | POA: Insufficient documentation

## 2017-03-22 MED ORDER — ONDANSETRON 8 MG PO TBDP
8.0000 mg | ORAL_TABLET | Freq: Once | ORAL | Status: AC
  Administered 2017-03-22: 8 mg via ORAL
  Filled 2017-03-22: qty 1

## 2017-03-22 MED ORDER — ONDANSETRON HCL 8 MG PO TABS: 8.0000 mg | ORAL_TABLET | Freq: Three times a day (TID) | ORAL | 0 refills | Status: DC | PRN

## 2017-03-22 NOTE — ED Triage Notes (Signed)
Pt c/o abd pain and vomiting since yesterday.  Denies diarrhea.  LBM was this morning but was very small.

## 2017-03-22 NOTE — Discharge Instructions (Signed)
Start with clear liquids, then gradually advance your diet, to regular foods over the next 2 or 3 days.  For pain, take Tylenol.  Return here, if needed, for problems.

## 2017-03-22 NOTE — ED Notes (Signed)
In to dc with  papers, pt has already left. edp aware.

## 2017-03-22 NOTE — ED Provider Notes (Signed)
[facility] DEPT Provider Note   CSN: [id number] Arrival date & time: 03/22/17  0715     History   Chief Complaint Chief Complaint  Patient presents with  . Abdominal Pain    HPI Robert Acevedo is a 46 y.o. male.  Patient presents for evaluation of upper abdominal pain, which started yesterday, and was followed by multiple episodes of vomiting, containing food and mucus.  No diarrhea.  Single  normal stool today.  No rectal bleeding. No known sick exposure, fever, shortness of breath, chest pain, weakness or dizziness.  No prior similar problems.  He states that he drinks alcohol socially on the weekends.  He does not smoke.  He is a Dealer.  There are no other known modifying factors.  HPI  Past Medical History:  Diagnosis Date  . Bronchitis   . Hodgkin's lymphoma ([place])   . Lymphadenopathy, inguinal    left    Patient Active Problem List   Diagnosis Date Noted  . Nuclear sclerotic cataract of both eyes 12/03/2016  . 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 Proliferative retinopathy, non-diabetic, bilateral 12/03/2016  . Retinal edema 12/03/2016  . Port catheter in place 02/05/2016  . Uveitis 08/15/2015  . Hodgkin lymphoma ([place]) 01/23/2015  . Lytic bone lesion of hip   . Abnormal presence of protein in urine     Past Surgical History:  Procedure Laterality Date  . BONE MARROW ASPIRATION Left 01/20/15  . BONE MARROW BIOPSY Left 01/20/15  . INCISION AND DRAINAGE PERITONSILLAR ABSCESS    . KNEE SURGERY    . LYMPH NODE BIOPSY Left 01/13/2015   Procedure: LEFT INGUINAL LYMPH NODE BIOPSY;  Surgeon: Aviva Signs Md, MD;  Location: [facility];  Service: General;  Laterality: Left;  . PORT-A-CATH REMOVAL Left 09/08/2016   Procedure: MINOR REMOVAL PORT-A-CATH;  Surgeon: Aviva Signs, MD;  Location: [facility];  Service: General;  Laterality: Left;  . PORTACATH PLACEMENT Left 01/31/2015   Procedure: INSERTION PORT-A-CATH;  Surgeon: Aviva Signs Md, MD;  Location: [facility];  Service: General;  Laterality: Left;  . WRIST SURGERY     right       Home Medications    Prior to Admission medications   Medication Sig Start Date End Date Taking? Authorizing Provider  LORazepam (ATIVAN) 0.5 MG tablet Take 1 tablet (0.5 mg total) by mouth every 8 (eight) hours as needed for anxiety. 01/24/15   Baird Cancer, PA-C  ondansetron (ZOFRAN) 8 MG tablet Take 1 tablet (8 mg total) by mouth every 8 (eight) hours as needed for nausea or vomiting. 03/22/17   Daleen Bo, MD  predniSONE (DELTASONE) 10 MG tablet Take 60 mg by mouth. 12/03/16   [provider]    Family History Family History  Problem Relation Age of Onset  . Seizures Mother   . Diabetes Father     Social History Social History  Substance Use Topics  . Smoking status: Current Every Day Smoker    Packs/day: 0.50    Years: 2.00    Types: Cigarettes  . Smokeless tobacco: Never Used  . Alcohol use Yes     Comment: occasional     Allergies   Clindamycin/lincomycin   Review of Systems Review of Systems  All other systems reviewed and are negative.    Physical Exam Updated Vital Signs BP 126/79 (BP Location: Right Arm)   Pulse (!) 47   Temp 98.1 F (36.7 C) (Oral)   Resp 18   Ht 5'  9" (1.753 m)   Wt 78.5 kg (173 lb)   SpO2 100%   BMI 25.55 kg/m   Physical Exam  Constitutional: He is oriented to person, place, and time. He appears well-developed and well-nourished. No distress.  HENT:  Head: Normocephalic and atraumatic.  Right Ear: External ear normal.  Left Ear: External ear normal.  Eyes: Pupils are equal, round, and reactive to light. Conjunctivae and EOM are normal.  Neck: Normal range of motion and phonation normal. Neck supple.  Cardiovascular: Normal rate, regular rhythm and normal heart sounds.   Pulmonary/Chest: Effort normal and breath sounds normal. He exhibits no bony tenderness.  Abdominal: Soft. He exhibits no mass. There is tenderness (Diffuse,  mild). There is no rebound and no guarding.  Hypoactive bowel sounds  Musculoskeletal: Normal range of motion.  Neurological: He is alert and oriented to person, place, and time. No cranial nerve deficit or sensory deficit. He exhibits normal muscle tone. Coordination normal.  Skin: Skin is warm, dry and intact.  Psychiatric: He has a normal mood and affect. His behavior is normal. Judgment and thought content normal.  Nursing note and vitals reviewed.    ED Treatments / Results  Labs (all labs ordered are listed, but only abnormal results are displayed) Labs Reviewed - No data to display  EKG  EKG Interpretation None       Radiology No results found.  Procedures Procedures (including critical care time)  Medications Ordered in ED Medications  ondansetron (ZOFRAN-ODT) disintegrating tablet 8 mg (8 mg Oral Given 03/22/17 0820)     Initial Impression / Assessment and Plan / ED Course  I have reviewed the triage vital signs and the nursing notes.  Pertinent labs & imaging results that were available during my care of the patient were reviewed by me and considered in my medical decision making (see chart for details).      Patient Vitals for the past 24 hrs:  BP Temp Temp src Pulse Resp SpO2 Height Weight  03/22/17 0729 - - - - - - _0  (1.753 m) 78.5 kg (173 lb)  03/22/17 0728 126/79 98.1 F (36.7 C) Oral (!) 47 18 100 % - -    9:41 AM Reevaluation with update and discussion. After initial assessment and treatment, an updated evaluation reveals he is tolerating oral liquids, and feels better.  Findings discussed with patient and questions answered. [name]    Final Clinical Impressions(s) / ED Diagnoses   Final diagnoses:  Non-intractable vomiting with nausea, unspecified vomiting type    Nonspecific vomiting, without diarrhea.  Differential diagnosis includes food poisoning, and nonspecific enteritis.  Doubt serious bacterial infection or metabolic  instability.  Doubt pancreatitis.  Nursing Notes Reviewed/ Care Coordinated Applicable Imaging Reviewed Interpretation of Laboratory Data incorporated into ED treatment  The patient appears reasonably screened and/or stabilized for discharge and I doubt any other medical condition or other [facility] requiring further screening, evaluation, or treatment in the ED at this time prior to discharge.  Plan: Home Medications-OTC, as needed; Home Treatments-gradually advance diet; return here if the recommended treatment, does not improve the symptoms; Recommended follow up-PCP, as needed   New Prescriptions New Prescriptions   ONDANSETRON (ZOFRAN) 8 MG TABLET    Take 1 tablet (8 mg total) by mouth every 8 (eight) hours as needed for nausea or vomiting.     Daleen Bo, MD 03/22/17 [phone]

## 2017-03-24 ENCOUNTER — Ambulatory Visit (HOSPITAL_COMMUNITY)
Admission: RE | Admit: 2017-03-24 | Discharge: 2017-03-24 | Disposition: A | Discharge status: Home / Self Care | Payer: Medicaid Other | Source: Ambulatory Visit | Attending: Oncology | Admitting: Oncology

## 2017-03-24 DIAGNOSIS — C8194 Hodgkin lymphoma, unspecified, lymph nodes of axilla and upper limb: Secondary | ICD-10-CM | POA: Insufficient documentation

## 2017-03-24 DIAGNOSIS — R911 Solitary pulmonary nodule: Secondary | ICD-10-CM | POA: Diagnosis not present

## 2017-03-24 DIAGNOSIS — R918 Other nonspecific abnormal finding of lung field: Secondary | ICD-10-CM | POA: Diagnosis not present

## 2017-03-24 MED ORDER — IOPAMIDOL (ISOVUE-300) INJECTION 61%
100.0000 mL | Freq: Once | INTRAVENOUS | Status: AC | PRN
  Administered 2017-03-24: 100 mL via INTRAVENOUS

## 2017-03-24 MED ORDER — IOPAMIDOL (ISOVUE-300) INJECTION 61%
INTRAVENOUS | Status: AC
  Administered 2017-03-24: 100 mL
  Filled 2017-03-24: qty 30

## 2017-04-15 ENCOUNTER — Encounter (HOSPITAL_COMMUNITY): Payer: Self-pay | Admitting: Emergency Medicine

## 2017-04-15 ENCOUNTER — Emergency Department (HOSPITAL_COMMUNITY)
Admission: EM | Admit: 2017-04-15 | Discharge: 2017-04-15 | Disposition: A | Discharge status: Home / Self Care | Payer: Medicaid Other | Attending: Emergency Medicine | Admitting: Emergency Medicine

## 2017-04-15 ENCOUNTER — Telehealth (HOSPITAL_COMMUNITY): Payer: Self-pay

## 2017-04-15 ENCOUNTER — Emergency Department (HOSPITAL_COMMUNITY): Payer: Medicaid Other

## 2017-04-15 DIAGNOSIS — C8190 Hodgkin lymphoma, unspecified, unspecified site: Secondary | ICD-10-CM | POA: Insufficient documentation

## 2017-04-15 DIAGNOSIS — R112 Nausea with vomiting, unspecified: Secondary | ICD-10-CM | POA: Diagnosis not present

## 2017-04-15 DIAGNOSIS — R197 Diarrhea, unspecified: Secondary | ICD-10-CM | POA: Diagnosis not present

## 2017-04-15 DIAGNOSIS — Z79899 Other long term (current) drug therapy: Secondary | ICD-10-CM | POA: Diagnosis not present

## 2017-04-15 DIAGNOSIS — R109 Unspecified abdominal pain: Secondary | ICD-10-CM | POA: Insufficient documentation

## 2017-04-15 DIAGNOSIS — F17210 Nicotine dependence, cigarettes, uncomplicated: Secondary | ICD-10-CM | POA: Insufficient documentation

## 2017-04-15 LAB — URINALYSIS, ROUTINE W REFLEX MICROSCOPIC
Bacteria, UA: NONE SEEN
Bilirubin Urine: NEGATIVE
GLUCOSE, UA: NEGATIVE mg/dL
Hgb urine dipstick: NEGATIVE
Ketones, ur: NEGATIVE mg/dL
Nitrite: NEGATIVE
PH: 6 (ref 5.0–8.0)
Protein, ur: NEGATIVE mg/dL
SPECIFIC GRAVITY, URINE: 1.023 (ref 1.005–1.030)

## 2017-04-15 LAB — COMPREHENSIVE METABOLIC PANEL
ALBUMIN: 3.7 g/dL (ref 3.5–5.0)
ALT: 15 U/L — ABNORMAL LOW (ref 17–63)
ANION GAP: 7 (ref 5–15)
AST: 21 U/L (ref 15–41)
Alkaline Phosphatase: 54 U/L (ref 38–126)
BUN: 15 mg/dL (ref 6–20)
CALCIUM: 9.5 mg/dL (ref 8.9–10.3)
CO2: 31 mmol/L (ref 22–32)
CREATININE: 1.2 mg/dL (ref 0.61–1.24)
Chloride: 101 mmol/L (ref 101–111)
GFR calc Af Amer: >60 mL/min (ref >60)
GFR calc non Af Amer: >60 mL/min (ref >60)
Glucose, Bld: 90 mg/dL (ref 65–99)
Potassium: 3.8 mmol/L (ref 3.5–5.1)
SODIUM: 139 mmol/L (ref 135–145)
TOTAL PROTEIN: 9.6 g/dL — AB (ref 6.5–8.1)
Total Bilirubin: 0.6 mg/dL (ref 0.3–1.2)

## 2017-04-15 LAB — DIFFERENTIAL
BASOS ABS: 0 10*3/uL (ref 0.0–0.1)
BASOS PCT: 0 %
Eosinophils Absolute: 0.1 10*3/uL (ref 0.0–0.7)
Eosinophils Relative: 1 %
LYMPHS ABS: 2.3 10*3/uL (ref 0.7–4.0)
LYMPHS PCT: 31 %
MONOS PCT: 12 %
Monocytes Absolute: 0.9 10*3/uL (ref 0.1–1.0)
NEUTROS ABS: 4.2 10*3/uL (ref 1.7–7.7)
Neutrophils Relative %: 56 %

## 2017-04-15 LAB — CBC
HCT: 42.8 % (ref 39.0–52.0)
Hemoglobin: 14.1 g/dL (ref 13.0–17.0)
MCH: 29.2 pg (ref 26.0–34.0)
MCHC: 32.9 g/dL (ref 30.0–36.0)
MCV: 88.6 fL (ref 78.0–100.0)
PLATELETS: 214 10*3/uL (ref 150–400)
RBC: 4.83 MIL/uL (ref 4.22–5.81)
RDW: 13.6 % (ref 11.5–15.5)
WBC: 7.5 10*3/uL (ref 4.0–10.5)

## 2017-04-15 LAB — LIPASE, BLOOD: Lipase: 23 U/L (ref 11–51)

## 2017-04-15 MED ORDER — DICYCLOMINE HCL 10 MG/ML IM SOLN
20.0000 mg | Freq: Once | INTRAMUSCULAR | Status: AC
  Administered 2017-04-15: 20 mg via INTRAMUSCULAR
  Filled 2017-04-15: qty 2

## 2017-04-15 MED ORDER — MORPHINE SULFATE (PF) 4 MG/ML IV SOLN
4.0000 mg | INTRAVENOUS | Status: DC | PRN
  Administered 2017-04-15: 4 mg via INTRAVENOUS
  Filled 2017-04-15: qty 1

## 2017-04-15 MED ORDER — IOPAMIDOL (ISOVUE-300) INJECTION 61%
100.0000 mL | Freq: Once | INTRAVENOUS | Status: AC | PRN
  Administered 2017-04-15: 100 mL via INTRAVENOUS

## 2017-04-15 MED ORDER — FAMOTIDINE IN NACL 20-0.9 MG/50ML-% IV SOLN
20.0000 mg | Freq: Once | INTRAVENOUS | Status: AC
  Administered 2017-04-15: 20 mg via INTRAVENOUS
  Filled 2017-04-15: qty 50

## 2017-04-15 MED ORDER — ONDANSETRON HCL 4 MG/2ML IJ SOLN
4.0000 mg | INTRAMUSCULAR | Status: DC | PRN
  Administered 2017-04-15: 4 mg via INTRAVENOUS
  Filled 2017-04-15: qty 2

## 2017-04-15 MED ORDER — ONDANSETRON 4 MG PO TBDP: 4.0000 mg | ORAL_TABLET | Freq: Three times a day (TID) | ORAL | 0 refills | Status: DC | PRN

## 2017-04-15 MED ORDER — SODIUM CHLORIDE 0.9 % IV BOLUS (SEPSIS)
1000.0000 mL | Freq: Once | INTRAVENOUS | Status: AC
  Administered 2017-04-15: 1000 mL via INTRAVENOUS

## 2017-04-15 MED ORDER — HYDROCODONE-ACETAMINOPHEN 5-325 MG PO TABS: ORAL_TABLET | ORAL | 0 refills | Status: DC

## 2017-04-15 MED ORDER — FAMOTIDINE 20 MG PO TABS: 20.0000 mg | ORAL_TABLET | Freq: Two times a day (BID) | ORAL | 0 refills | Status: DC

## 2017-04-15 NOTE — ED Triage Notes (Signed)
Pt reports generalized abd pain, emesis and nausea since Tuesday. Pt reports history of same. Pt denies any known fever.

## 2017-04-15 NOTE — ED Notes (Signed)
Pt tolerated sprite and pain is better since morphine, EDP made aware

## 2017-04-15 NOTE — ED Notes (Signed)
Pt returned from CT °

## 2017-04-15 NOTE — ED Provider Notes (Signed)
[facility] DEPT Provider Note   CSN: [id number] Arrival date & time: 04/15/17  1427     History   Chief Complaint Chief Complaint  Patient presents with  . Abdominal Pain    HPI Deshay K Merkey is a 46 y.o. male.  HPI  Pt was seen at 1510.  Per pt, c/o gradual onset and persistence of constant left abd "pain" for the past 3 days.  Has been associated with multiple intermittent episodes of N/V and a few "loose" stools.  Describes the abd pain as "cramping."  Pt states he has had similar symptoms previously.  Denies fevers, no back pain, no rash, no CP/SOB, no black or blood in stools or emesis, no dysuria/hematuria, no testicular pain/swelling.      Past Medical History:  Diagnosis Date  . Bronchitis   . Hodgkin's lymphoma ([place])   . Lymphadenopathy, inguinal    left    Patient Active Problem List   Diagnosis Date Noted  . Nuclear sclerotic cataract of both eyes 12/03/2016  . 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 Proliferative retinopathy, non-diabetic, bilateral 12/03/2016  . Retinal edema 12/03/2016  . Port catheter in place 02/05/2016  . Uveitis 08/15/2015  . Hodgkin lymphoma ([place]) 01/23/2015  . Lytic bone lesion of hip   . Abnormal presence of protein in urine     Past Surgical History:  Procedure Laterality Date  . BONE MARROW ASPIRATION Left 01/20/15  . BONE MARROW BIOPSY Left 01/20/15  . INCISION AND DRAINAGE PERITONSILLAR ABSCESS    . KNEE SURGERY    . LYMPH NODE BIOPSY Left 01/13/2015   Procedure: LEFT INGUINAL LYMPH NODE BIOPSY;  Surgeon: Aviva Signs Md, MD;  Location: [facility];  Service: General;  Laterality: Left;  . PORT-A-CATH REMOVAL Left 09/08/2016   Procedure: MINOR REMOVAL PORT-A-CATH;  Surgeon: Aviva Signs, MD;  Location: [facility];  Service: General;  Laterality: Left;  . PORTACATH PLACEMENT Left 01/31/2015   Procedure: INSERTION PORT-A-CATH;  Surgeon: Aviva Signs Md, MD;  Location: [facility];  Service: General;  Laterality: Left;  .  WRIST SURGERY     right       Home Medications    Prior to Admission medications   Medication Sig Start Date End Date Taking? Authorizing Provider  LORazepam (ATIVAN) 0.5 MG tablet Take 1 tablet (0.5 mg total) by mouth every 8 (eight) hours as needed for anxiety. Patient not taking: Reported on 04/15/2017 01/24/15   Baird Cancer, PA-C  ondansetron (ZOFRAN) 8 MG tablet Take 1 tablet (8 mg total) by mouth every 8 (eight) hours as needed for nausea or vomiting. Patient not taking: Reported on 04/15/2017 03/22/17   Daleen Bo, MD  predniSONE (DELTASONE) 10 MG tablet Take 60 mg by mouth. 12/03/16   [provider]    Family History Family History  Problem Relation Age of Onset  . Seizures Mother   . Diabetes Father     Social History Social History  Substance Use Topics  . Smoking status: Current Every Day Smoker    Packs/day: 0.50    Years: 2.00    Types: Cigarettes  . Smokeless tobacco: Never Used  . Alcohol use Yes     Comment: occasional     Allergies   Clindamycin/lincomycin   Review of Systems Review of Systems ROS: Statement: All systems negative except as marked or noted in the HPI; Constitutional: Negative for fever and chills. ; ; Eyes: Negative for eye pain, redness and discharge. ; ; ENMT: Negative  for ear pain, hoarseness, nasal congestion, sinus pressure and sore throat. ; ; Cardiovascular: Negative for chest pain, palpitations, diaphoresis, dyspnea and peripheral edema. ; ; Respiratory: Negative for cough, wheezing and stridor. ; ; Gastrointestinal: +N/V, loose stools, abd pain. Negative for blood in stool, hematemesis, jaundice and rectal bleeding. . ; ; Genitourinary: Negative for dysuria, flank pain and hematuria. ; ; Genital:  No penile drainage or rash, no testicular pain or swelling, no scrotal rash or swelling. ;; Musculoskeletal: Negative for back pain and neck pain. Negative for swelling and trauma.; ; Skin: Negative for pruritus, rash,  abrasions, blisters, bruising and skin lesion.; ; Neuro: Negative for headache, lightheadedness and neck stiffness. Negative for weakness, altered level of consciousness, altered mental status, extremity weakness, paresthesias, involuntary movement, seizure and syncope.       Physical Exam Updated Vital Signs BP (!) 109/59 (BP Location: Right Arm)   Pulse 82   Temp 99.1 F (37.3 C) (Oral)   Resp 18   Ht _0  (1.753 m)   Wt 78.5 kg (173 lb)   SpO2 99%   BMI 25.55 kg/m   Physical Exam 1515: Physical examination:  Nursing notes reviewed; Vital signs and O2 SAT reviewed;  Constitutional: Well developed, Well nourished, Well hydrated, Uncomfortable appearing.; Head:  Normocephalic, atraumatic; Eyes: EOMI, PERRL, No scleral icterus; ENMT: Mouth and pharynx normal, Mucous membranes moist; Neck: Supple, Full range of motion, No lymphadenopathy; Cardiovascular: Regular rate and rhythm, No gallop; Respiratory: Breath sounds clear & equal bilaterally, No wheezes.  Speaking full sentences with ease, Normal respiratory effort/excursion; Chest: Nontender, Movement normal; Abdomen: Soft, +LUQ and LLQ tender to palp. No rebound or guarding. Nondistended, Normal bowel sounds; Genitourinary: No CVA tenderness; Extremities: Pulses normal, No tenderness, No edema, No calf edema or asymmetry.; Neuro: AA&Ox3, Major CN grossly intact.  Speech clear. No gross focal motor or sensory deficits in extremities.; Skin: Color normal, Warm, Dry.   ED Treatments / Results  Labs (all labs ordered are listed, but only abnormal results are displayed)   EKG  EKG Interpretation None       Radiology   Procedures Procedures (including critical care time)  Medications Ordered in ED Medications  famotidine (PEPCID) IVPB 20 mg premix (20 mg Intravenous New Bag/Given 04/15/17 1533)  ondansetron (ZOFRAN) injection 4 mg (4 mg Intravenous Given 04/15/17 1533)  sodium chloride 0.9 % bolus 1,000 mL (1,000 mLs Intravenous  New Bag/Given 04/15/17 1533)  dicyclomine (BENTYL) injection 20 mg (20 mg Intramuscular Given 04/15/17 1533)     Initial Impression / Assessment and Plan / ED Course  I have reviewed the triage vital signs and the nursing notes.  Pertinent labs & imaging results that were available during my care of the patient were reviewed by me and considered in my medical decision making (see chart for details).  MDM Reviewed: previous chart, nursing note and vitals Reviewed previous: labs and CT scan Interpretation: CT scan and labs   Results for orders placed or performed during the hospital encounter of 04/15/17  Comprehensive metabolic panel  Result Value Ref Range   Sodium 139 135 - 145 mmol/L   Potassium 3.8 3.5 - 5.1 mmol/L   Chloride 101 101 - 111 mmol/L   CO2 31 22 - 32 mmol/L   Glucose, Bld 90 65 - 99 mg/dL   BUN 15 6 - 20 mg/dL   Creatinine, Ser 1.20 0.61 - 1.24 mg/dL   Calcium 9.5 8.9 - 10.3 mg/dL   Total Protein 9.6 (H)  6.5 - 8.1 g/dL   Albumin 3.7 3.5 - 5.0 g/dL   AST 21 15 - 41 U/L   ALT 15 (L) 17 - 63 U/L   Alkaline Phosphatase 54 38 - 126 U/L   Total Bilirubin 0.6 0.3 - 1.2 mg/dL   GFR calc non Af Amer >60 >60 mL/min   GFR calc Af Amer >60 >60 mL/min   Anion gap 7 5 - 15  CBC  Result Value Ref Range   WBC 7.5 4.0 - 10.5 K/uL   RBC 4.83 4.22 - 5.81 MIL/uL   Hemoglobin 14.1 13.0 - 17.0 g/dL   HCT 42.8 39.0 - 52.0 %   MCV 88.6 78.0 - 100.0 fL   MCH 29.2 26.0 - 34.0 pg   MCHC 32.9 30.0 - 36.0 g/dL   RDW 13.6 11.5 - 15.5 %   Platelets 214 150 - 400 K/uL  Urinalysis, Routine w reflex microscopic  Result Value Ref Range   Color, Urine YELLOW YELLOW   APPearance CLEAR CLEAR   Specific Gravity, Urine 1.023 1.005 - 1.030   pH 6.0 5.0 - 8.0   Glucose, UA NEGATIVE NEGATIVE mg/dL   Hgb urine dipstick NEGATIVE NEGATIVE   Bilirubin Urine NEGATIVE NEGATIVE   Ketones, ur NEGATIVE NEGATIVE mg/dL   Protein, ur NEGATIVE NEGATIVE mg/dL   Nitrite NEGATIVE NEGATIVE   Leukocytes,  UA SMALL (A) NEGATIVE   RBC / HPF 0-5 0 - 5 RBC/hpf   WBC, UA TOO NUMEROUS TO COUNT 0 - 5 WBC/hpf   Bacteria, UA NONE SEEN NONE SEEN   Squamous Epithelial / LPF 0-5 (A) NONE SEEN   Mucous PRESENT   Differential  Result Value Ref Range   Neutrophils Relative % 56 %   Neutro Abs 4.2 1.7 - 7.7 K/uL   Lymphocytes Relative 31 %   Lymphs Abs 2.3 0.7 - 4.0 K/uL   Monocytes Relative 12 %   Monocytes Absolute 0.9 0.1 - 1.0 K/uL   Eosinophils Relative 1 %   Eosinophils Absolute 0.1 0.0 - 0.7 K/uL   Basophils Relative 0 %   Basophils Absolute 0.0 0.0 - 0.1 K/uL  Lipase, blood  Result Value Ref Range   Lipase 23 11 - 51 U/L    Ct Abdomen Pelvis W Contrast Result Date: 04/15/2017 CLINICAL DATA:  Generalized abdominal pain with emesis and nausea, history of lymphoma EXAM: CT ABDOMEN AND PELVIS WITH CONTRAST TECHNIQUE: Multidetector CT imaging of the abdomen and pelvis was performed using the standard protocol following bolus administration of intravenous contrast. CONTRAST:  158m ISOVUE-300 IOPAMIDOL (ISOVUE-300) INJECTION 61% COMPARISON:  03/24/2017, 11/04/2016, 08/05/2016, PET-CT 07/29/2015 FINDINGS: Lower chest: Lung bases demonstrate no acute consolidation or effusion. Normal heart size. Hepatobiliary: 8 x 11 mm small hypodense focus within the left lobe of the liver, not clearly visible on prior exams. No calcified gallstones. No biliary dilatation Pancreas: Unremarkable. No pancreatic ductal dilatation or surrounding inflammatory changes. Spleen: Slightly heterogeneous but no focal abnormality Adrenals/Urinary Tract: Adrenal glands are within normal limits. Negative for hydronephrosis. Bladder within normal limits. Stomach/Bowel: Stomach nonenlarged. No dilated small bowel. No significant colon wall thickening. Partially visualized appendix within normal limits. Vascular/Lymphatic: Non aneurysmal aorta. Small nonspecific retroperitoneal nodes similar compared to prior. No bulky adenopathy is  visualized. Reproductive: Prostate is unremarkable. Other: Trace free fluid in the pelvis.  Negative for free air. Musculoskeletal: Stable lucent lesions in the pelvis. No acute osseous finding. IMPRESSION: 1. Negative for bowel obstruction or significant bowel wall thickening. 2. 8 x 11  mm vague hypodense focus within the liver, not clearly seen on prior studies. Suggest correlation with nonemergent MRI. 3. Trace fluid in the pelvis Electronically Signed   By: Donavan Foil M.D.   On: 04/15/2017 18:56    1910:  Labs reassuring. Pt afebrile. Pt has climbed off the stretcher, gotten himself dressed and is sitting in a chair in the exam room. He is requesting "more pain meds," and "what are you going to give me?"  Will re-dose meds. T/C to General Surgeon Dr. Arnoldo Morale, case discussed, including:  HPI, pertinent PM/SHx, VS/PE, dx testing, ED course and treatment:  Trace fluid in pelvic non-specific, no clear indication for surgical intervention at this time, tx symptomatically and if cannot get N/V symptoms under control admit to hospitalist.   2005:  Pt states he "feels better" after IV morphine and wants to go home now. Has tol PO well while in the ED without N/V. No stooling while in the ED. Dx and testing d/w pt and family.  Questions answered.  Verb understanding, agreeable to d/c home with outpt f/u.   Final Clinical Impressions(s) / ED Diagnoses   Final diagnoses:  None    New Prescriptions New Prescriptions   No medications on file     Francine Graven, DO 04/17/17 1409

## 2017-04-15 NOTE — Telephone Encounter (Signed)
Patient called stating he has been having nausea and vomiting and severe abdominal pains over the last week. He states he is not able to keep much food down and his abdomen hurts so bad it is hard for him to lay straight. Reviewed chart and with provider. Instructed patient to go to ER if pain and nausea & vomiting continues or worsens. Also moved his next appointment up based on last communication with Dr. Talbert Cage  and CT results. New appointment is 04/21/17 at 3:45 PM patient made aware and verbalized understanding of appointment and when to go to ER.

## 2017-04-15 NOTE — Discharge Instructions (Signed)
Eat a bland diet, avoiding greasy, fatty, fried foods, as well as spicy and acidic foods or beverages.  Avoid eating within the hour or 2 before going to bed or laying down.  Also avoid teas, colas, coffee, chocolate, pepermint and spearment.  Increase your fluid intake (ie:  Gatoraide) for the next few days.  Eat a bland diet and advance your diet slowly as you can tolerate it.  Avoid full strength juices, as well as milk and milk products until your diarrhea has resolved. Take the prescriptions as directed.  Call your regular medical doctor and the GI doctor on Monday to schedule a follow up appointment next week.  Return to the Emergency Department immediately if worsening.

## 2017-04-17 LAB — URINE CULTURE: CULTURE: NO GROWTH

## 2017-04-21 ENCOUNTER — Encounter (HOSPITAL_COMMUNITY): Payer: Medicaid Other

## 2017-05-05 DIAGNOSIS — E8809 Other disorders of plasma-protein metabolism, not elsewhere classified: Secondary | ICD-10-CM | POA: Insufficient documentation

## 2017-05-05 NOTE — Assessment & Plan Note (Addendum)
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  Will add SPEP + IFE and light chain to labs today for screening.  He does not have anemia, normal Ca++, no lytic bone lesions on previous imaging, and no evidence of end-organ damage.

## 2017-05-05 NOTE — Assessment & Plan Note (Addendum)
Stage III Hodgkin's Lymphoma with questionable involvement of lingula on PET imaging. BMBX negative. PET with mild hypermetabolism with borderline enlarged bilateral axillary and mediastinal nodes. Bilateral inguinal nodes.  Restaging PET following 2 cycles of ABVD demonstrates a complete remission with Deauville 1 noted and CR on PET scan at time of completion of therapy in November 2016.  Repeat CT imaging in 02/2017 demonstrated a number of shotty thoracic lymph nodes measuring up to 10 mm in size.  He has been to the ED on multiple occasions for abdominal pain.  CT imaging of abd/pelvis has been negative until CT imaging in 03/2017 demonstrated a small hepatic lesion.  Oncology history is updated.  Labs today: CBC diff, CMET, LDH.  I personally reviewed and went over laboratory results with the patient.  The results are noted within this dictation.  I personally reviewed and went over radiographic studies with the patient.  The results are noted within this dictation.  I personally reviewed the images in PACS.  CT imaging in 03/2017 in ED demonstrated a hepatic lesion.  Further evaluation with MRI was recommended.  Order is placed for MRI liver per hepatic protocol to further evaluate hepatic lesion.  For his ongoing abdominal pain with nausea with certain food products, I will refer him to GI for work-up and possible EGD.     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  Return following MRI to review results and further medical oncology recommendations.

## 2017-05-06 ENCOUNTER — Encounter (HOSPITAL_COMMUNITY): Payer: Self-pay | Admitting: Oncology

## 2017-05-06 ENCOUNTER — Encounter (HOSPITAL_COMMUNITY): Payer: Medicaid Other | Attending: Oncology | Admitting: Oncology

## 2017-05-06 ENCOUNTER — Encounter (HOSPITAL_COMMUNITY): Payer: Medicaid Other

## 2017-05-06 VITALS — BP 100/59 | HR 63 | Temp 97.7°F | Resp 18 | Ht 69.0 in | Wt 172.6 lb

## 2017-05-06 DIAGNOSIS — Z9189 Other specified personal risk factors, not elsewhere classified: Secondary | ICD-10-CM

## 2017-05-06 DIAGNOSIS — K769 Liver disease, unspecified: Secondary | ICD-10-CM | POA: Diagnosis not present

## 2017-05-06 DIAGNOSIS — Z8571 Personal history of Hodgkin lymphoma: Secondary | ICD-10-CM

## 2017-05-06 DIAGNOSIS — C8194 Hodgkin lymphoma, unspecified, lymph nodes of axilla and upper limb: Secondary | ICD-10-CM

## 2017-05-06 DIAGNOSIS — R369 Urethral discharge, unspecified: Secondary | ICD-10-CM

## 2017-05-06 DIAGNOSIS — E8809 Other disorders of plasma-protein metabolism, not elsewhere classified: Secondary | ICD-10-CM

## 2017-05-06 MED ORDER — DOXYCYCLINE HYCLATE 100 MG PO TABS: 100.0000 mg | ORAL_TABLET | Freq: Two times a day (BID) | ORAL | 0 refills | Status: AC

## 2017-05-06 NOTE — Progress Notes (Signed)
Prescription for Doxycycline faxed to [facility] in [place].  Patient made aware and verbalized understanding.

## 2017-05-06 NOTE — Progress Notes (Signed)
Patient, No Pcp Per No address on file  Hodgkin lymphoma of lymph nodes of axilla, unspecified Hodgkin lymphoma type ([place])  Liver disease - Plan: MR Abdomen W Wo Contrast, CANCELED: MR Abdomen W Contrast  Hyperproteinemia - Plan: Kappa/lambda light chains, IgG, IgA, IgM, Immunofixation electrophoresis, Protein electrophoresis, serum  Penile discharge - Plan: doxycycline (VIBRA-TABS) 100 MG tablet, RPR+HIV+GC+CT Panel, Miscellaneous [facility] test (send-out), CANCELED: RPR+HIV+GC+CT Panel  At risk for sexually transmitted disease due to partner with sexually transmitted disease - Plan: doxycycline (VIBRA-TABS) 100 MG tablet, RPR+HIV+GC+CT Panel, Miscellaneous [facility] test (send-out), CANCELED: RPR+HIV+GC+CT Panel  CURRENT THERAPY: Observation in accordance with NCCN guidelines.  INTERVAL HISTORY: Robert Acevedo 46 y.o. male returns for followup of Hodgkin's lymphoma status post definitive treatment and recent history of noncompliance with appointments.  HPI Elements   Location: Lymph nodes   Quality: Hodgkin's lymphoma   Severity: Stage III   Duration: Diagnosis in 2016   Context:   Timing:   Modifying Factors:   Associated Signs & Symptoms:    He has been noncompliant with follow-up appointments. He's been to the emergency room on 2 separate occasions for abdominal pain with nausea. He notes that this has resolved. He is now on "acid pills." This intervention has been effective and he denies any abdominal pain or nausea since. He does admit to some foods "not agreeing with me." I think the patient would benefit from GI referral for further evaluation and possible EGD.  He notes that a recurrent sexual partner was recently diagnosed with Trichomoniasis.  Patient reports that she took an antibiotic twice a day for 7 days. I suspect this was gonorrheal in nature due to antibiotic choice. He denies any penile discharge or lesions. He denies any difficulty with urination or  burning. He is interested in emperic treatment and testing.   Review of Systems  Constitutional: Negative.  Negative for chills, fever and weight loss.  HENT: Negative.   Eyes: Negative.   Respiratory: Negative.  Negative for cough.   Cardiovascular: Negative.  Negative for chest pain.  Gastrointestinal: Negative.  Negative for blood in stool, constipation, diarrhea, melena, nausea and vomiting.  Genitourinary: Negative.   Musculoskeletal: Negative.   Skin: Negative.   Neurological: Negative.  Negative for weakness.  Endo/Heme/Allergies: Negative.   Psychiatric/Behavioral: Negative.     Past Medical History:  Diagnosis Date  . Bronchitis   . Hodgkin's lymphoma ([place])   . Lymphadenopathy, inguinal    left    Past Surgical History:  Procedure Laterality Date  . BONE MARROW ASPIRATION Left 01/20/15  . BONE MARROW BIOPSY Left 01/20/15  . INCISION AND DRAINAGE PERITONSILLAR ABSCESS    . KNEE SURGERY    . LYMPH NODE BIOPSY Left 01/13/2015   Procedure: LEFT INGUINAL LYMPH NODE BIOPSY;  Surgeon: Aviva Signs Md, MD;  Location: [facility];  Service: General;  Laterality: Left;  . PORT-A-CATH REMOVAL Left 09/08/2016   Procedure: MINOR REMOVAL PORT-A-CATH;  Surgeon: Aviva Signs, MD;  Location: [facility];  Service: General;  Laterality: Left;  . PORTACATH PLACEMENT Left 01/31/2015   Procedure: INSERTION PORT-A-CATH;  Surgeon: Aviva Signs Md, MD;  Location: [facility];  Service: General;  Laterality: Left;  . WRIST SURGERY     right    Family History  Problem Relation Age of Onset  . Seizures Mother   . Diabetes Father     Social History   Social History  . Marital status: Single    Spouse  name: N/A  . Number of children: N/A  . Years of education: N/A   Social History Main Topics  . Smoking status: Current Every Day Smoker    Packs/day: 0.50    Years: 2.00    Types: Cigarettes  . Smokeless tobacco: Never Used  . Alcohol use Yes     Comment: occasional  . Drug use: Yes    Types:  Marijuana     Comment: daily  . Sexual activity: Yes    Birth control/ protection: None   Other Topics Concern  . None   Social History Narrative  . None     PHYSICAL EXAMINATION  ECOG PERFORMANCE STATUS: 0 - Asymptomatic  Vitals:   05/06/17 1159  BP: (!) 100/59  Pulse: 63  Resp: 18  Temp: 97.7 F (36.5 C)  SpO2: 100%    GENERAL:alert, healthy, no distress, well nourished, well developed, comfortable, cooperative, smiling and unaccompanied SKIN: skin color, texture, turgor are normal, no rashes or significant lesions HEAD: Normocephalic, No masses, lesions, tenderness or abnormalities EYES: normal, EOMI, Conjunctiva are pink and non-injected EARS: External ears normal OROPHARYNX:lips, buccal mucosa, and tongue normal and mucous membranes are moist  NECK: supple, no adenopathy, trachea midline LYMPH:  no palpable lymphadenopathy BREAST:not examined LUNGS: clear to auscultation  HEART: regular rate & rhythm ABDOMEN:abdomen soft BACK: Back symmetric, no curvature. EXTREMITIES:less then 2 second capillary refill, no joint deformities, effusion, or inflammation, no edema, no skin discoloration, no cyanosis  NEURO: alert & oriented x 3 with fluent speech, no focal motor/sensory deficits, gait normal   LABORATORY DATA: CBC    Component Value Date/Time   WBC 7.5 04/15/2017 1457   RBC 4.83 04/15/2017 1457   HGB 14.1 04/15/2017 1457   HCT 42.8 04/15/2017 1457   PLT 214 04/15/2017 1457   MCV 88.6 04/15/2017 1457   MCH 29.2 04/15/2017 1457   MCHC 32.9 04/15/2017 1457   RDW 13.6 04/15/2017 1457   LYMPHSABS 2.3 04/15/2017 1457   MONOABS 0.9 04/15/2017 1457   EOSABS 0.1 04/15/2017 1457   BASOSABS 0.0 04/15/2017 1457      Chemistry      Component Value Date/Time   NA 139 04/15/2017 1457   K 3.8 04/15/2017 1457   CL 101 04/15/2017 1457   CO2 31 04/15/2017 1457   BUN 15 04/15/2017 1457   CREATININE 1.20 04/15/2017 1457      Component Value Date/Time   CALCIUM  9.5 04/15/2017 1457   ALKPHOS 54 04/15/2017 1457   AST 21 04/15/2017 1457   ALT 15 (L) 04/15/2017 1457   BILITOT 0.6 04/15/2017 1457        PENDING LABS:   RADIOGRAPHIC STUDIES:  Ct Abdomen Pelvis W Contrast  Result Date: 04/15/2017 CLINICAL DATA:  Generalized abdominal pain with emesis and nausea, history of lymphoma EXAM: CT ABDOMEN AND PELVIS WITH CONTRAST TECHNIQUE: Multidetector CT imaging of the abdomen and pelvis was performed using the standard protocol following bolus administration of intravenous contrast. CONTRAST:  172m ISOVUE-300 IOPAMIDOL (ISOVUE-300) INJECTION 61% COMPARISON:  03/24/2017, 11/04/2016, 08/05/2016, PET-CT 07/29/2015 FINDINGS: Lower chest: Lung bases demonstrate no acute consolidation or effusion. Normal heart size. Hepatobiliary: 8 x 11 mm small hypodense focus within the left lobe of the liver, not clearly visible on prior exams. No calcified gallstones. No biliary dilatation Pancreas: Unremarkable. No pancreatic ductal dilatation or surrounding inflammatory changes. Spleen: Slightly heterogeneous but no focal abnormality Adrenals/Urinary Tract: Adrenal glands are within normal limits. Negative for hydronephrosis. Bladder within normal limits. Stomach/Bowel:  Stomach nonenlarged. No dilated small bowel. No significant colon wall thickening. Partially visualized appendix within normal limits. Vascular/Lymphatic: Non aneurysmal aorta. Small nonspecific retroperitoneal nodes similar compared to prior. No bulky adenopathy is visualized. Reproductive: Prostate is unremarkable. Other: Trace free fluid in the pelvis.  Negative for free air. Musculoskeletal: Stable lucent lesions in the pelvis. No acute osseous finding. IMPRESSION: 1. Negative for bowel obstruction or significant bowel wall thickening. 2. 8 x 11 mm vague hypodense focus within the liver, not clearly seen on prior studies. Suggest correlation with nonemergent MRI. 3. Trace fluid in the pelvis Electronically  Signed   By: Donavan Foil M.D.   On: 04/15/2017 18:56     PATHOLOGY:    ASSESSMENT AND PLAN:  Hodgkin lymphoma Stage III Hodgkin's Lymphoma with questionable involvement of lingula on PET imaging. BMBX negative. PET with mild hypermetabolism with borderline enlarged bilateral axillary and mediastinal nodes. Bilateral inguinal nodes.  Restaging PET following 2 cycles of ABVD demonstrates a complete remission with Deauville 1 noted and CR on PET scan at time of completion of therapy in November 2016.  Repeat CT imaging in 02/2017 demonstrated a number of shotty thoracic lymph nodes measuring up to 10 mm in size.  He has been to the ED on multiple occasions for abdominal pain.  CT imaging of abd/pelvis has been negative until CT imaging in 03/2017 demonstrated a small hepatic lesion.  Oncology history is updated.  Labs today: CBC diff, CMET, LDH.  I personally reviewed and went over laboratory results with the patient.  The results are noted within this dictation.  I personally reviewed and went over radiographic studies with the patient.  The results are noted within this dictation.  I personally reviewed the images in PACS.  CT imaging in 03/2017 in ED demonstrated a hepatic lesion.  Further evaluation with MRI was recommended.  Order is placed for MRI liver per hepatic protocol to further evaluate hepatic lesion.  For his ongoing abdominal pain with emesis, I will refer him to GI for work-up and possible EGD.   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  Return following MRI to review results and further medical oncology recommendations.  Hyperproteinemia He has developed a hyperproteinemia over the past year that is significant.  Will add SPEP + IFE and light chain to labs today for screening.  He does not have anemia, normal Ca++, no lytic bone lesions on previous imaging, and no evidence of end-organ damage.   3. Liver  disease Small liver lesion noted on most recent CT imaging measuring 1 cm in size. MRI per hepatic protocol is recommended for further evaluation and characterization of this lesion given his history of lymphoma. Orders placed. I have a low suspicion for malignancy at this time. - MR Abdomen W Wo Contrast; Future  4. At risk for sexually transmitted disease due to partner with sexually transmitted disease Recent diagnosis of sexual partner with trichomoniasis, likely of gonorrheal origin given patient's reported antibiotic choice of partner. Patient would like empiric treatment and screening. Condom use as encouraged. - doxycycline (VIBRA-TABS) 100 MG tablet; Take 1 tablet (100 mg total) by mouth 2 (two) times daily.  Dispense: 14 tablet; Refill: 0 - RPR+HIV+GC+CT Panel; Future - Miscellaneous [facility] test (send-out); Future   ORDERS PLACED FOR THIS ENCOUNTER: Orders Placed This Encounter  Procedures  . MR Abdomen W Wo Contrast  . Kappa/lambda light chains  . IgG, IgA, IgM  . Immunofixation electrophoresis  .  Protein electrophoresis, serum  . RPR+HIV+GC+CT Panel  . Miscellaneous [facility] test (send-out)    MEDICATIONS PRESCRIBED THIS ENCOUNTER: Meds ordered this encounter  Medications  . doxycycline (VIBRA-TABS) 100 MG tablet    Sig: Take 1 tablet (100 mg total) by mouth 2 (two) times daily.    Dispense:  14 tablet    Refill:  0    Order Specific Question:   Supervising Provider    Answer:   Brunetta Genera [[id number]]    THERAPY PLAN:  NCCN guidelines for surveillance of Hodgkin's Lymphoma (Age >/= 58) (1.2017):  A. Complete remission should be documented including reversion of PET to "negative" within 3 months following completion of therapy.  B. It is recommended that the patient be provided with a treatment summary at the completion of his/her therapy, including details of radiation therapy, organs at risk, and cumulative anthracycline dosage given.  C. Follow-up with an  oncologist is recommended, especially during the first 5 years after treatment to detect recurrence, and then annually due to the risk of late complications including second cancers and cardiovascular disease. Late relapse or transformation to large cell lymphoma may occur in [place].  D. Frequency and types of tests may vary depending on clinical circumstances: age and stage at diagnosis, social habits, treatment modality, etc. There are few data to support specific recommendations; these represent the range of practice at [employer].   1. Follow-up after completion of treatment up to 5 years:   A. Interim H and P: Every 3-6 months for 1-2 years, then every 6-12 months until year 3, then annually.   B. Annual influenza vaccine.   C. Laboratory studies:    1. CBC, platelets, ESR (if elevated at time of initial diagnosis), chemistry profile as clinically indicated.    2. Thyroid stimulating hormone (TSH) at least annually if radiation therapy to neck.   D. Acceptable to obtain a neck/chest/abdomen/pelvis CT scan with contrast, at 6, 12, and 24 month following completion of therapy, or as clinically indicated. PET/CT only if last PET was Deauville 4-5, to confirm complete response.   E. Counseling: Reproduction, health habits, psychosocial, cardiovascular, breast self-exam, skin cancer risk, end of treatment discussion.   F. Surveillance PET should not be done routinely due to risk of false positives. Management decision should not be based on PET scan alone; clinical or pathologic correlation is needed.   2. Follow-up and monitoring after 5 years:   A. Interim H&P: Annually    1. Annual blood pressure, aggressive management of cardiovascular risk factors.    2. Pneumococcal, meningococcal, and H-flu revaccination after 5-7 years, if patient treated with splenic radiation therapy or previous splenectomy (according to CBC recommendations).    3. Annual influenza vaccine.   B.  Cardiovascular symptoms may emerge at a young age.    1. Consider stress test/echocardiogram at 10 year intervals after treatment is completed.    2. Consider carotid ultrasound at 10 year intervals if neck irradiation.   C. Laboratory studies:    1. CBC, platelets, chemistry profile annually    2. TSH at least annually if radiation therapy to neck    3. Biannual lipids    4. Annual fasting glucose   D. Annual breast screening: Initiate 8-10 years post therapy, or at age 88, whichever comes first, if chest or axillary radiation. The NCCN Hodgkin Lymphoma Guidelines Panel recommends breast MRI in addition to mammography for women who've received irradiation to the chest between ages 10-30 years, which  is consistent with the [place] (ACS) Guidelines. Consider referral to a breast specialist.   E. Perform other routine surveillance tests for cervical, colorectal, endometrial, lung, and prostate cancer as per the ACS Cancer Screening Guidelines.   F. Counseling: Reproduction, health habits, psychosocial, cardiovascular, breast self-exam, and skin cancer risk.   G. Treatment summary and consideration of transfer to primary care provider.   H. Consider a referral to a survivorship clinic.     All questions were answered. The patient knows to call the clinic with any problems, questions or concerns. We can certainly see the patient much sooner if necessary.  Patient and plan discussed with Dr. Twana First and she is in agreement with the aforementioned.   This note is electronically signed by: Doy Mince 05/06/2017 12:34 PM

## 2017-05-06 NOTE — Patient Instructions (Signed)
[place] at [facility]  Discharge Instructions:  Labs today MRI of the liver to be scheduled.  Referral to GI for abdominal pain. Return in 3 to 4 weeks for follow up. Script for doxycycline.  _______________________________________________________________  Thank you for choosing [place] at [facility] to provide your oncology and hematology care.  To afford each patient quality time with our providers, please arrive at least 15 minutes before your scheduled appointment.  You need to re-schedule your appointment if you arrive 10 or more minutes late.  We strive to give you quality time with our providers, and arriving late affects you and other patients whose appointments are after yours.  Also, if you no show three or more times for appointments you may be dismissed from the clinic.  Again, thank you for choosing [place] [facility] hope is that these requests will allow you access to exceptional care and in a timely manner. _______________________________________________________________  If you have questions after your visit, please contact our office at [phone] between the hours of 8:30 a.m. and 5:00 p.m. Voicemails left after 4:30 p.m. will not be returned until the following business day. _______________________________________________________________  For prescription refill requests, have your pharmacy contact our office. _______________________________________________________________  Recommendations made by the consultant and any test results will be sent to your referring physician. _______________________________________________________________

## 2017-05-07 LAB — IGG, IGA, IGM
IgA: 308 mg/dL (ref 90–386)
IgG (Immunoglobin G), Serum: 2899 mg/dL — ABNORMAL HIGH (ref 700–1600)
IgM (Immunoglobulin M), Srm: 153 mg/dL (ref 20–172)

## 2017-05-09 LAB — PROTEIN ELECTROPHORESIS, SERUM
A/G Ratio: 0.7 (ref 0.7–1.7)
ALBUMIN ELP: 3.4 g/dL (ref 2.9–4.4)
ALPHA-1-GLOBULIN: 0.2 g/dL (ref 0.0–0.4)
Alpha-2-Globulin: 0.8 g/dL (ref 0.4–1.0)
BETA GLOBULIN: 1.1 g/dL (ref 0.7–1.3)
GAMMA GLOBULIN: 2.6 g/dL — AB (ref 0.4–1.8)
Globulin, Total: 4.7 g/dL — ABNORMAL HIGH (ref 2.2–3.9)
TOTAL PROTEIN ELP: 8.1 g/dL (ref 6.0–8.5)

## 2017-05-09 LAB — KAPPA/LAMBDA LIGHT CHAINS
KAPPA FREE LGHT CHN: 51.5 mg/L — AB (ref 3.3–19.4)
KAPPA, LAMDA LIGHT CHAIN RATIO: 1.73 — AB (ref 0.26–1.65)
LAMDA FREE LIGHT CHAINS: 29.8 mg/L — AB (ref 5.7–26.3)

## 2017-05-09 LAB — IMMUNOFIXATION ELECTROPHORESIS
IGA: 309 mg/dL (ref 90–386)
IGG (IMMUNOGLOBIN G), SERUM: 2966 mg/dL — AB (ref 700–1600)
IgM (Immunoglobulin M), Srm: 156 mg/dL (ref 20–172)
TOTAL PROTEIN ELP: 7.9 g/dL (ref 6.0–8.5)

## 2017-05-25 ENCOUNTER — Ambulatory Visit (HOSPITAL_COMMUNITY): Payer: Medicaid Other | Attending: Oncology

## 2017-05-30 ENCOUNTER — Encounter (HOSPITAL_COMMUNITY): Payer: Medicaid Other | Attending: Oncology | Admitting: Oncology

## 2017-05-30 VITALS — BP 114/72 | HR 65 | Temp 98.5°F | Resp 20 | Wt 172.9 lb

## 2017-05-30 DIAGNOSIS — R918 Other nonspecific abnormal finding of lung field: Secondary | ICD-10-CM

## 2017-05-30 DIAGNOSIS — Z8571 Personal history of Hodgkin lymphoma: Secondary | ICD-10-CM | POA: Diagnosis not present

## 2017-05-30 DIAGNOSIS — C8190 Hodgkin lymphoma, unspecified, unspecified site: Secondary | ICD-10-CM

## 2017-05-30 DIAGNOSIS — E8809 Other disorders of plasma-protein metabolism, not elsewhere classified: Secondary | ICD-10-CM | POA: Diagnosis not present

## 2017-05-30 DIAGNOSIS — K769 Liver disease, unspecified: Secondary | ICD-10-CM | POA: Diagnosis not present

## 2017-05-30 NOTE — Patient Instructions (Signed)
[facility] at [facility] Discharge Instructions  RECOMMENDATIONS MADE BY THE CONSULTANT AND ANY TEST RESULTS WILL BE SENT TO YOUR REFERRING PHYSICIAN.  Today you saw Dr. Zhou  Thank you for choosing [facility] at [facility] to provide your oncology and hematology care.  To afford each patient quality time with our provider, please arrive at least 15 minutes before your scheduled appointment time.    If you have a lab appointment with the Cancer Center please come in thru the  Main Entrance and check in at the main information desk  You need to re-schedule your appointment should you arrive 10 or more minutes late.  We strive to give you quality time with our providers, and arriving late affects you and other patients whose appointments are after yours.  Also, if you no show three or more times for appointments you may be dismissed from the clinic at the providers discretion.     Again, thank you for choosing [facility].  Our hope is that these requests will decrease the amount of time that you wait before being seen by our physicians.       _____________________________________________________________  Should you have questions after your visit to [facility], please contact our office at [phone] between the hours of 8:30 a.m. and 4:30 p.m.  Voicemails left after 4:30 p.m. will not be returned until the following business day.  For prescription refill requests, have your pharmacy contact our office.       Resources For Cancer Patients and their Caregivers ? American Cancer Society: Can assist with transportation, wigs, general needs, runs Look Good Feel Better.        1-888-227-6333 ? Cancer Care: Provides financial assistance, online support groups, medication/co-pay assistance.  1-800-813-HOPE (4673) ? [facility] Assists [place] cancer patients and their families through  emotional , educational and financial support.  336-427-4357 ? [place] DSS Where to apply for food stamps, Medicaid and utility assistance. 336-342-1394 ? [facility]: Transportation to medical appointments. 336-347-2287 ? Social Security Administration: May apply for disability if have a Stage IV cancer. 336-342-7796 1-800-772-1213 ? [place] Aging, Disability and Transit Services: Assists with nutrition, care and transit needs. 336-349-2343  Cancer Center Support Programs: @10RELATIVEDAYS@ > Cancer Support Group  2nd Tuesday of the month 1pm-2pm, Journey Room  > Creative Journey  3rd Tuesday of the month 1130am-1pm, Journey Room  > Look Good Feel Better  1st Wednesday of the month 10am-12 noon, Journey Room (Call American Cancer Society to register 1-800-395-5775)   

## 2017-05-30 NOTE — Progress Notes (Signed)
Patient, No Pcp Per No address on file  Hodgkin lymphoma, unspecified Hodgkin lymphoma type, unspecified body region [facility])  CURRENT THERAPY: Observation in accordance with NCCN guidelines.  INTERVAL HISTORY: Lathan K Grundman 46 y.o. male returns for followup of Hodgkin's lymphoma status post definitive treatment and recent history of noncompliance with appointments.  Patient presents today for follow-up. He states he's been doing well since his last visit. He states that his appetite is very good, he feels like he eats all the time but is still hungry. He is working full time and states that he is doing well with just occasional fatigue. He denies any drenching night sweats or unexplained fevers and chills. Denies any pain. Denies palpating any lymphadenopathy.  Review of Systems  Constitutional: Negative.  Negative for chills, fever and weight loss.  HENT: Negative.   Eyes: Negative.   Respiratory: Negative.  Negative for cough.   Cardiovascular: Negative.  Negative for chest pain.  Gastrointestinal: Negative.  Negative for blood in stool, constipation, diarrhea, melena, nausea and vomiting.  Genitourinary: Negative.   Musculoskeletal: Negative.   Skin: Negative.   Neurological: Negative.  Negative for weakness.  Endo/Heme/Allergies: Negative.   Psychiatric/Behavioral: Negative.     Past Medical History:  Diagnosis Date  . Bronchitis   . Hodgkin's lymphoma ([place])   . Lymphadenopathy, inguinal    left    Past Surgical History:  Procedure Laterality Date  . BONE MARROW ASPIRATION Left 01/20/15  . BONE MARROW BIOPSY Left 01/20/15  . INCISION AND DRAINAGE PERITONSILLAR ABSCESS    . KNEE SURGERY    . LYMPH NODE BIOPSY Left 01/13/2015   Procedure: LEFT INGUINAL LYMPH NODE BIOPSY;  Surgeon: Aviva Signs Md, MD;  Location: [facility];  Service: General;  Laterality: Left;  . PORT-A-CATH REMOVAL Left 09/08/2016   Procedure: MINOR REMOVAL PORT-A-CATH;  Surgeon: Aviva Signs, MD;  Location: [facility];  Service: General;  Laterality: Left;  . PORTACATH PLACEMENT Left 01/31/2015   Procedure: INSERTION PORT-A-CATH;  Surgeon: Aviva Signs Md, MD;  Location: [facility];  Service: General;  Laterality: Left;  . WRIST SURGERY     right    Family History  Problem Relation Age of Onset  . Seizures Mother   . Diabetes Father     Social History   Social History  . Marital status: Single    Spouse name: N/A  . Number of children: N/A  . Years of education: N/A   Social History Main Topics  . Smoking status: Current Every Day Smoker    Packs/day: 0.50    Years: 2.00    Types: Cigarettes  . Smokeless tobacco: Never Used  . Alcohol use Yes     Comment: occasional  . Drug use: Yes    Types: Marijuana     Comment: daily  . Sexual activity: Yes    Birth control/ protection: None   Other Topics Concern  . Not on file   Social History Narrative  . No narrative on file     PHYSICAL EXAMINATION  ECOG PERFORMANCE STATUS: 0 - Asymptomatic  Vitals:   05/30/17 0952  BP: 114/72  Pulse: 65  Resp: 20  Temp: 98.5 F (36.9 C)  SpO2: 100%    Constitutional: Well-developed, well-nourished, and in no distress.   HENT:  Head: Normocephalic and atraumatic.  Mouth/Throat: No oropharyngeal exudate. Mucosa moist. Eyes: Pupils are equal, round, and reactive to light. Conjunctivae are normal. No scleral icterus.  Neck: Normal  range of motion. Neck supple. No JVD present.  Cardiovascular: Normal rate, regular rhythm and normal heart sounds.  Exam reveals no gallop and no friction rub.   No murmur heard. Pulmonary/Chest: Effort normal and breath sounds normal. No respiratory distress. No wheezes.No rales.  Abdominal: Soft. Bowel sounds are normal. No distension. There is no tenderness. There is no guarding.  Musculoskeletal: No edema or tenderness.  Lymphadenopathy:    No cervical or supraclavicular adenopathy.  Neurological: Alert and oriented to person, place,  and time. No cranial nerve deficit.  Skin: Skin is warm and dry. No rash noted. No erythema. No pallor.  Psychiatric: Affect and judgment normal.    LABORATORY DATA: CBC    Component Value Date/Time   WBC 7.5 04/15/2017 1457   RBC 4.83 04/15/2017 1457   HGB 14.1 04/15/2017 1457   HCT 42.8 04/15/2017 1457   PLT 214 04/15/2017 1457   MCV 88.6 04/15/2017 1457   MCH 29.2 04/15/2017 1457   MCHC 32.9 04/15/2017 1457   RDW 13.6 04/15/2017 1457   LYMPHSABS 2.3 04/15/2017 1457   MONOABS 0.9 04/15/2017 1457   EOSABS 0.1 04/15/2017 1457   BASOSABS 0.0 04/15/2017 1457      Chemistry      Component Value Date/Time   NA 139 04/15/2017 1457   K 3.8 04/15/2017 1457   CL 101 04/15/2017 1457   CO2 31 04/15/2017 1457   BUN 15 04/15/2017 1457   CREATININE 1.20 04/15/2017 1457      Component Value Date/Time   CALCIUM 9.5 04/15/2017 1457   ALKPHOS 54 04/15/2017 1457   AST 21 04/15/2017 1457   ALT 15 (L) 04/15/2017 1457   BILITOT 0.6 04/15/2017 1457        PENDING LABS:   RADIOGRAPHIC STUDIES:  No results found.   PATHOLOGY:    ASSESSMENT AND PLAN:  Hodgkin lymphoma Stage III Hodgkin's Lymphoma with questionable involvement of lingula on PET imaging. BMBX negative. PET with mild hypermetabolism with borderline enlarged bilateral axillary and mediastinal nodes. Bilateral inguinal nodes.  Restaging PET following 2 cycles of ABVD demonstrates a complete remission with Deauville 1 noted and CR on PET scan at time of completion of therapy in November 2016.  Repeat CT imaging in 02/2017 demonstrated a number of shotty thoracic lymph nodes measuring up to 10 mm in size.  He has been to the ED on multiple occasions for abdominal pain.  CT imaging of abd/pelvis has been negative until CT imaging in 03/2017 demonstrated a small hepatic lesion.  -Clinically NED.  -Repeat labs and restaging CT C/A/P in 6 months.   Hyperproteinemia -He has developed a hyperproteinemia over the past year  that is significant.  He does not have anemia, normal Ca++, no lytic bone lesions on previous imaging, and no evidence of end-organ damage. -SPEP negative for M-Spkie. 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   Liver disease 8 x 11 mm small hypodense focus within the left lobe of the liver seen on CT abd/pelvis on 04/15/17, not clearly visible on prior exams I have a low suspicion for malignancy at this time. -Continue monitoring. MRI abd ordered on last visit, not yet performed. -Will continue long term surveillance and monitor it on next CT abd also in 6 months.  Bilateral pulm nodules, largest 6 mm -Continue surveillance. -Repeat CT chest in 6 months.  RTC in 6 months for follow up.   ORDERS PLACED FOR THIS ENCOUNTER: Orders Placed This Encounter  Procedures  . CT Chest W Contrast  . CT Abdomen Pelvis W Contrast  . CBC with Differential  . Comprehensive metabolic panel  . Lactate dehydrogenase      THERAPY PLAN:  NCCN guidelines for surveillance of Hodgkin's Lymphoma (Age >/= 39) (1.2017):  A. Complete remission should be documented including reversion of PET to "negative" within 3 months following completion of therapy.  B. It is recommended that the patient be provided with a treatment summary at the completion of his/her therapy, including details of radiation therapy, organs at risk, and cumulative anthracycline dosage given.  C. Follow-up with an oncologist is recommended, especially during the first 5 years after treatment to detect recurrence, and then annually due to the risk of late complications including second cancers and cardiovascular disease. Late relapse or transformation to large cell lymphoma may occur in [place].  D. Frequency and types of tests may vary depending on clinical circumstances: age and stage at diagnosis, social habits, treatment modality, etc. There are few data to support specific recommendations; these  represent the range of practice at [facility].   1. Follow-up after completion of treatment up to 5 years:   A. Interim H and P: Every 3-6 months for 1-2 years, then every 6-12 months until year 3, then annually.   B. Annual influenza vaccine.   C. Laboratory studies:    1. CBC, platelets, ESR (if elevated at time of initial diagnosis), chemistry profile as clinically indicated.    2. Thyroid stimulating hormone (TSH) at least annually if radiation therapy to neck.   D. Acceptable to obtain a neck/chest/abdomen/pelvis CT scan with contrast, at 6, 12, and 24 month following completion of therapy, or as clinically indicated. PET/CT only if last PET was Deauville 4-5, to confirm complete response.   E. Counseling: Reproduction, health habits, psychosocial, cardiovascular, breast self-exam, skin cancer risk, end of treatment discussion.   F. Surveillance PET should not be done routinely due to risk of false positives. Management decision should not be based on PET scan alone; clinical or pathologic correlation is needed.   2. Follow-up and monitoring after 5 years:   A. Interim H&P: Annually    1. Annual blood pressure, aggressive management of cardiovascular risk factors.    2. Pneumococcal, meningococcal, and H-flu revaccination after 5-7 years, if patient treated with splenic radiation therapy or previous splenectomy (according to CBC recommendations).    3. Annual influenza vaccine.   B. Cardiovascular symptoms may emerge at a young age.    1. Consider stress test/echocardiogram at 10 year intervals after treatment is completed.    2. Consider carotid ultrasound at 10 year intervals if neck irradiation.   C. Laboratory studies:    1. CBC, platelets, chemistry profile annually    2. TSH at least annually if radiation therapy to neck    3. Biannual lipids    4. Annual fasting glucose   D. Annual breast screening: Initiate 8-10 years post therapy, or at age 59, whichever comes  first, if chest or axillary radiation. The NCCN Hodgkin Lymphoma Guidelines Panel recommends breast MRI in addition to mammography for women who've received irradiation to the chest between ages 43-30 years, which is consistent with the [place] (ACS) Guidelines. Consider referral to a breast specialist.   E. Perform other routine surveillance tests for cervical, colorectal, endometrial, lung, and prostate cancer as per the ACS Cancer Screening Guidelines.   F. Counseling: Reproduction, health habits, psychosocial, cardiovascular, breast self-exam, and skin cancer  risk.   G. Treatment summary and consideration of transfer to primary care provider.   H. Consider a referral to a survivorship clinic.     All questions were answered. The patient knows to call the clinic with any problems, questions or concerns. We can certainly see the patient much sooner if necessary.  This note is electronically signed by: Twana First, MD 05/30/2017 9:51 AM

## 2017-06-06 ENCOUNTER — Ambulatory Visit (HOSPITAL_COMMUNITY): Payer: Medicaid Other

## 2017-06-06 ENCOUNTER — Other Ambulatory Visit (HOSPITAL_COMMUNITY): Payer: Medicaid Other

## 2017-06-09 ENCOUNTER — Ambulatory Visit (HOSPITAL_COMMUNITY): Payer: Medicaid Other

## 2017-11-28 ENCOUNTER — Other Ambulatory Visit (HOSPITAL_COMMUNITY): Payer: Self-pay | Admitting: *Deleted

## 2017-11-28 DIAGNOSIS — C8190 Hodgkin lymphoma, unspecified, unspecified site: Secondary | ICD-10-CM

## 2017-11-29 ENCOUNTER — Inpatient Hospital Stay (HOSPITAL_COMMUNITY): Payer: Medicaid Other | Attending: Internal Medicine

## 2017-11-29 DIAGNOSIS — Z8571 Personal history of Hodgkin lymphoma: Secondary | ICD-10-CM | POA: Diagnosis not present

## 2017-11-29 DIAGNOSIS — C8190 Hodgkin lymphoma, unspecified, unspecified site: Secondary | ICD-10-CM

## 2017-11-29 DIAGNOSIS — K769 Liver disease, unspecified: Secondary | ICD-10-CM | POA: Diagnosis not present

## 2017-11-29 LAB — LACTATE DEHYDROGENASE: LDH: 140 U/L (ref 98–192)

## 2017-11-29 LAB — COMPREHENSIVE METABOLIC PANEL
ALBUMIN: 3.7 g/dL (ref 3.5–5.0)
ALK PHOS: 53 U/L (ref 38–126)
ALT: 15 U/L — AB (ref 17–63)
ANION GAP: 8 (ref 5–15)
AST: 21 U/L (ref 15–41)
BUN: 14 mg/dL (ref 6–20)
CHLORIDE: 102 mmol/L (ref 101–111)
CO2: 30 mmol/L (ref 22–32)
Calcium: 9.3 mg/dL (ref 8.9–10.3)
Creatinine, Ser: 1.14 mg/dL (ref 0.61–1.24)
GFR calc non Af Amer: >60 mL/min (ref >60)
GLUCOSE: 97 mg/dL (ref 65–99)
Potassium: 4.2 mmol/L (ref 3.5–5.1)
SODIUM: 140 mmol/L (ref 135–145)
Total Bilirubin: 0.5 mg/dL (ref 0.3–1.2)
Total Protein: 9.2 g/dL — ABNORMAL HIGH (ref 6.5–8.1)

## 2017-11-29 LAB — CBC WITH DIFFERENTIAL/PLATELET
BASOS PCT: 0 %
Basophils Absolute: 0 10*3/uL (ref 0.0–0.1)
EOS PCT: 2 %
Eosinophils Absolute: 0.1 10*3/uL (ref 0.0–0.7)
HCT: 42.5 % (ref 39.0–52.0)
HEMOGLOBIN: 13.5 g/dL (ref 13.0–17.0)
LYMPHS ABS: 2 10*3/uL (ref 0.7–4.0)
Lymphocytes Relative: 35 %
MCH: 28.7 pg (ref 26.0–34.0)
MCHC: 31.8 g/dL (ref 30.0–36.0)
MCV: 90.4 fL (ref 78.0–100.0)
Monocytes Absolute: 0.9 10*3/uL (ref 0.1–1.0)
Monocytes Relative: 15 %
NEUTROS PCT: 48 %
Neutro Abs: 2.8 10*3/uL (ref 1.7–7.7)
PLATELETS: 199 10*3/uL (ref 150–400)
RBC: 4.7 MIL/uL (ref 4.22–5.81)
RDW: 15 % (ref 11.5–15.5)
WBC: 5.7 10*3/uL (ref 4.0–10.5)

## 2017-12-06 ENCOUNTER — Ambulatory Visit (HOSPITAL_COMMUNITY)
Admission: RE | Admit: 2017-12-06 | Discharge: 2017-12-06 | Disposition: A | Discharge status: Home / Self Care | Payer: Medicaid Other | Source: Ambulatory Visit | Attending: Oncology | Admitting: Oncology

## 2017-12-06 DIAGNOSIS — C8190 Hodgkin lymphoma, unspecified, unspecified site: Secondary | ICD-10-CM

## 2017-12-06 DIAGNOSIS — J439 Emphysema, unspecified: Secondary | ICD-10-CM | POA: Insufficient documentation

## 2017-12-06 DIAGNOSIS — R590 Localized enlarged lymph nodes: Secondary | ICD-10-CM | POA: Insufficient documentation

## 2017-12-06 DIAGNOSIS — M899 Disorder of bone, unspecified: Secondary | ICD-10-CM | POA: Diagnosis not present

## 2017-12-06 MED ORDER — IOPAMIDOL (ISOVUE-300) INJECTION 61%
100.0000 mL | Freq: Once | INTRAVENOUS | Status: AC | PRN
  Administered 2017-12-06: 100 mL via INTRAVENOUS

## 2017-12-09 ENCOUNTER — Ambulatory Visit (HOSPITAL_COMMUNITY): Payer: Medicaid Other | Admitting: Internal Medicine

## 2017-12-09 ENCOUNTER — Ambulatory Visit (HOSPITAL_COMMUNITY): Payer: Medicaid Other | Admitting: Adult Health

## 2017-12-19 ENCOUNTER — Ambulatory Visit (HOSPITAL_COMMUNITY): Payer: Medicaid Other | Admitting: Internal Medicine

## 2017-12-30 ENCOUNTER — Other Ambulatory Visit: Payer: Self-pay

## 2017-12-30 ENCOUNTER — Encounter (HOSPITAL_COMMUNITY): Payer: Self-pay | Admitting: Internal Medicine

## 2017-12-30 ENCOUNTER — Inpatient Hospital Stay (HOSPITAL_COMMUNITY): Payer: Medicaid Other | Attending: Internal Medicine | Admitting: Internal Medicine

## 2017-12-30 VITALS — BP 90/56 | HR 73 | Temp 98.4°F | Resp 18 | Wt 167.2 lb

## 2017-12-30 DIAGNOSIS — C8190 Hodgkin lymphoma, unspecified, unspecified site: Secondary | ICD-10-CM | POA: Diagnosis present

## 2017-12-30 DIAGNOSIS — F17210 Nicotine dependence, cigarettes, uncomplicated: Secondary | ICD-10-CM | POA: Insufficient documentation

## 2017-12-30 DIAGNOSIS — M899 Disorder of bone, unspecified: Secondary | ICD-10-CM

## 2017-12-30 DIAGNOSIS — R630 Anorexia: Secondary | ICD-10-CM

## 2017-12-30 DIAGNOSIS — Z8571 Personal history of Hodgkin lymphoma: Secondary | ICD-10-CM

## 2017-12-30 DIAGNOSIS — K7689 Other specified diseases of liver: Secondary | ICD-10-CM

## 2017-12-30 DIAGNOSIS — C8170 Other classical Hodgkin lymphoma, unspecified site: Secondary | ICD-10-CM

## 2017-12-30 NOTE — Progress Notes (Signed)
Diagnosis Other classical Hodgkin lymphoma, unspecified body region [facility]) - Plan: CBC with Differential/Platelet, Comprehensive metabolic panel, Lactate dehydrogenase, Protein electrophoresis, serum, Kappa/Lambda Light Chains, Free, With Ratio, 24Hr. Urine, IgG, IgA, IgM, DG Bone Survey Met, CT Chest W Contrast, CT Abdomen Pelvis W Contrast, Sedimentation rate  Bone lesion - Plan: CBC with Differential/Platelet, Comprehensive metabolic panel, Lactate dehydrogenase, Protein electrophoresis, serum, Kappa/Lambda Light Chains, Free, With Ratio, 24Hr. Urine, IgG, IgA, IgM, DG Bone Survey Met, CT Chest W Contrast, CT Abdomen Pelvis W Contrast, Sedimentation rate  Staging Cancer Staging Hodgkin lymphoma [facility]) Staging form: Lymphoid Neoplasms, AJCC 6th Edition - Clinical stage from 03/04/2015: Stage III - Signed by Baird Cancer, PA-C on 11/04/2015 Staging comments: Questionable involvement of lingula on PET imaging. BMBX negative. PET with mild hyper metabolism with borderline enlarged bilateral axillar and mediastinal nodes. Bilateral inguinal nodes.   NOTE: patient has lytic lesion in the pelvis that are NOT PET avid. Not biopsied   Assessment and Plan:  1.  Hodgkin lymphoma.  Patient was previously followed by Dr. Talbert Cage.  He was diagnosed with Stage III Hodgkin's Lymphoma with questionable involvement of lingula on PET imaging. BMBX negative. PET with mild hypermetabolism with borderline enlarged bilateral axillary and mediastinal nodes. Bilateral inguinal nodes.  Restaging PET following 2 cycles of ABVD demonstrates a complete remission with Deauville 1 noted and CR on PET scan at time of completion of therapy in November 2016.  Repeat CT imaging in 02/2017 demonstrated a number of shotty thoracic lymph nodes measuring up to 10 mm in size.  He has been to the ED on multiple occasions for abdominal pain.  CT imaging of abd/pelvis has been negative until CT imaging in 03/2017 demonstrated a small hepatic  lesion.  Labs done 11/29/2017 show white count 5.7 hemoglobin 13.5 platelets 199,000.  Calcium 9.3, creatinine 1.14, LDH 140.  CT chest abdomen pelvis done 12/06/2017 showed IMPRESSION: 1. Stable CT appearance of the chest, abdomen and pelvis. Small scattered residual lymph nodes but no progressive lymphadenopathy. 2. No worrisome pulmonary lesions. Age advanced emphysematous changes. 3. Stable lytic pelvic bone lesions. No new or progressive findings.  The patient will return to clinic in 6 months for repeat labs and CT CAP.  He is advised to notify the office if he has any problems prior to his next visit.  2.  Lytic pelvic bone lesions.  On CT done 12/06/2017 findings were stable.  He has previously also undergone an MRI of the pelvis 08/2015 that showed  IMPRESSION: Multiple stable lytic slightly enhancing lesions in the iliac bones and sacrum, unchanged since 12/20/2014. Even though biopsy was negative for myeloma in May 2016, I think the patient remains at increased risk for development of myeloma based on the appearance.  He is undergone SPEP in September 2018 that was negative.  Kappa lambda light chain ratio was normal.  He will have repeat myeloma labs performed on return to clinic in 6 months.  He is set up for skeletal survey for bone evaluation.  When questioned he reports he had undergone hernia surgery as a child.  He denies any trauma from an accident.  3.  Liver lesion.  This was reported on prior imaging in 2018.  Recent scan done April 2019 shows no liver lesions.  Liver function tests within normal limits.  Is undergone HIV test in the past that was negative.  4.  Anorexia and weight loss.  He will be referred to GI for evaluation.  5.  Pulmonary  lesions.  This was reported on prior imaging.  He has no worrisome lung lesions noted on recent CT done April 2019.  He has emphysematous changes.  He is a smoker and smoking cessation is recommended.  6.  Elevated protein.  Pt had  SPEP done in 04/2017 that was negative.  UPEP negative.  Will repeat SPEP on RTC and pt is set up for skeletal survey.    Interval History:  Hodgkin lymphoma.  Stage III Hodgkin's Lymphoma with questionable involvement of lingula on PET imaging. BMBX negative. PET with mild hypermetabolism with borderline enlarged bilateral axillary and mediastinal nodes. Bilateral inguinal nodes.  Restaging PET following 2 cycles of ABVD demonstrates a complete remission with Deauville 1 noted and CR on PET scan at time of completion of therapy in November 2016.  Repeat CT imaging in 02/2017 demonstrated a number of shotty thoracic lymph nodes measuring up to 10 mm in size.  He has been to the ED on multiple occasions for abdominal pain.  CT imaging of abd/pelvis has been negative until CT imaging in 03/2017 demonstrated a small hepatic lesion.   Current Status:  Pt is seen today for follow-up to go over CT scans.  He reports problems with anorexia and weight loss.  He denies any fevers, chills, night sweats, and has noted no adenopathy.     Hodgkin lymphoma ([place])   01/13/2015 Initial Biopsy    L inguinal LN biopsy on 01/13/2015 with Classical Hodgkin Lymphoma, nodular sclerosing type      01/20/2015 Bone Marrow Biopsy    normal BMBX, mild polyclonal plasmacytosis      01/28/2015 Imaging    PET/CT Mild hyper metabolism is associated with the borderline enlarged bilateral axillary and mediastinal lymph nodes. There is more intense FDG uptake associated with bilateral inguinal lymph nodes. focal area of peripheral consolidation in the lingula.      01/29/2015 Imaging    MUGA with EF 69%      01/29/2015 Imaging    1.spirometry is normal .no change with bronchodilator. lung volumes are normal. airway resistance somewhat high. DLCO normal decreased MIP/MEP may indicate weakness of the bellows function of the lung.       02/04/2015 - 07/08/2015 Chemotherapy    ABVD x 6 cycles      03/28/2015 PET scan    No adenopathy  or abnormal hypermetabolic activity along the previous axillary, mediastinal, and inguinal lymph node chains. Deauville 1.      03/31/2015 Imaging    MUGA- Normal LEFT ventricular ejection fraction of 70%, not significantly changed from the 69% calculated on the previous study of 01/29/2015.      05/30/2015 Imaging    MUGA- No significant change in ejection fraction equal 73%      07/29/2015 Imaging    PET/CT no specific evidence of recurrent metabolically active Hodgkin's      07/29/2015 Remission         09/10/2015 Imaging    MRI pelvis- Multiple stable lytic slightly enhancing lesions in the iliac bones and sacrum, unchanged since 12/20/2014. Even though biopsy was negative for myeloma in May 2016, I think the patient remains at increased risk for development of myeloma.      11/26/2015 Pathology Results    BMBX negative, polyclonal plasmacytosis noted      01/05/2016 Imaging    CT neck- No evidence of recurrent lymphoma in the neck.      01/05/2016 Imaging    CT CAP- Small bilateral axillary nodes and  AP window mediastinal node, within normal limits and non FDG avid on prior PET. No suspicious lymphadenopathy in the chest, abdomen, or pelvis by CT size criteria. Spleen is normal in size.       08/05/2016 Imaging    Stable exam. No evidence of recurrent lymphoma or other acute findings.      03/24/2017 Imaging    CT CAP- Numerous shotty lymph nodes in the mediastinum, bilateral axilla, left subpectoral chest wall, and bilateral inguinal regions, the largest measuring up to 10 mm in short axis. These are nonspecific, however lymphoma recurrence cannot be excluded.  Numerous solid and ground-glass pulmonary nodules. The largest 6.6 mm ground-glass pulmonary nodule in the left upper lobe is stable from 2016. Area of airspace consolidation, round atelectasis or pulmonary nodule in the right middle lobe measures 13 mm. Non-contrast chest CT at 3-6 months is recommended. If the  nodules are stable at time of repeat CT, then future CT at 18-24 months (from today's scan) is considered optional for low-risk patients, but is recommended for high-risk patients. This recommendation follows the consensus statement: Guidelines for Management of Incidental Pulmonary Nodules Detected on CT Images: From the Fleischner Society 2017; Radiology 2017; 284:228-243.  Stable appearance of known bilateral pelvic lytic osseous lesions.      04/15/2017 Imaging    CT abd/pelvis- 1. Negative for bowel obstruction or significant bowel wall thickening. 2. 8 x 11 mm vague hypodense focus within the liver, not clearly seen on prior studies. Suggest correlation with nonemergent MRI. 3. Trace fluid in the pelvis        Problem List Patient Active Problem List   Diagnosis Date Noted  . 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 Nuclear sclerotic cataract of both eyes [H25.13] 12/03/2016  . 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 Retinal edema [H35.81] 12/03/2016  . Port catheter in place [facility] 02/05/2016  . Uveitis [H20.9] 08/15/2015  . Hodgkin lymphoma ([place]) [C81.90] 01/23/2015  . Lytic bone lesion of hip [M89.8X5]   . Abnormal presence of protein in urine [R80.9]     Past Medical History Past Medical History:  Diagnosis Date  . Bronchitis   . Hodgkin's lymphoma ([place])   . Lymphadenopathy, inguinal    left    Past Surgical History Past Surgical History:  Procedure Laterality Date  . BONE MARROW ASPIRATION Left 01/20/15  . BONE MARROW BIOPSY Left 01/20/15  . INCISION AND DRAINAGE PERITONSILLAR ABSCESS    . KNEE SURGERY    . LYMPH NODE BIOPSY Left 01/13/2015   Procedure: LEFT INGUINAL LYMPH NODE BIOPSY;  Surgeon: Aviva Signs Md, MD;  Location: [facility];  Service: General;  Laterality: Left;  . PORT-A-CATH REMOVAL Left 09/08/2016   Procedure: MINOR REMOVAL PORT-A-CATH;   Surgeon: Aviva Signs, MD;  Location: [facility];  Service: General;  Laterality: Left;  . PORTACATH PLACEMENT Left 01/31/2015   Procedure: INSERTION PORT-A-CATH;  Surgeon: Aviva Signs Md, MD;  Location: [facility];  Service: General;  Laterality: Left;  . WRIST SURGERY     right    Family History Family History  Problem Relation Age of Onset  . Seizures Mother   . Diabetes Father      Social History  reports that he has been smoking cigarettes.  He has a 1.00 pack-year smoking history. He has never used smokeless tobacco. He reports that he drinks alcohol. He reports that he has current or past drug history. Drug: Marijuana.  Medications  Current Outpatient Medications:  Marland Kitchen  Multiple Vitamin (MULTI-VITAMINS) TABS, Take by mouth., Disp: , Rfl:  .  predniSONE (DELTASONE) 10 MG tablet, Take by mouth., Disp: , Rfl:   Allergies Clindamycin/lincomycin  Review of Systems Review of Systems - Oncology ROS as per HPI otherwise 12 point ROS is negative.   Physical Exam  Vitals Wt Readings from Last 3 Encounters:  12/30/17 167 lb 3.2 oz (75.8 kg)  05/30/17 172 lb 14.4 oz (78.4 kg)  05/06/17 172 lb 9.6 oz (78.3 kg)   Temp Readings from Last 3 Encounters:  12/30/17 98.4 F (36.9 C) (Oral)  05/30/17 98.5 F (36.9 C) (Oral)  05/06/17 97.7 F (36.5 C) (Oral)   BP Readings from Last 3 Encounters:  12/30/17 (!) 90/56  05/30/17 114/72  05/06/17 (!) 100/59   Pulse Readings from Last 3 Encounters:  12/30/17 73  05/30/17 65  05/06/17 63    Constitutional: Well-developed, well-nourished, and in no distress.   HENT: Head: Normocephalic and atraumatic.  Mouth/Throat: No oropharyngeal exudate. Mucosa moist. Eyes: Pupils are equal, round, and reactive to light. Conjunctivae are normal. No scleral icterus.  Neck: Normal range of motion. Neck supple. No JVD present.  Cardiovascular: Normal rate, regular rhythm and normal heart sounds.  Exam reveals no gallop and no friction rub.   No murmur  heard. Pulmonary/Chest: Effort normal and breath sounds normal. No respiratory distress. No wheezes.No rales.  Abdominal: Soft. Bowel sounds are normal. No distension. There is no tenderness. There is no guarding.  Musculoskeletal: No edema or tenderness.  Lymphadenopathy: No cervical, axillary or supraclavicular adenopathy.  Neurological: Alert and oriented to person, place, and time. No cranial nerve deficit.  Skin: Skin is warm and dry. No rash noted. No erythema. No pallor.  Psychiatric: Affect and judgment normal.   Labs No visits with results within 3 Day(s) from this visit.  Latest known visit with results is:  Appointment on 11/29/2017  Component Date Value Ref Range Status  . WBC 11/29/2017 5.7  4.0 - 10.5 K/uL Final  . RBC 11/29/2017 4.70  4.22 - 5.81 MIL/uL Final  . Hemoglobin 11/29/2017 13.5  13.0 - 17.0 g/dL Final  . HCT 11/29/2017 42.5  39.0 - 52.0 % Final  . MCV 11/29/2017 90.4  78.0 - 100.0 fL Final  . MCH 11/29/2017 28.7  26.0 - 34.0 pg Final  . MCHC 11/29/2017 31.8  30.0 - 36.0 g/dL Final  . RDW 11/29/2017 15.0  11.5 - 15.5 % Final  . Platelets 11/29/2017 199  150 - 400 K/uL Final  . Neutrophils Relative % 11/29/2017 48  % Final  . Neutro Abs 11/29/2017 2.8  1.7 - 7.7 K/uL Final  . Lymphocytes Relative 11/29/2017 35  % Final  . Lymphs Abs 11/29/2017 2.0  0.7 - 4.0 K/uL Final  . Monocytes Relative 11/29/2017 15  % Final  . Monocytes Absolute 11/29/2017 0.9  0.1 - 1.0 K/uL Final  . Eosinophils Relative 11/29/2017 2  % Final  . Eosinophils Absolute 11/29/2017 0.1  0.0 - 0.7 K/uL Final  . Basophils Relative 11/29/2017 0  % Final  . Basophils Absolute 11/29/2017 0.0  0.0 - 0.1 K/uL Final   Performed at [facility], [address] [place], [place] [address]  . Sodium 11/29/2017 140  135 - 145 mmol/L Final  . Potassium 11/29/2017 4.2  3.5 - 5.1 mmol/L Final  . Chloride 11/29/2017 102  101 - 111 mmol/L Final  . CO2 11/29/2017 30  22 - 32 mmol/L Final  .  Glucose, Bld  11/29/2017 97  65 - 99 mg/dL Final  . BUN 11/29/2017 14  6 - 20 mg/dL Final  . Creatinine, Ser 11/29/2017 1.14  0.61 - 1.24 mg/dL Final  . Calcium 11/29/2017 9.3  8.9 - 10.3 mg/dL Final  . Total Protein 11/29/2017 9.2* 6.5 - 8.1 g/dL Final  . Albumin 11/29/2017 3.7  3.5 - 5.0 g/dL Final  . AST 11/29/2017 21  15 - 41 U/L Final  . ALT 11/29/2017 15* 17 - 63 U/L Final  . Alkaline Phosphatase 11/29/2017 53  38 - 126 U/L Final  . Total Bilirubin 11/29/2017 0.5  0.3 - 1.2 mg/dL Final  . GFR calc non Af Amer 11/29/2017 >60  >60 mL/min Final  . GFR calc Af Amer 11/29/2017 >60  >60 mL/min Final   Comment: (NOTE) The eGFR has been calculated using the CKD EPI equation. This calculation has not been validated in all clinical situations. eGFR's persistently <60 mL/min signify possible Chronic Kidney Disease.   Georgiann Hahn gap 11/29/2017 8  5 - 15 Final   Performed at [facility], [address] [place], [place] [address]  . LDH 11/29/2017 140  98 - 192 U/L Final   Performed at [facility], [address], [place], [place] [address]     Pathology Orders Placed This Encounter  Procedures  . DG Bone Survey Met    Standing Status:   Future    Standing Expiration Date:   03/02/2019    Order Specific Question:   Reason for Exam (SYMPTOM  OR DIAGNOSIS REQUIRED)    Answer:   bone lesions noted on CT    Order Specific Question:   Preferred imaging location?    Answer:   [facility]    Order Specific Question:   Radiology Contrast Protocol - do NOT remove file path    Answer:   \\[email or web address]  . CT Chest W Contrast    Standing Status:   Future    Standing Expiration Date:   12/30/2018    Order Specific Question:   If indicated for the ordered procedure, I authorize the administration of contrast media per Radiology protocol    Answer:   Yes    Order Specific Question:   Preferred imaging location?    Answer:   [facility]    Order Specific  Question:   Radiology Contrast Protocol - do NOT remove file path    Answer:   \\[email or web address]  . CT Abdomen Pelvis W Contrast    Standing Status:   Future    Standing Expiration Date:   12/30/2018    Order Specific Question:   If indicated for the ordered procedure, I authorize the administration of contrast media per Radiology protocol    Answer:   Yes    Order Specific Question:   Preferred imaging location?    Answer:   [facility]    Order Specific Question:   Is Oral Contrast requested for this exam?    Answer:   Yes, Per Radiology protocol    Order Specific Question:   Radiology Contrast Protocol - do NOT remove file path    Answer:   \\[email or web address]  . CBC with Differential/Platelet    Standing Status:   Future    Standing Expiration Date:   12/31/2018  . Comprehensive metabolic panel    Standing Status:   Future    Standing Expiration Date:   12/31/2018  . Lactate dehydrogenase    Standing Status:  Future    Standing Expiration Date:   12/31/2018  . Protein electrophoresis, serum    Standing Status:   Future    Standing Expiration Date:   12/31/2018  . Kappa/Lambda Light Chains, Free, With Ratio, 24Hr. Urine    Standing Status:   Future    Standing Expiration Date:   12/31/2018  . IgG, IgA, IgM    Standing Status:   Future    Standing Expiration Date:   12/31/2018  . Sedimentation rate    Standing Status:   Future    Standing Expiration Date:   12/31/2018       Zoila Shutter MD

## 2018-01-09 ENCOUNTER — Encounter: Payer: Self-pay | Admitting: Gastroenterology

## 2018-03-10 IMAGING — CT CT ABD-PELV W/ CM
3 of 5 series · 15 of 36 positions shown, 18 images · IV contrast (Isovue)
Comparison: 01/05/2016

CLINICAL DATA: Followup Hodgkin's lymphoma. Status post
chemotherapy and radiation therapy. Restaging.

EXAM:
CT CHEST, ABDOMEN, AND PELVIS WITH CONTRAST
TECHNIQUE: Multidetector CT imaging of the chest, abdomen and pelvis was
performed following the standard protocol during bolus
administration of intravenous contrast.
CONTRAST:  100mL OIKFNS-TZZ IOPAMIDOL (OIKFNS-TZZ) INJECTION 61%

[Series 2: cap with · axial · 0.79mm/px · z∈[+834,+1368]mm · 10 of 131 slices shown, 13 images]
[im 12/131  mediastinal]
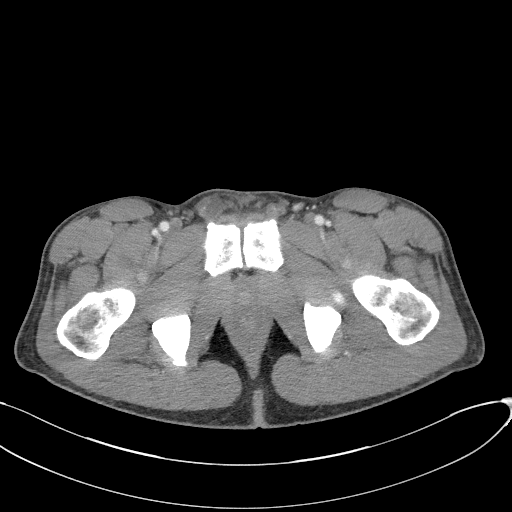
[im 12/131  lung]
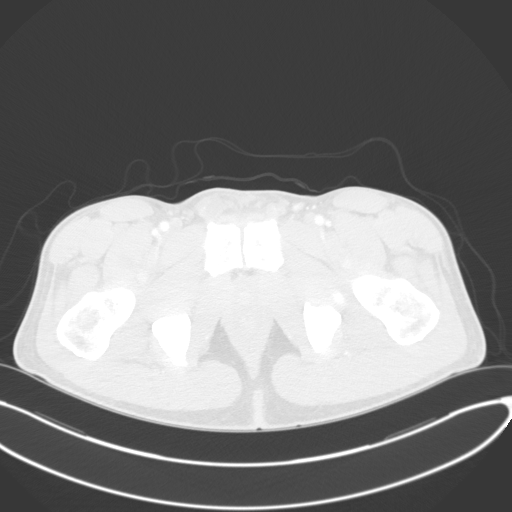
[im 24/131  lung]
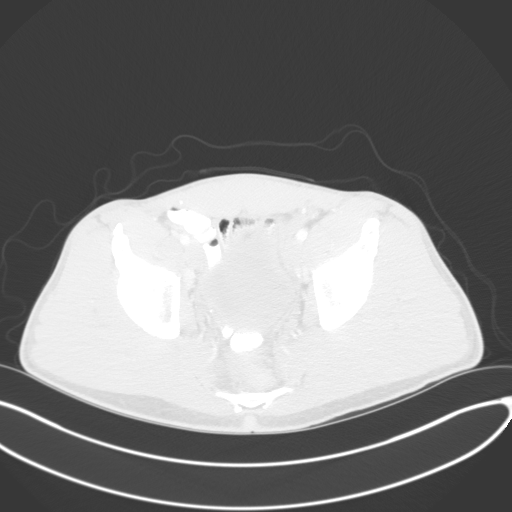
[im 36/131  lung]
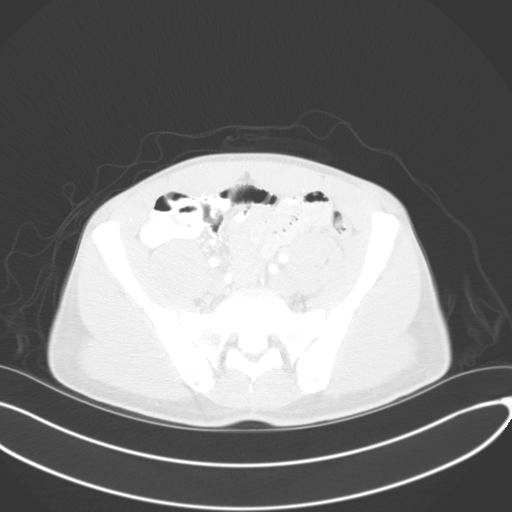
[im 48/131  lung]
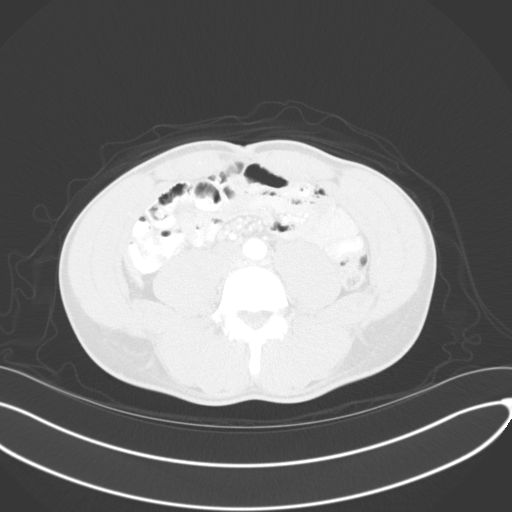
[im 60/131  mediastinal]
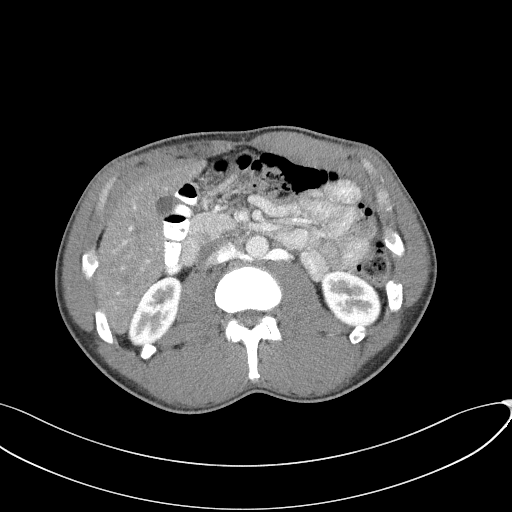
[im 60/131  lung]
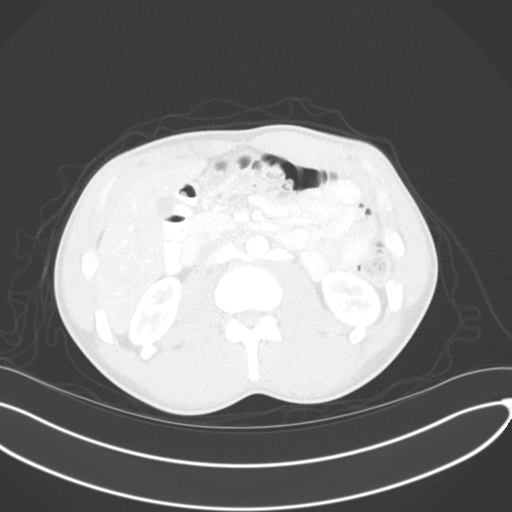
[im 71/131  lung]
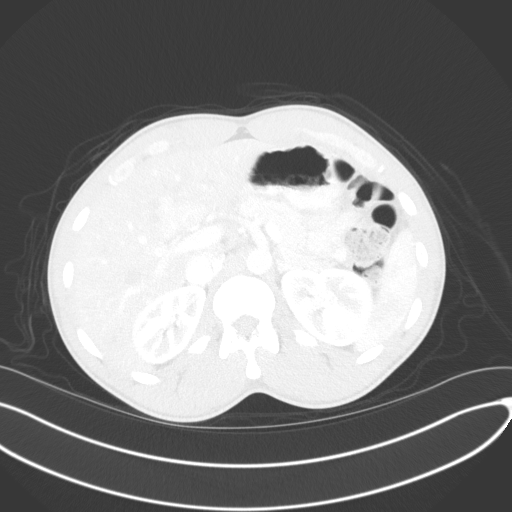
[im 83/131  lung]
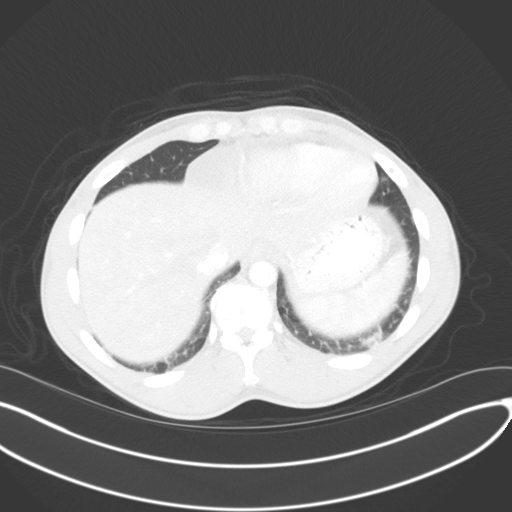
[im 95/131  lung]
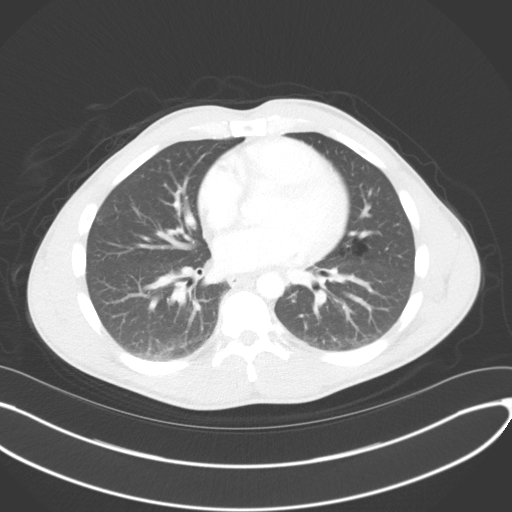
[im 107/131  mediastinal]
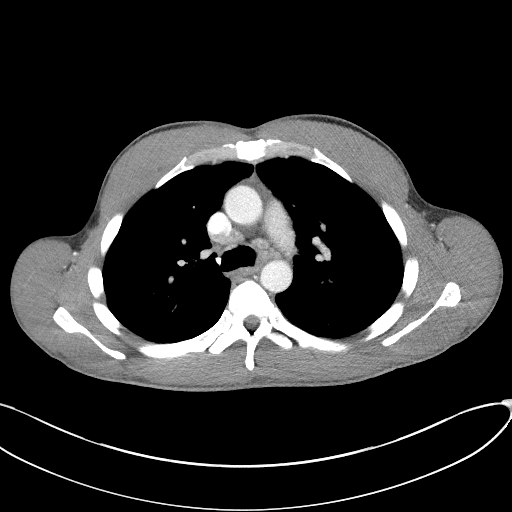
[im 107/131  lung]
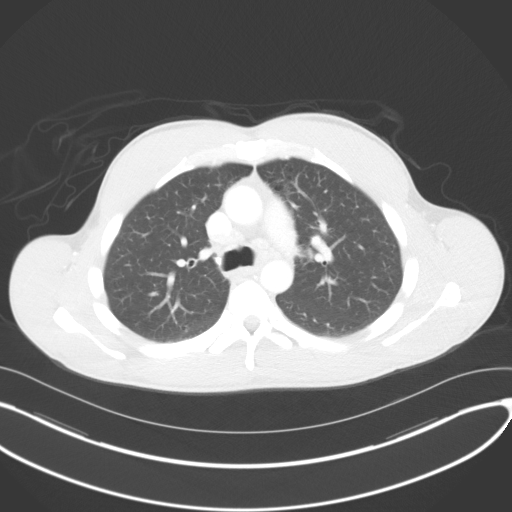
[im 119/131  lung]
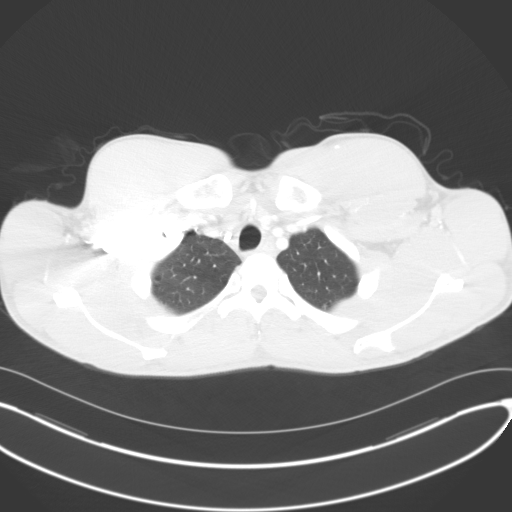

[Series 4: lung · axial · 0.79mm/px · z∈[+1184,+1208]mm · 2 of 135 slices shown]
[im 13/135  lung]
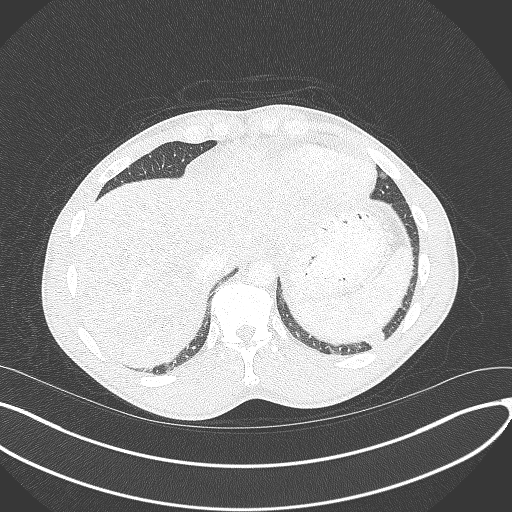
[im 25/135  lung]
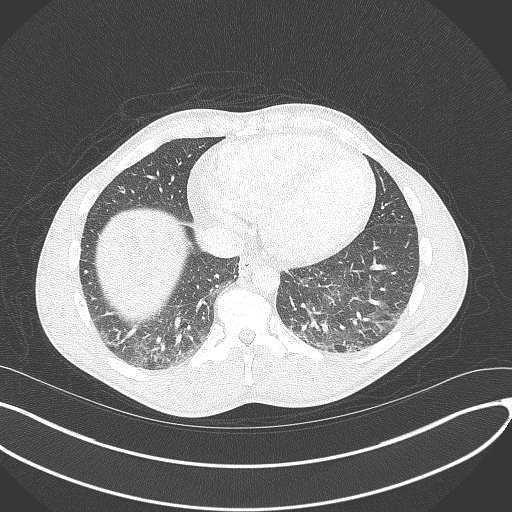

[Series 5: coronals · coronal · 0.95mm/px · 3 of 146 slices shown]
[im 30/146  lung]
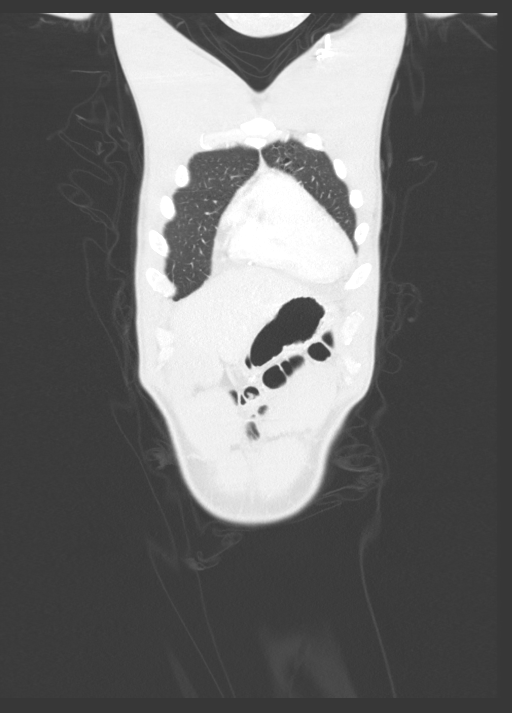
[im 59/146  lung]
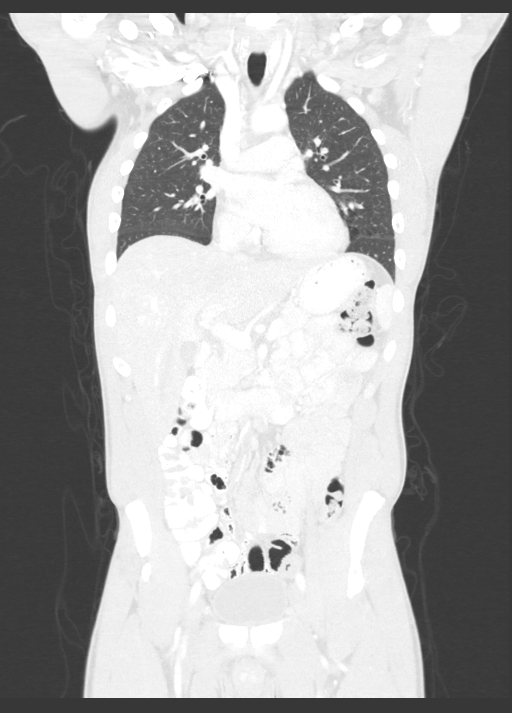
[im 88/146  lung]
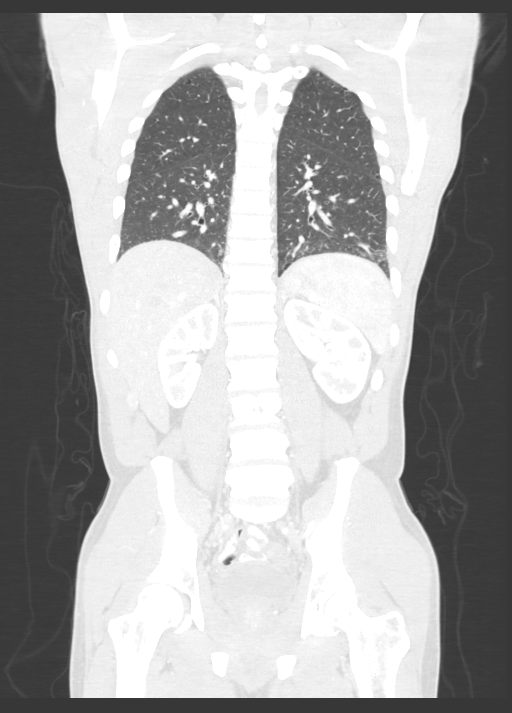

[15 of 36 positions shown; findings below may reference images not displayed]

FINDINGS: CT CHEST FINDINGS

Cardiovascular: No acute findings. Left-sided Port-A-Cath remains in
appropriate position.

Mediastinum/Lymph Nodes: No masses or pathologically enlarged lymph
nodes identified.

Lungs/Pleura: No pulmonary consolidation or mass identified. No
effusion present. Mild paraseptal emphysema. Mild bilateral
dependent atelectasis.

Musculoskeletal:  No suspicious bone lesions identified.

CT ABDOMEN AND PELVIS FINDINGS

Hepatobiliary: No masses identified. Mild hepatic steatosis again
noted with focal fatty sparing in segment 4B of the left lobe
adjacent to the porta hepatis. Gallbladder is unremarkable.

Pancreas:  No mass or inflammatory changes.

Spleen:  Within normal limits in size and appearance.

Adrenals/Urinary tract:  No masses or hydronephrosis.

Stomach/Bowel: No evidence of obstruction, inflammatory process, or
abnormal fluid collections.

Vascular/Lymphatic: No pathologically enlarged lymph nodes
identified. No abdominal aortic aneurysm.

Reproductive:  No mass or other significant abnormality identified.

Other:  None.

Musculoskeletal: Several small lucent bone lesions involving both
iliac bones in the pelvis show no significant change. These were
non-FDG avid on previous PET. No new or progressive bone lesions
identified.
IMPRESSION: Stable exam. No evidence of recurrent lymphoma or other acute
findings.

## 2018-03-30 ENCOUNTER — Ambulatory Visit (INDEPENDENT_AMBULATORY_CARE_PROVIDER_SITE_OTHER): Payer: Medicaid Other | Admitting: Gastroenterology

## 2018-03-30 ENCOUNTER — Encounter: Payer: Self-pay | Admitting: Gastroenterology

## 2018-03-30 VITALS — BP 109/71 | HR 85 | Temp 99.2°F | Ht 69.0 in | Wt 166.8 lb

## 2018-03-30 DIAGNOSIS — R630 Anorexia: Secondary | ICD-10-CM

## 2018-03-30 DIAGNOSIS — R1013 Epigastric pain: Secondary | ICD-10-CM

## 2018-03-30 DIAGNOSIS — R634 Abnormal weight loss: Secondary | ICD-10-CM | POA: Insufficient documentation

## 2018-03-30 MED ORDER — PANTOPRAZOLE SODIUM 40 MG PO TBEC: 40.0000 mg | DELAYED_RELEASE_TABLET | Freq: Every day | ORAL | 3 refills | Status: DC

## 2018-03-30 NOTE — Progress Notes (Addendum)
Primary Care Physician:  Patient, No Pcp Per Referring MD: Dr. Vetta Higgs Primary Gastroenterologist:  Sandi Fields, MD REVIEWED-NO ADDITIONAL RECOMMENDATIONS.  Chief Complaint  Patient presents with  . not able to gain weight  . Abdominal Pain    HPI:  Robert Acevedo is a 47 y.o. male here at the request of Dr. Vetta Higgs for further evaluation of anorexia and weight loss. He has h/o Stage III Hodgkin lymphoma diagnosed in 2016 s/p chemo and in remission. 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 He is being monitored by oncology regularly.   Patient reports weighing around 185 190 prior to getting sick with lymphoma.  We have a documented weight of 189 in March 2016.  He dropped down into the 160 range with treatment but eventually regained some of his weight basically staying in the 170 range.  October 2018 he weighed 172.  In May he weighed 167 and his current rate is 166.  He has issues with his appetite.  Some days it is very poor, some days he feels very hungry.  He goes anywhere from having early satiety to feeling like he cannot get enough to eat to feel himself up.  He has some postprandial epigastric discomfort but no vomiting.  He feels like he does not tolerate dairy very well.  He has tried to eliminate eggs and cheese most recently, had already cut out ice cream and milk long time ago.  He typically will tolerate [employer] but will feel very hungry an hour later.  Can eat about a 6 inch sub-and that is it.  Eats only about 1 meal a day because he is just not hungry.  Denies heartburn.  He does have some indigestion at times.  Has used Gas-X with relief.  Bowel movement every other day or so.  Less frequent since he has had diminished intake.  No melena or rectal bleeding.  He is trying to cut back on cigarettes, down to 1 pack/week.  Very limited alcohol consumption.  He has been very worried about his symptoms and skipped a [place]  trip to keep this appointment.  No current outpatient medications on file.   No current facility-administered medications for this visit.     Allergies as of 03/30/2018 - Review Complete 03/30/2018  Allergen Reaction Noted  . Clindamycin/lincomycin Rash 11/04/2014    Past Medical History:  Diagnosis Date  . Bronchitis   . Hodgkin's lymphoma (HCC)   . Lymphadenopathy, inguinal    left    Past Surgical History:  Procedure Laterality Date  . BONE MARROW ASPIRATION Left 01/20/15  . BONE MARROW BIOPSY Left 01/20/15  . INCISION AND DRAINAGE PERITONSILLAR ABSCESS    . KNEE SURGERY    . LYMPH NODE BIOPSY Left 01/13/2015   Procedure: LEFT INGUINAL LYMPH NODE BIOPSY;  Surgeon: Mark Jenkins Md, MD;  Location: [facility];  Service: General;  Laterality: Left;  . PORT-A-CATH REMOVAL Left 09/08/2016   Procedure: MINOR REMOVAL PORT-A-CATH;  Surgeon: Mark Jenkins, MD;  Location: [facility];  Service: General;  Laterality: Left;  . PORTACATH PLACEMENT Left 01/31/2015   Procedure: INSERTION PORT-A-CATH;  Surgeon: Mark Jenkins Md, MD;  Location: [facility];  Service: General;  Laterality: Left;  . WRIST SURGERY     right    Family History  Problem Relation Age of Onset  . Seizures Mother   . Diabetes Father     Social History   Socioeconomic History  .   Marital status: Single    Spouse name: Not on file  . Number of children: Not on file  . Years of education: Not on file  . Highest education level: Not on file  Occupational History  . Not on file  Social Needs  . Financial resource strain: Not on file  . Food insecurity:    Worry: Not on file    Inability: Not on file  . Transportation needs:    Medical: Not on file    Non-medical: Not on file  Tobacco Use  . Smoking status: Current Every Day Smoker    Packs/day: 0.50    Years: 2.00    Pack years: 1.00    Types: Cigarettes  . Smokeless tobacco: Never Used  Substance and Sexual Activity  . Alcohol use: Yes    Comment: occasional  .  Drug use: Yes    Types: Marijuana    Comment: daily  . Sexual activity: Yes    Birth control/protection: None  Lifestyle  . Physical activity:    Days per week: Not on file    Minutes per session: Not on file  . Stress: Not on file  Relationships  . Social connections:    Talks on phone: Not on file    Gets together: Not on file    Attends religious service: Not on file    Active member of club or organization: Not on file    Attends meetings of clubs or organizations: Not on file    Relationship status: Not on file  . Intimate partner violence:    Fear of current or ex partner: Not on file    Emotionally abused: Not on file    Physically abused: Not on file    Forced sexual activity: Not on file  Other Topics Concern  . Not on file  Social History Narrative  . Not on file      ROS:  General: Negative for fever, chills, fatigue, weakness.  See HPI Eyes: Negative for vision changes.  ENT: Negative for hoarseness, difficulty swallowing , nasal congestion. CV: Negative for chest pain, angina, palpitations, dyspnea on exertion, peripheral edema.  Respiratory: Negative for dyspnea at rest, dyspnea on exertion, cough, sputum, wheezing.  GI: See history of present illness. GU:  Negative for dysuria, hematuria, urinary incontinence, urinary frequency, nocturnal urination.  MS: Negative for joint pain, low back pain.  Derm: Negative for rash or itching.  Neuro: Negative for weakness, abnormal sensation, seizure, frequent headaches, memory loss, confusion.  Psych: Negative for anxiety, depression, suicidal ideation, hallucinations.  Endo: See HPI Heme: Negative for bruising or bleeding. Allergy: Negative for rash or hives.    Physical Examination:  BP 109/71   Pulse 85   Temp 99.2 F (37.3 C) (Other (Comment))   Ht 5' 9" (1.753 m)   Wt 166 lb 12.8 oz (75.7 kg)   BMI 24.63 kg/m    General: Well-nourished, well-developed in no acute distress.  Head: Normocephalic,  atraumatic.   Eyes: Conjunctiva pink, no icterus. Mouth: Oropharyngeal mucosa moist and pink , no lesions erythema or exudate. Neck: Supple without thyromegaly, masses, or lymphadenopathy.  Lungs: Clear to auscultation bilaterally.  Heart: Regular rate and rhythm, no murmurs rubs or gallops.  Abdomen: Bowel sounds are normal, nontender, nondistended, no hepatosplenomegaly or masses, no abdominal bruits or    hernia , no rebound or guarding.   Rectal: Not performed Extremities: No lower extremity edema. No clubbing or deformities.  Neuro: Alert and oriented x 4 ,   grossly normal neurologically.  Skin: Warm and dry, no rash or jaundice.   Psych: Alert and cooperative, normal mood and affect.  Labs: Lab Results  Component Value Date   WBC 5.7 11/29/2017   HGB 13.5 11/29/2017   HCT 42.5 11/29/2017   MCV 90.4 11/29/2017   PLT 199 11/29/2017   Lab Results  Component Value Date   CREATININE 1.14 11/29/2017   BUN 14 11/29/2017   NA 140 11/29/2017   K 4.2 11/29/2017   CL 102 11/29/2017   CO2 30 11/29/2017   Lab Results  Component Value Date   ALT 15 (L) 11/29/2017   AST 21 11/29/2017   ALKPHOS 53 11/29/2017   BILITOT 0.5 11/29/2017     Imaging Studies: No results found.   

## 2018-03-30 NOTE — Patient Instructions (Signed)
Please have your labs done.   You should consider upper endoscopy to evaluate your symptoms and at some point in the near future a colonoscopy for colon cancer screening as well. We will touch base after labs.  Start pantoprazole once daily before first meal of the day.

## 2018-03-30 NOTE — Assessment & Plan Note (Signed)
47 year old gentleman with history of stage III Hodgkin's lymphoma status post chemotherapy back in 2016 who presents with complaints of being unable to gain weight/weight loss, poor appetite as well as some postprandial abdominal discomfort. Last CT imaging in April of this year without findings to explain the symptoms.  Denies prior colonoscopy or EGD.  His symptoms are quite interesting describing early satiety and at times feeling completely famished even after large meals.  He has postprandial abdominal pain.  Dairy intolerance.  Bowel function unremarkable.  Initially we will check labs including TSH, H. pylori serologies.  He was somewhat hesitant to pursue upper endoscopy, relating that he feels like his appetite is poor because he is grieving the loss of his girlfriend, she walked away from the relationship when he was diagnosed in cancer in 2016 after being together for 11 years.  When she calls him periodically he loses his appetite.  He was tearful with today as discussing this.  I explained to him that his emotions could definitely affect his appetite but we need to make sure there is nothing underlying as well.  He is also of age for screening colonoscopy, would consider that the near future as well.  But for now per his request we will obtain labs and go from there.  Empirically started pantoprazole 40 mg daily as well.

## 2018-03-31 NOTE — Progress Notes (Signed)
No pcp per patient 

## 2018-04-05 ENCOUNTER — Other Ambulatory Visit (HOSPITAL_COMMUNITY)
Admission: RE | Admit: 2018-04-05 | Discharge: 2018-04-05 | Disposition: A | Discharge status: Home / Self Care | Payer: Self-pay | Source: Ambulatory Visit | Attending: Gastroenterology | Admitting: Gastroenterology

## 2018-04-05 ENCOUNTER — Other Ambulatory Visit (HOSPITAL_COMMUNITY): Payer: Self-pay

## 2018-04-05 DIAGNOSIS — R634 Abnormal weight loss: Secondary | ICD-10-CM | POA: Insufficient documentation

## 2018-04-05 DIAGNOSIS — M899 Disorder of bone, unspecified: Secondary | ICD-10-CM

## 2018-04-05 DIAGNOSIS — C8170 Other classical Hodgkin lymphoma, unspecified site: Secondary | ICD-10-CM

## 2018-04-05 DIAGNOSIS — R1013 Epigastric pain: Secondary | ICD-10-CM | POA: Insufficient documentation

## 2018-04-05 DIAGNOSIS — R630 Anorexia: Secondary | ICD-10-CM | POA: Insufficient documentation

## 2018-04-05 LAB — TSH: TSH: 0.341 u[IU]/mL — ABNORMAL LOW (ref 0.350–4.500)

## 2018-04-06 LAB — H. PYLORI ANTIBODY, IGG: H Pylori IgG: <0.8 Index Value (ref 0.00–0.79)

## 2018-04-17 ENCOUNTER — Other Ambulatory Visit: Payer: Self-pay

## 2018-04-17 DIAGNOSIS — R634 Abnormal weight loss: Secondary | ICD-10-CM

## 2018-04-17 DIAGNOSIS — Z8571 Personal history of Hodgkin lymphoma: Secondary | ICD-10-CM

## 2018-04-17 DIAGNOSIS — Z1211 Encounter for screening for malignant neoplasm of colon: Secondary | ICD-10-CM

## 2018-04-17 DIAGNOSIS — R630 Anorexia: Secondary | ICD-10-CM

## 2018-04-17 DIAGNOSIS — R7989 Other specified abnormal findings of blood chemistry: Secondary | ICD-10-CM

## 2018-04-17 DIAGNOSIS — F5000 Anorexia nervosa, unspecified: Secondary | ICD-10-CM

## 2018-04-17 NOTE — Progress Notes (Signed)
Pt is aware of results and plan. He said his appetite is not much better. He is taking the Protonix daily.  He ate pizza a day or so ago and had vomiting. I told him to stay away from spicy foods.  I am mailing his lab orders to do around 05/01/2018. He said OK to schedule the EGD and colonoscopy. Forwarding to [facility] to schedule.

## 2018-06-13 ENCOUNTER — Telehealth: Payer: Self-pay

## 2018-06-13 NOTE — Telephone Encounter (Signed)
Tried to call pt, he hasn't picked up sample prep and instructions. Procedure is scheduled for 06/16/18 (see lab result note). No answer, LMOVM to remind pt to pick up prep.  Pt called office back. Reminded him to pick up sample prep/instructions. Informed him he will start clear liquids after lunch 06/15/18. He works 10 hour shifts and will have his dad pick up prep/instructions.

## 2018-06-16 ENCOUNTER — Ambulatory Visit (HOSPITAL_COMMUNITY)
Admission: RE | Admit: 2018-06-16 | Discharge: 2018-06-16 | Disposition: A | Discharge status: Home / Self Care | Payer: Self-pay | Source: Ambulatory Visit | Attending: Gastroenterology | Admitting: Gastroenterology

## 2018-06-16 ENCOUNTER — Encounter (HOSPITAL_COMMUNITY)
Admission: RE | Disposition: A | Discharge status: Home / Self Care | Payer: Self-pay | Source: Ambulatory Visit | Attending: Gastroenterology

## 2018-06-16 ENCOUNTER — Encounter (HOSPITAL_COMMUNITY): Payer: Self-pay

## 2018-06-16 ENCOUNTER — Other Ambulatory Visit: Payer: Self-pay

## 2018-06-16 DIAGNOSIS — K319 Disease of stomach and duodenum, unspecified: Secondary | ICD-10-CM | POA: Insufficient documentation

## 2018-06-16 DIAGNOSIS — Q438 Other specified congenital malformations of intestine: Secondary | ICD-10-CM | POA: Insufficient documentation

## 2018-06-16 DIAGNOSIS — K2970 Gastritis, unspecified, without bleeding: Secondary | ICD-10-CM

## 2018-06-16 DIAGNOSIS — K648 Other hemorrhoids: Secondary | ICD-10-CM | POA: Insufficient documentation

## 2018-06-16 DIAGNOSIS — F5000 Anorexia nervosa, unspecified: Secondary | ICD-10-CM

## 2018-06-16 DIAGNOSIS — Z79899 Other long term (current) drug therapy: Secondary | ICD-10-CM | POA: Insufficient documentation

## 2018-06-16 DIAGNOSIS — K2980 Duodenitis without bleeding: Secondary | ICD-10-CM | POA: Insufficient documentation

## 2018-06-16 DIAGNOSIS — K2950 Unspecified chronic gastritis without bleeding: Secondary | ICD-10-CM | POA: Insufficient documentation

## 2018-06-16 DIAGNOSIS — R630 Anorexia: Secondary | ICD-10-CM | POA: Insufficient documentation

## 2018-06-16 DIAGNOSIS — F17210 Nicotine dependence, cigarettes, uncomplicated: Secondary | ICD-10-CM | POA: Insufficient documentation

## 2018-06-16 DIAGNOSIS — Z8571 Personal history of Hodgkin lymphoma: Secondary | ICD-10-CM | POA: Insufficient documentation

## 2018-06-16 DIAGNOSIS — Z1211 Encounter for screening for malignant neoplasm of colon: Secondary | ICD-10-CM | POA: Insufficient documentation

## 2018-06-16 DIAGNOSIS — R634 Abnormal weight loss: Secondary | ICD-10-CM | POA: Insufficient documentation

## 2018-06-16 HISTORY — PX: ESOPHAGOGASTRODUODENOSCOPY: SHX5428

## 2018-06-16 HISTORY — PX: COLONOSCOPY: SHX5424

## 2018-06-16 SURGERY — COLONOSCOPY
Anesthesia: Moderate Sedation

## 2018-06-16 MED ORDER — ATROPINE SULFATE 1 MG/ML IJ SOLN
INTRAMUSCULAR | Status: DC | PRN
  Administered 2018-06-16: .5 mg via INTRAVENOUS

## 2018-06-16 MED ORDER — ATROPINE SULFATE 1 MG/ML IJ SOLN
INTRAMUSCULAR | Status: AC
  Filled 2018-06-16: qty 1

## 2018-06-16 MED ORDER — LIDOCAINE VISCOUS HCL 2 % MT SOLN
OROMUCOSAL | Status: DC | PRN
  Administered 2018-06-16: 1 via OROMUCOSAL

## 2018-06-16 MED ORDER — MEPERIDINE HCL 100 MG/ML IJ SOLN
INTRAMUSCULAR | Status: AC
  Filled 2018-06-16: qty 2

## 2018-06-16 MED ORDER — MIDAZOLAM HCL 5 MG/5ML IJ SOLN
INTRAMUSCULAR | Status: AC
  Filled 2018-06-16: qty 10

## 2018-06-16 MED ORDER — MIDAZOLAM HCL 5 MG/5ML IJ SOLN
INTRAMUSCULAR | Status: DC | PRN
  Administered 2018-06-16: 2 mg via INTRAVENOUS
  Administered 2018-06-16 (×3): 1 mg via INTRAVENOUS

## 2018-06-16 MED ORDER — SODIUM CHLORIDE 0.9 % IV SOLN
INTRAVENOUS | Status: DC
  Administered 2018-06-16: 13:00:00 via INTRAVENOUS

## 2018-06-16 MED ORDER — LIDOCAINE VISCOUS HCL 2 % MT SOLN
OROMUCOSAL | Status: AC
  Filled 2018-06-16: qty 15

## 2018-06-16 MED ORDER — MEPERIDINE HCL 100 MG/ML IJ SOLN
INTRAMUSCULAR | Status: DC | PRN
  Administered 2018-06-16 (×2): 25 mg via INTRAVENOUS

## 2018-06-16 MED ORDER — STERILE WATER FOR IRRIGATION IR SOLN
Status: DC | PRN
  Administered 2018-06-16: 1.5 mL

## 2018-06-16 NOTE — Op Note (Signed)
[facility] Patient Name: Robert Acevedo Procedure Date: 06/16/2018 1:26 PM MRN: [id number] Date of Birth: 1971/01/24 Attending MD: Barney Drain MD, MD CSN: [id number] Age: 47 Admit Type: Outpatient Procedure:                Colonoscopy, SCREENING Indications:              Screening for colorectal malignant neoplasm Providers:                Barney Drain MD, MD, Lurline Del, RN, Randa Spike, Technician Referring MD:             Marlynn Perking Medicines:                Meperidine 25 mg IV, Midazolam 4 mg IV, Atropine                            0.5 mg IV Complications:            No immediate complications. Estimated Blood Loss:     Estimated blood loss: none. Procedure:                Pre-Anesthesia Assessment:                           - Prior to the procedure, a History and Physical                            was performed, and patient medications and                            allergies were reviewed. The patient's tolerance of                            previous anesthesia was also reviewed. The risks                            and benefits of the procedure and the sedation                            options and risks were discussed with the patient.                            All questions were answered, and informed consent                            was obtained. Prior Anticoagulants: The patient has                            taken no previous anticoagulant or antiplatelet                            agents. ASA Grade Assessment: I - A normal, healthy  patient. After reviewing the risks and benefits,                            the patient was deemed in satisfactory condition to                            undergo the procedure. After obtaining informed                            consent, the colonoscope was passed under direct                            vision. Throughout the procedure, the patient's    blood pressure, pulse, and oxygen saturations were                            monitored continuously. The CF-[id number] ([id number])                            scope was introduced through the anus and advanced                            to the the cecum, identified by appendiceal orifice                            and ileocecal valve. The colonoscopy was somewhat                            difficult due to a tortuous colon. Successful                            completion of the procedure was aided by                            straightening and shortening the scope to obtain                            bowel loop reduction and COLOWRAP. The patient                            tolerated the procedure fairly well. The quality of                            the bowel preparation was excellent. The ileocecal                            valve, appendiceal orifice, and rectum were                            photographed. Scope In: 1:54:15 PM Scope Out: 2:10:38 PM Scope Withdrawal Time: 0 hours 12 minutes 55 seconds  Total Procedure Duration: 0 hours 16 minutes 23 seconds  Findings:      The recto-sigmoid colon and sigmoid colon were mildly redundant.  The exam was otherwise without abnormality.      Internal hemorrhoids were found. The hemorrhoids were small. Impression:               - Redundant colon.                           - The examination was otherwise normal.                           - Internal hemorrhoids. Moderate Sedation:      Moderate (conscious) sedation was administered by the endoscopy nurse       and supervised by the endoscopist. The following parameters were       monitored: oxygen saturation, heart rate, blood pressure, and response       to care. Total physician intraservice time was 38 minutes. Recommendation:           - Patient has a contact number available for                            emergencies. The signs and symptoms of potential                             delayed complications were discussed with the                            patient. Return to normal activities tomorrow.                            Written discharge instructions were provided to the                            patient.                           - High fiber diet.                           - Continue present medications.                           - Repeat colonoscopy in 10 years for surveillance.                           - Return to my office in 4 months. Procedure Code(s):        --- Professional ---                           [phone], Colonoscopy, flexible; diagnostic, including                            collection of specimen(s) by brushing or washing,                            when performed (separate procedure)  05397, Moderate sedation; each additional 15                            minutes intraservice time                           99153, Moderate sedation; each additional 15                            minutes intraservice time                           G0500, Moderate sedation services provided by the                            same physician or other qualified health care                            professional performing a gastrointestinal                            endoscopic service that sedation supports,                            requiring the presence of an independent trained                            observer to assist in the monitoring of the                            patient's level of consciousness and physiological                            status; initial 15 minutes of intra-service time;                            patient age 14 years or older (additional time may                            be reported with [id number], as appropriate) Diagnosis Code(s):        --- Professional ---                           Z12.11, Encounter for screening for malignant                            neoplasm of colon                           K64.8,  Other hemorrhoids                           Q43.8, Other specified congenital malformations of                            intestine CPT copyright 2018  American Medical Association. All rights reserved. The codes documented in this report are preliminary and upon coder review may  be revised to meet current compliance requirements. Barney Drain, MD Barney Drain MD, MD 06/16/2018 2:39:34 PM This report has been signed electronically. Number of Addenda: 0

## 2018-06-16 NOTE — Discharge Instructions (Signed)
No source for your weight loss was identified. IT IS MOST LIKELY DUE TO YOUR WORK SCHEDULE, THE CHEMO, AND gastritis.  I biopsied your stomach.    DRINK WATER TO KEEP YOUR URINE LIGHT YELLOW.  FOLLOW A HIGH FIBER DIET. AVOID ITEMS THAT CAUSE BLOATING. SEE INFO BELOW.  YOUR BIOPSY RESULTS WILL BE BACK IN 5 BUSINESS DAYS.  FOLLOW UP IN 4 MOS.   Next colonoscopy in 10 years.   ENDOSCOPY Care After Read the instructions outlined below and refer to this sheet in the next week. These discharge instructions provide you with general information on caring for yourself after you leave the hospital. While your treatment has been planned according to the most current medical practices available, unavoidable complications occasionally occur. If you have any problems or questions after discharge, call DR. Casy Brunetto, [phone].  ACTIVITY  You may resume your regular activity, but move at a slower pace for the next 24 hours.   Take frequent rest periods for the next 24 hours.   Walking will help get rid of the air and reduce the bloated feeling in your belly (abdomen).   No driving for 24 hours (because of the medicine (anesthesia) used during the test).   You may shower.   Do not sign any important legal documents or operate any machinery for 24 hours (because of the anesthesia used during the test).    NUTRITION  Drink plenty of fluids.   You may resume your normal diet as instructed by your doctor.   Begin with a light meal and progress to your normal diet. Heavy or fried foods are harder to digest and may make you feel sick to your stomach (nauseated).   Avoid alcoholic beverages for 24 hours or as instructed.    MEDICATIONS  You may resume your normal medications.   WHAT YOU CAN EXPECT TODAY  Some feelings of bloating in the abdomen.   Passage of more gas than usual.   Spotting of blood in your stool or on the toilet paper  .  IF YOU HAD POLYPS REMOVED DURING THE  ENDOSCOPY:  Eat a soft diet IF YOU HAVE NAUSEA, BLOATING, ABDOMINAL PAIN, OR VOMITING.    FINDING OUT THE RESULTS OF YOUR TEST Not all test results are available during your visit. DR. Oneida Alar WILL CALL YOU WITHIN 14 DAYS OF YOUR PROCEDUE WITH YOUR RESULTS. Do not assume everything is normal if you have not heard from DR. Nuri Larmer IN TWO WEEKS, CALL HER OFFICE AT [phone].  SEEK IMMEDIATE MEDICAL ATTENTION AND CALL THE OFFICE: [phone] IF:  You have more than a spotting of blood in your stool.   Your belly is swollen (abdominal distention).   You are nauseated or vomiting.   You have a temperature over 101F.   You have abdominal pain or discomfort that is severe or gets worse throughout the day.   Gastritis  Gastritis is an inflammation (the body's way of reacting to injury and/or infection) of the stomach. It is often caused by viral or bacterial (germ) infections. It can also be caused BY ALCOHOL, ASPIRIN, BC/GOODY POWDER'S, (IBUPROFEN) MOTRIN, OR ALEVE (NAPROXEN), chemicals (including alcohol), SPICY FOODS, and medications. This illness may be associated with generalized malaise (feeling tired, not well), UPPER ABDOMINAL STOMACH cramps, and fever. One common bacterial cause of gastritis is an organism known as H. Pylori. This can be treated with antibiotics.    High-Fiber Diet A high-fiber diet changes your normal diet to include more whole grains, legumes, fruits,  and vegetables. Changes in the diet involve replacing refined carbohydrates with unrefined foods. The calorie level of the diet is essentially unchanged. The Dietary Reference Intake (recommended amount) for adult males is 38 grams per day. For adult females, it is 25 grams per day. Pregnant and lactating women should consume 28 grams of fiber per day. Fiber is the intact part of a plant that is not broken down during digestion. Functional fiber is fiber that has been isolated from the plant to provide a beneficial  effect in the body. PURPOSE  Increase stool bulk.   Ease and regulate bowel movements.   Lower cholesterol.   REDUCE RISK OF COLON CANCER  INDICATIONS THAT YOU NEED MORE FIBER  Constipation and hemorrhoids.   Uncomplicated diverticulosis (intestine condition) and irritable bowel syndrome.   Weight management.   As a protective measure against hardening of the arteries (atherosclerosis), diabetes, and cancer.   GUIDELINES FOR INCREASING FIBER IN THE DIET  Start adding fiber to the diet slowly. A gradual increase of about 5 more grams (2 slices of whole-wheat bread, 2 servings of most fruits or vegetables, or 1 bowl of high-fiber cereal) per day is best. Too rapid an increase in fiber may result in constipation, flatulence, and bloating.   Drink enough water and fluids to keep your urine clear or pale yellow. Water, juice, or caffeine-free drinks are recommended. Not drinking enough fluid may cause constipation.   Eat a variety of high-fiber foods rather than one type of fiber.   Try to increase your intake of fiber through using high-fiber foods rather than fiber pills or supplements that contain small amounts of fiber.   The goal is to change the types of food eaten. Do not supplement your present diet with high-fiber foods, but replace foods in your present diet.   INCLUDE A VARIETY OF FIBER SOURCES  Replace refined and processed grains with whole grains, canned fruits with fresh fruits, and incorporate other fiber sources. White rice, white breads, and most bakery goods contain little or no fiber.   Brown whole-grain rice, buckwheat oats, and many fruits and vegetables are all good sources of fiber. These include: broccoli, Brussels sprouts, cabbage, cauliflower, beets, sweet potatoes, white potatoes (skin on), carrots, tomatoes, eggplant, squash, berries, fresh fruits, and dried fruits.   Cereals appear to be the richest source of fiber. Cereal fiber is found in whole grains  and bran. Bran is the fiber-rich outer coat of cereal grain, which is largely removed in refining. In whole-grain cereals, the bran remains. In breakfast cereals, the largest amount of fiber is found in those with "bran" in their names. The fiber content is sometimes indicated on the label.   You may need to include additional fruits and vegetables each day.   In baking, for 1 cup white flour, you may use the following substitutions:   1 cup whole-wheat flour minus 2 tablespoons.   1/2 cup white flour plus 1/2 cup whole-wheat flour.

## 2018-06-16 NOTE — Op Note (Signed)
[facility] Patient Name: Robert Acevedo Procedure Date: 06/16/2018 2:12 PM MRN: [id number] Date of Birth: 02-Feb-1971 Attending MD: Barney Drain MD, MD CSN: [id number] Age: 47 Admit Type: Outpatient Procedure:                Upper GI endoscopy WITH COLD FORCEPS BIOPSY Indications:              Anorexia, Weight loss Providers:                Barney Drain MD, MD, Lurline Del, RN, Randa Spike, Technician Referring MD:             Marlynn Perking Medicines:                TCS + Meperidine 25 mg IV, Midazolam 1 mg IV Complications:            No immediate complications. Estimated Blood Loss:     Estimated blood loss was minimal. Procedure:                Pre-Anesthesia Assessment:                           - Prior to the procedure, a History and Physical                            was performed, and patient medications and                            allergies were reviewed. The patient's tolerance of                            previous anesthesia was also reviewed. The risks                            and benefits of the procedure and the sedation                            options and risks were discussed with the patient.                            All questions were answered, and informed consent                            was obtained. Prior Anticoagulants: The patient has                            taken no previous anticoagulant or antiplatelet                            agents. ASA Grade Assessment: I - A normal, healthy                            patient. After reviewing the risks and benefits,  the patient was deemed in satisfactory condition to                            undergo the procedure. After obtaining informed                            consent, the endoscope was passed under direct                            vision. Throughout the procedure, the patient's                            blood pressure, pulse, and oxygen  saturations were                            monitored continuously. The GIF-[id number] ([id number])                            scope was introduced through the mouth, and                            advanced to the second part of duodenum. The upper                            GI endoscopy was accomplished without difficulty.                            The patient tolerated the procedure well. Scope In: 2:16:59 PM Scope Out: 2:22:32 PM Total Procedure Duration: 0 hours 5 minutes 33 seconds  Findings:      The examined esophagus was normal.      Diffuse moderate inflammation characterized by congestion (edema),       erosions and erythema was found in the entire examined stomach.       Biopsies(2: ANTRUM, 3: BODY) were taken with a cold forceps for       Helicobacter pylori testing.      Patchy mild inflammation characterized by congestion (edema) and       erythema was found in the duodenal bulb.      The second portion of the duodenum was normal. Impression:               - NO SOURCE FOR WEIGHT LOSS IDENTIFIED                           - MODERATE Gastritis/MILD Duodenitis. Moderate Sedation:      Moderate (conscious) sedation was administered by the endoscopy nurse       and supervised by the endoscopist. The following parameters were       monitored: oxygen saturation, heart rate, blood pressure, and response       to care. Total physician intraservice time was 38 minutes. Recommendation:           - Patient has a contact number available for                            emergencies. The signs and symptoms of potential  delayed complications were discussed with the                            patient. Return to normal activities tomorrow.                            Written discharge instructions were provided to the                            patient.                           - High fiber diet.                           - Continue present medications.                            - Await pathology results.                           - Return to my office in 4 months. Procedure Code(s):        --- Professional ---                           [id number], Esophagogastroduodenoscopy, flexible,                            transoral; with biopsy, single or multiple                           99153, Moderate sedation; each additional 15                            minutes intraservice time                           99153, Moderate sedation; each additional 15                            minutes intraservice time                           G0500, Moderate sedation services provided by the                            same physician or other qualified health care                            professional performing a gastrointestinal                            endoscopic service that sedation supports,                            requiring the presence of an independent trained  observer to assist in the monitoring of the                            patient's level of consciousness and physiological                            status; initial 15 minutes of intra-service time;                            patient age 63 years or older (additional time may                            be reported with [phone], as appropriate) Diagnosis Code(s):        --- Professional ---                           K29.70, Gastritis, unspecified, without bleeding                           K29.80, Duodenitis without bleeding                           R63.0, Anorexia                           R63.4, Abnormal weight loss CPT copyright 2018 [employer] Association. All rights reserved. The codes documented in this report are preliminary and upon coder review may  be revised to meet current compliance requirements. Barney Drain, MD Barney Drain MD, MD 06/16/2018 2:44:07 PM This report has been signed electronically. Number of Addenda: 0

## 2018-06-16 NOTE — H&P (Signed)
Primary Care Physician:  Patient, No Pcp Per Primary Gastroenterologist:  Dr. Oneida Alar  Pre-Procedure History & Physical: HPI:  Robert Acevedo is a 47 y.o. male here for SCREENING/anorexia/WEIGHT LOSS.  Past Medical History:  Diagnosis Date  . Bronchitis   . Hodgkin's lymphoma ([place])   . Lymphadenopathy, inguinal    left    Past Surgical History:  Procedure Laterality Date  . BONE MARROW ASPIRATION Left 01/20/15  . BONE MARROW BIOPSY Left 01/20/15  . INCISION AND DRAINAGE PERITONSILLAR ABSCESS    . KNEE SURGERY    . LYMPH NODE BIOPSY Left 01/13/2015   Procedure: LEFT INGUINAL LYMPH NODE BIOPSY;  Surgeon: Aviva Signs Md, MD;  Location: [facility];  Service: General;  Laterality: Left;  . PORT-A-CATH REMOVAL Left 09/08/2016   Procedure: MINOR REMOVAL PORT-A-CATH;  Surgeon: Aviva Signs, MD;  Location: [facility];  Service: General;  Laterality: Left;  . PORTACATH PLACEMENT Left 01/31/2015   Procedure: INSERTION PORT-A-CATH;  Surgeon: Aviva Signs Md, MD;  Location: [facility];  Service: General;  Laterality: Left;  . WRIST SURGERY     right    Prior to Admission medications   Medication Sig Start Date End Date Taking? Authorizing Provider  Multiple Vitamin (MULTIVITAMIN WITH MINERALS) TABS tablet Take 1 tablet by mouth daily.   Yes [provider]  pantoprazole (PROTONIX) 40 MG tablet Take 1 tablet (40 mg total) by mouth daily before breakfast. Patient not taking: Reported on 06/08/2018 03/30/18   Mahala Menghini, PA-C    Allergies as of 04/17/2018 - Review Complete 03/30/2018  Allergen Reaction Noted  . Clindamycin/lincomycin Rash 11/04/2014    Family History  Problem Relation Age of Onset  . Seizures Mother   . Diabetes Father     Social History   Socioeconomic History  . Marital status: Single    Spouse name: Not on file  . Number of children: Not on file  . Years of education: Not on file  . Highest education level: Not on file  Occupational History  . Not on file   Social Needs  . Financial resource strain: Not on file  . Food insecurity:    Worry: Not on file    Inability: Not on file  . Transportation needs:    Medical: Not on file    Non-medical: Not on file  Tobacco Use  . Smoking status: Current Every Day Smoker    Packs/day: 0.50    Years: 2.00    Pack years: 1.00    Types: Cigarettes  . Smokeless tobacco: Never Used  Substance and Sexual Activity  . Alcohol use: Yes    Comment: occasional  . Drug use: Yes    Types: Marijuana    Comment: daily  . Sexual activity: Yes    Birth control/protection: None  Lifestyle  . Physical activity:    Days per week: Not on file    Minutes per session: Not on file  . Stress: Not on file  Relationships  . Social connections:    Talks on phone: Not on file    Gets together: Not on file    Attends religious service: Not on file    Active member of club or organization: Not on file    Attends meetings of clubs or organizations: Not on file    Relationship status: Not on file  . Intimate partner violence:    Fear of current or ex partner: Not on file    Emotionally abused: Not on file  Physically abused: Not on file    Forced sexual activity: Not on file  Other Topics Concern  . Not on file  Social History Narrative  . Not on file    Review of Systems: See HPI, otherwise negative ROS   Physical Exam: BP 100/61   Pulse (!) 44   Temp 97.6 F (36.4 C) (Oral)   Resp 12   Ht '5\' 9"'  (1.753 m)   Wt 78 kg   SpO2 98%   BMI 25.40 kg/m  General:   Alert,  pleasant and cooperative in NAD Head:  Normocephalic and atraumatic. Neck:  Supple; Lungs:  Clear throughout to auscultation.    Heart:  Regular rate and rhythm. Abdomen:  Soft, nontender and nondistended. Normal bowel sounds, without guarding, and without rebound.   Neurologic:  Alert and  oriented x4;  grossly normal neurologically.  Impression/Plan:    SCREENING/anorexia/WEIGHT LOSS  Plan:  1. TCS/EGD TODAY DISCUSSED  PROCEDURE, BENEFITS, & RISKS: < 1% chance of medication reaction, bleeding, perforation, or rupture of spleen/liver.

## 2018-06-19 ENCOUNTER — Encounter: Payer: Self-pay | Admitting: Gastroenterology

## 2018-06-21 ENCOUNTER — Telehealth: Payer: Self-pay | Admitting: Gastroenterology

## 2018-06-21 NOTE — Telephone Encounter (Signed)
PT is aware. He had not gotten his prescription that was sent on 03/30/2018. He will check with the pharmacy and let us know if there are any concerns.

## 2018-06-21 NOTE — Telephone Encounter (Signed)
Please call pt. His stomach Bx shows gastritis. No source for your weight loss was identified. IT IS MOST LIKELY DUE TO YOUR WORK SCHEDULE, THE CHEMO, AND gastritis.    DRINK WATER TO KEEP YOUR URINE LIGHT YELLOW. TAKE PROTONIX ONCE DAILY TO TREAT GASTRITIS FOR THE NEXT 3 MOS THEN USE IF NEEDED FOR HEARTBURN OR UPPER ABDOMINAL PAIN. FOLLOW A HIGH FIBER DIET. AVOID ITEMS THAT CAUSE BLOATING.  FOLLOW UP IN 4 MOS.  Next colonoscopy in 10 years.

## 2018-06-22 ENCOUNTER — Encounter (HOSPITAL_COMMUNITY): Payer: Self-pay | Admitting: Gastroenterology

## 2018-06-22 NOTE — Telephone Encounter (Signed)
PATIENT SCHEDULED AND ON RECALL  °

## 2018-06-30 ENCOUNTER — Inpatient Hospital Stay (HOSPITAL_COMMUNITY): Payer: Self-pay | Attending: Hematology

## 2018-06-30 DIAGNOSIS — R5383 Other fatigue: Secondary | ICD-10-CM | POA: Insufficient documentation

## 2018-06-30 DIAGNOSIS — C8170 Other classical Hodgkin lymphoma, unspecified site: Secondary | ICD-10-CM

## 2018-06-30 DIAGNOSIS — D890 Polyclonal hypergammaglobulinemia: Secondary | ICD-10-CM | POA: Insufficient documentation

## 2018-06-30 DIAGNOSIS — C8190 Hodgkin lymphoma, unspecified, unspecified site: Secondary | ICD-10-CM | POA: Insufficient documentation

## 2018-06-30 DIAGNOSIS — F17210 Nicotine dependence, cigarettes, uncomplicated: Secondary | ICD-10-CM | POA: Insufficient documentation

## 2018-06-30 LAB — CBC WITH DIFFERENTIAL/PLATELET
Abs Immature Granulocytes: 0.01 10*3/uL (ref 0.00–0.07)
BASOS PCT: 0 %
Basophils Absolute: 0 10*3/uL (ref 0.0–0.1)
EOS PCT: 2 %
Eosinophils Absolute: 0.1 10*3/uL (ref 0.0–0.5)
HCT: 40.4 % (ref 39.0–52.0)
Hemoglobin: 12.2 g/dL — ABNORMAL LOW (ref 13.0–17.0)
Immature Granulocytes: 0 %
Lymphocytes Relative: 41 %
Lymphs Abs: 2.1 10*3/uL (ref 0.7–4.0)
MCH: 28 pg (ref 26.0–34.0)
MCHC: 30.2 g/dL (ref 30.0–36.0)
MCV: 92.7 fL (ref 80.0–100.0)
MONO ABS: 0.6 10*3/uL (ref 0.1–1.0)
Monocytes Relative: 12 %
Neutro Abs: 2.3 10*3/uL (ref 1.7–7.7)
Neutrophils Relative %: 45 %
PLATELETS: 195 10*3/uL (ref 150–400)
RBC: 4.36 MIL/uL (ref 4.22–5.81)
RDW: 14.3 % (ref 11.5–15.5)
WBC: 5 10*3/uL (ref 4.0–10.5)
nRBC: 0 % (ref 0.0–0.2)

## 2018-06-30 LAB — COMPREHENSIVE METABOLIC PANEL
ALT: 18 U/L (ref 0–44)
ANION GAP: 6 (ref 5–15)
AST: 24 U/L (ref 15–41)
Albumin: 3.6 g/dL (ref 3.5–5.0)
Alkaline Phosphatase: 47 U/L (ref 38–126)
BUN: 11 mg/dL (ref 6–20)
CALCIUM: 9 mg/dL (ref 8.9–10.3)
CO2: 27 mmol/L (ref 22–32)
Chloride: 106 mmol/L (ref 98–111)
Creatinine, Ser: 1.1 mg/dL (ref 0.61–1.24)
Glucose, Bld: 110 mg/dL — ABNORMAL HIGH (ref 70–99)
Potassium: 3.8 mmol/L (ref 3.5–5.1)
SODIUM: 139 mmol/L (ref 135–145)
Total Bilirubin: 0.9 mg/dL (ref 0.3–1.2)
Total Protein: 8.8 g/dL — ABNORMAL HIGH (ref 6.5–8.1)

## 2018-06-30 LAB — LACTATE DEHYDROGENASE: LDH: 138 U/L (ref 98–192)

## 2018-06-30 LAB — SEDIMENTATION RATE: SED RATE: 49 mm/h — AB (ref 0–16)

## 2018-07-01 LAB — IGG, IGA, IGM
IGG (IMMUNOGLOBIN G), SERUM: 2830 mg/dL — AB (ref 700–1600)
IgA: 351 mg/dL (ref 90–386)
IgM (Immunoglobulin M), Srm: 179 mg/dL — ABNORMAL HIGH (ref 20–172)

## 2018-07-03 LAB — KAPPA/LAMBDA LIGHT CHAINS
Kappa free light chain: 66.3 mg/L — ABNORMAL HIGH (ref 3.3–19.4)
Kappa, lambda light chain ratio: 2.24 — ABNORMAL HIGH (ref 0.26–1.65)
Lambda free light chains: 29.6 mg/L — ABNORMAL HIGH (ref 5.7–26.3)

## 2018-07-03 LAB — PROTEIN ELECTROPHORESIS, SERUM
A/G Ratio: 0.7 (ref 0.7–1.7)
ALBUMIN ELP: 3.3 g/dL (ref 2.9–4.4)
ALPHA-1-GLOBULIN: 0.2 g/dL (ref 0.0–0.4)
Alpha-2-Globulin: 0.8 g/dL (ref 0.4–1.0)
Beta Globulin: 1 g/dL (ref 0.7–1.3)
GAMMA GLOBULIN: 2.7 g/dL — AB (ref 0.4–1.8)
Globulin, Total: 4.8 g/dL — ABNORMAL HIGH (ref 2.2–3.9)
TOTAL PROTEIN ELP: 8.1 g/dL (ref 6.0–8.5)

## 2018-07-06 ENCOUNTER — Ambulatory Visit (HOSPITAL_COMMUNITY)
Admission: RE | Admit: 2018-07-06 | Discharge: 2018-07-06 | Disposition: A | Discharge status: Home / Self Care | Payer: Self-pay | Source: Ambulatory Visit | Attending: Internal Medicine | Admitting: Internal Medicine

## 2018-07-06 ENCOUNTER — Ambulatory Visit (HOSPITAL_COMMUNITY): Payer: Self-pay

## 2018-07-06 DIAGNOSIS — J438 Other emphysema: Secondary | ICD-10-CM | POA: Insufficient documentation

## 2018-07-06 DIAGNOSIS — M899 Disorder of bone, unspecified: Secondary | ICD-10-CM

## 2018-07-06 DIAGNOSIS — R143 Flatulence: Secondary | ICD-10-CM | POA: Insufficient documentation

## 2018-07-06 DIAGNOSIS — M47892 Other spondylosis, cervical region: Secondary | ICD-10-CM | POA: Insufficient documentation

## 2018-07-06 DIAGNOSIS — C8170 Other classical Hodgkin lymphoma, unspecified site: Secondary | ICD-10-CM | POA: Insufficient documentation

## 2018-07-06 DIAGNOSIS — J432 Centrilobular emphysema: Secondary | ICD-10-CM | POA: Insufficient documentation

## 2018-07-06 DIAGNOSIS — R918 Other nonspecific abnormal finding of lung field: Secondary | ICD-10-CM | POA: Insufficient documentation

## 2018-07-06 MED ORDER — IOPAMIDOL (ISOVUE-300) INJECTION 61%
100.0000 mL | Freq: Once | INTRAVENOUS | Status: AC | PRN
  Administered 2018-07-06: 100 mL via INTRAVENOUS

## 2018-07-07 ENCOUNTER — Ambulatory Visit (HOSPITAL_COMMUNITY): Payer: Medicaid Other | Admitting: Hematology

## 2018-07-07 ENCOUNTER — Encounter (HOSPITAL_COMMUNITY): Payer: Self-pay | Admitting: Hematology

## 2018-07-07 ENCOUNTER — Other Ambulatory Visit: Payer: Self-pay

## 2018-07-07 ENCOUNTER — Inpatient Hospital Stay (HOSPITAL_BASED_OUTPATIENT_CLINIC_OR_DEPARTMENT_OTHER): Payer: Self-pay | Admitting: Hematology

## 2018-07-07 VITALS — BP 101/60 | HR 60 | Temp 98.5°F | Resp 16 | Wt 170.0 lb

## 2018-07-07 DIAGNOSIS — D890 Polyclonal hypergammaglobulinemia: Secondary | ICD-10-CM

## 2018-07-07 DIAGNOSIS — C8190 Hodgkin lymphoma, unspecified, unspecified site: Secondary | ICD-10-CM

## 2018-07-07 DIAGNOSIS — R5383 Other fatigue: Secondary | ICD-10-CM

## 2018-07-07 DIAGNOSIS — F17210 Nicotine dependence, cigarettes, uncomplicated: Secondary | ICD-10-CM

## 2018-07-07 NOTE — Patient Instructions (Signed)
[place] at [facility] Discharge Instructions  Follow up in 6 months with labs one week prior.   Thank you for choosing [place] at [facility] to provide your oncology and hematology care.  To afford each patient quality time with our provider, please arrive at least 15 minutes before your scheduled appointment time.   If you have a lab appointment with the [facility] please come in thru the  Main Entrance and check in at the main information desk  You need to re-schedule your appointment should you arrive 10 or more minutes late.  We strive to give you quality time with our providers, and arriving late affects you and other patients whose appointments are after yours.  Also, if you no show three or more times for appointments you may be dismissed from the clinic at the providers discretion.     Again, thank you for choosing [facility].  Our hope is that these requests will decrease the amount of time that you wait before being seen by our physicians.       _____________________________________________________________  Should you have questions after your visit to [facility], please contact our office at [phone] between the hours of 8:00 a.m. and 4:30 p.m.  Voicemails left after 4:00 p.m. will not be returned until the following business day.  For prescription refill requests, have your pharmacy contact our office and allow 72 hours.    Cancer Center Support Programs:   > Cancer Support Group  2nd Tuesday of the month 1pm-2pm, Journey Room

## 2018-07-07 NOTE — Assessment & Plan Note (Signed)
1.  Stage IIIb Hodgkin's lymphoma: -Diagnosed on 01/13/2015 by inguinal lymph node biopsy, bone marrow biopsy negative, treated with 6 cycles of ABVD, completed 07/08/2015 -Restaging PET/CT following 2 cycles showed complete response. - Found to have multiple lytic lesions in the pelvis at the time of diagnosis on PET scan in May 2016 - Biopsy of the lytic lesion in the left iliac crest on 11/26/2015 shows normal bone marrow with no evidence of myeloma. - Today's physical examination did not reveal any adenopathy or splenomegaly.  He denies any fevers, night sweats or weight loss.  He is continuing to work 10 to 12 hours/day. - He reports that he is not able to gain any weight.  He has developed lactose intolerance and also gastritis.  I will make a referral to dietitian to discuss nutritional intake options. - I have reviewed his blood work which is within normal limits.  SPEP was within normal limits.  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  Free light chain ratio which is mildly elevated is also more or less stable. -We will see him back in 6 months for follow-up.

## 2018-07-07 NOTE — Progress Notes (Signed)
[place] Aceved[place],  [id number]   CLINIC:  Medical Oncology/Hematology  PCP:  [place] Acevedo   REASON FOR VISIT: Follow-up for hodgkins lymphoma  CURRENT THERAPY: Observation  BRIEF ONCOLOGIC HISTORY:    Hodgkin lymphoma ([place])   01/13/2015 Initial Biopsy    L inguinal LN biopsy on 01/13/2015 with Classical Hodgkin Lymphoma, nodular sclerosing type    01/20/2015 Bone Marrow Biopsy    normal BMBX, mild polyclonal plasmacytosis    01/28/2015 Imaging    PET/CT Mild hyper metabolism is associated with the borderline enlarged bilateral axillary and mediastinal lymph nodes. There is more intense FDG uptake associated with bilateral inguinal lymph nodes. focal area of peripheral consolidation in the lingula.    01/29/2015 Imaging    MUGA with EF 69%    01/29/2015 Imaging    1.spirometry is normal .no change with bronchodilator. lung volumes are normal. airway resistance somewhat high. DLCO normal decreased MIP/MEP may indicate weakness of the bellows function of the lung.     02/04/2015 - 07/08/2015 Chemotherapy    ABVD x 6 cycles    03/28/2015 PET scan    No adenopathy or abnormal hypermetabolic activity along the previous axillary, mediastinal, and inguinal lymph node chains. [place] 1.    03/31/2015 Imaging    MUGA- Normal LEFT ventricular ejection fraction of 70%, not significantly changed from the 69% calculated on the previous study of 01/29/2015.    05/30/2015 Imaging    MUGA- No significant change in ejection fraction equal 73%    07/29/2015 Imaging    PET/CT no specific evidence of recurrent metabolically active Hodgkin's    07/29/2015 Remission       09/10/2015 Imaging    MRI pelvis- Multiple stable lytic slightly enhancing lesions in the iliac bones and sacrum, unchanged since 12/20/2014. Even though biopsy was negative for myeloma in May 2016, I think the patient remains at increased risk for development of  myeloma.    11/26/2015 Pathology Results    BMBX negative, polyclonal plasmacytosis noted    01/05/2016 Imaging    CT neck- No evidence of recurrent lymphoma in the neck.    01/05/2016 Imaging    CT CAP- Small bilateral axillary nodes and AP window mediastinal node, within normal limits and non FDG avid on prior PET. No suspicious lymphadenopathy in the chest, abdomen, or pelvis by CT size criteria. Spleen is normal in size.     08/05/2016 Imaging    Stable exam. No evidence of recurrent lymphoma or other acute findings.    03/24/2017 Imaging    CT CAP- Numerous shotty lymph nodes in the mediastinum, bilateral axilla, left subpectoral chest wall, and bilateral inguinal regions, the largest measuring up to 10 mm in short axis. These are nonspecific, however lymphoma recurrence cannot be excluded.  Numerous solid and ground-glass pulmonary nodules. The largest 6.6 mm ground-glass pulmonary nodule in the left upper lobe is stable from 2016. Area of airspace consolidation, round atelectasis or pulmonary nodule in the right middle lobe measures 13 mm. Non-contrast chest CT at 3-6 months is recommended. If the nodules are stable at time of repeat CT, then future CT at 18-24 months (from today's scan) is considered optional for low-risk patients, but is recommended for high-risk patients. This recommendation follows the consensus statement: Guidelines for Management of Incidental Pulmonary Nodules Detected on CT Images: From the Fleischner Society 2017; Radiology 2017; 284:228-243.  Stable appearance of known bilateral pelvic lytic  osseous lesions.    04/15/2017 Imaging    CT abd/pelvis- 1. Negative for bowel obstruction or significant bowel wall thickening. 2. 8 x 11 mm vague hypodense focus within the liver, not clearly seen on prior studies. Suggest correlation with nonemergent MRI. 3. Trace fluid in the pelvis      CANCER STAGING: Cancer Staging Hodgkin lymphoma [facility]) Staging  form: Lymphoid Neoplasms, AJCC 6th Edition - Clinical stage from 03/04/2015: Stage III - Signed by Baird Cancer, PA-C on 11/04/2015    INTERVAL HISTORY:  Robert Acevedo 47 y.o. male returns for routine follow-up for hogkins lymphoma. He reports fatigue daily. Patient is having problems trying to gain weight. He has not lost any but can not gain. His appetite is decreased and reports it at 50%. He is also lactose intolerant and recently diagnosis with gastritis. He is wanting to talk to the dietitian. He denies any new pains. Denies any nausea, vomiting, diarrhea. Denies any numbness or tingling. Denies any easy bruising or bleeding. He reports his energy level at 50%. He still works full time for the city of [place].     REVIEW OF SYSTEMS:  Review of Systems  Constitutional: Positive for fatigue.  All other systems reviewed and are negative.    PAST MEDICAL/SURGICAL HISTORY:  Past Medical History:  Diagnosis Date  . Bronchitis   . Hodgkin's lymphoma ([place])   . Lymphadenopathy, inguinal    left   Past Surgical History:  Procedure Laterality Date  . BONE MARROW ASPIRATION Left 01/20/15  . BONE MARROW BIOPSY Left 01/20/15  . COLONOSCOPY N/A 06/16/2018   Procedure: COLONOSCOPY;  Surgeon: Danie Binder, MD;  [facility];  Service: Endoscopy;  Laterality: N/A;  1:15pm  . ESOPHAGOGASTRODUODENOSCOPY N/A 06/16/2018   Procedure: ESOPHAGOGASTRODUODENOSCOPY (EGD);  Surgeon: Danie Binder, MD;  Location: [facility]O SU[facility];  Service: Endoscopy;  Laterality: N/A;  . INCISION AND DRAINAGE PERITONSILLAR ABSCESS    . KNEE SURGERY    . LYMPH NODE BIOPSY Left 01/13/2015   Procedure: LEFT INGUINAL LYMPH NODE BIOPSY;  Surgeon: Aviva Signs Md, MD;  Location: [facility] ORS;  Service: General;  Laterality: Left;  . PORT-A-CATH REMOVAL Left 09/08/2016   Procedure: MINOR REMOVAL PORT-A-CATH;  Surgeon: Aviva Signs, MD;  Location: [facility] ORS;  Service: General;  Laterality: Left;  . PORTACATH PLACEMENT  Left 01/31/2015   Procedure: INSERTION PORT-A-CATH;  Surgeon: Aviva Signs Md, MD;  Location: [facility] ORS;  Service: General;  Laterality: Left;  . WRIST SURGERY     right     SOCIAL HISTORY:  Social History   Socioeconomic History  . Marital status: Single    Spouse name: Not on file  . Number of children: Not on file  . Years of education: Not on file  . Highest education level: Not on file  Occupational History  . Not on file  Social Needs  . Financial resource strain: Not on file  . Food insecurity:    Worry: Not on file    Inability: Not on file  . Transportation needs:    Medical: Not on file    Non-medical: Not on file  Tobacco Use  . Smoking status: Current Every Day Smoker    Packs/day: 0.50    Years: 2.00    Pack years: 1.00    Types: Cigarettes  . Smokeless tobacco: Never Used  Substance and Sexual Activity  . Alcohol use: Yes    Comment: occasional  . Drug use: Yes    Types:  Marijuana    Comment: daily  . Sexual activity: Yes    Birth control/protection: Acevedo  Lifestyle  . Physical activity:    Days per week: Not on file    Minutes per session: Not on file  . Stress: Not on file  Relationships  . Social connections:    Talks on phone: Not on file    Gets together: Not on file    Attends religious service: Not on file    Active member of club or organization: Not on file    Attends meetings of clubs or organizations: Not on file    Relationship status: Not on file  . Intimate partner violence:    Fear of current or ex partner: Not on file    Emotionally abused: Not on file    Physically abused: Not on file    Forced sexual activity: Not on file  Other Topics Concern  . Not on file  Social History Narrative  . Not on file    FAMILY HISTORY:  Family History  Problem Relation Age of Onset  . Seizures Mother   . Diabetes Father     CURRENT MEDICATIONS:  Outpatient Encounter Medications as of 07/07/2018  Medication Sig  . Multiple Vitamin  (MULTIVITAMIN WITH MINERALS) TABS tablet Take 1 tablet by mouth daily.  . pantoprazole (PROTONIX) 40 MG tablet Take 1 tablet (40 mg total) by mouth daily before breakfast.   No facility-administered encounter medications on file as of 07/07/2018.     ALLERGIES:  Allergies  Allergen Reactions  . Lactose Intolerance (Gi)     Gi distress   . Clindamycin/Lincomycin Rash     PHYSICAL EXAM:  ECOG Performance status: 1  Vitals:   07/07/18 1211  BP: 101/60  Pulse: 60  Resp: 16  Temp: 98.5 F (36.9 C)  SpO2: 100%   Filed Weights   07/07/18 1211  Weight: 170 lb (77.1 kg)    Physical Exam  Constitutional: He is oriented to person, place, and time. He appears well-developed and well-nourished.  Abdominal: Soft. He exhibits no distension. There is no tenderness.  Musculoskeletal: Normal range of motion.  Neurological: He is alert and oriented to person, place, and time.  Skin: Skin is warm.  Psychiatric: He has a normal mood and affect. His behavior is normal. Judgment and thought content normal.  Abdomen: No palpable splenomegaly. Lymphadenopathy: No palpable supraclavicular axillary or inguinal adenopathy.   LABORATORY DATA:  I have reviewed the labs as listed.  CBC    Component Value Date/Time   WBC 5.0 06/30/2018 1333   RBC 4.36 06/30/2018 1333   HGB 12.2 (L) 06/30/2018 1333   HCT 40.4 06/30/2018 1333   PLT 195 06/30/2018 1333   MCV 92.7 06/30/2018 1333   MCH 28.0 06/30/2018 1333   MCHC 30.2 06/30/2018 1333   RDW 14.3 06/30/2018 1333   LYMPHSABS 2.1 06/30/2018 1333   MONOABS 0.6 06/30/2018 1333   EOSABS 0.1 06/30/2018 1333   BASOSABS 0.0 06/30/2018 1333   CMP Latest Ref Rng & Units 06/30/2018 11/29/2017 04/15/2017  Glucose 70 - 99 mg/dL 110(H) 97 90  BUN 6 - 20 mg/dL '11 14 15  ' Creatinine 0.61 - 1.24 mg/dL 1.10 1.14 1.20  Sodium 135 - 145 mmol/L 139 140 139  Potassium 3.5 - 5.1 mmol/L 3.8 4.2 3.8  Chloride 98 - 111 mmol/L 106 102 101  CO2 22 - 32 mmol/L '27 30 31    ' Calcium 8.9 - 10.3 mg/dL 9.0 9.3 9.5  Total Protein 6.5 - 8.1 g/dL 8.8(H) 9.2(H) 9.6(H)  Total Bilirubin 0.3 - 1.2 mg/dL 0.9 0.5 0.6  Alkaline Phos 38 - 126 U/L 47 53 54  AST 15 - 41 U/L '24 21 21  ' ALT 0 - 44 U/L 18 15(L) 15(L)       DIAGNOSTIC IMAGING:  I have reviewed his most recent CT scans.    ASSESSMENT & PLAN:   Hodgkin lymphoma 1.  Stage IIIb Hodgkin's lymphoma: -Diagnosed on 01/13/2015 by inguinal lymph node biopsy, bone marrow biopsy negative, treated with 6 cycles of ABVD, completed 07/08/2015 -Restaging PET/CT following 2 cycles showed complete response. - Found to have multiple lytic lesions in the pelvis at the time of diagnosis on PET scan in May 2016 - Biopsy of the lytic lesion in the left iliac crest on 11/26/2015 shows normal bone marrow with no evidence of myeloma. - Today's physical examination did not reveal any adenopathy or splenomegaly.  He denies any fevers, night sweats or weight loss.  He is continuing to work 10 to 12 hours/day. - He reports that he is not able to gain any weight.  He has developed lactose intolerance and also gastritis.  I will make a referral to dietitian to discuss nutritional intake options. - I have reviewed his blood work which is within normal limits.  SPEP was within normal limits.  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  Free light chain ratio which is mildly elevated is also more or less stable. -We will see him back in 6 months for follow-up.      Orders placed this encounter:  Orders Placed This Encounter  Procedures  . Lactate dehydrogenase  . Protein electrophoresis, serum  . Immunofixation electrophoresis  . Kappa/lambda light chains  . IgG, IgA, IgM  . CBC with Differential/Platelet  . Comprehensive metabolic panel  . Sedimentation rate  . TSH      Derek Jack, [place] [phone]

## 2018-07-14 ENCOUNTER — Inpatient Hospital Stay (HOSPITAL_COMMUNITY): Payer: Self-pay

## 2018-07-14 NOTE — Progress Notes (Signed)
Nutrition Assessment   Reason for Assessment:   Questions regarding lactose free diet, weight gain and gastritis.     ASSESSMENT:   47 year old male with hodgkins lymphoma (2016) currently under observation.  Past medical history reviewed.  Note recent EGD with gastritis noted.    Met with patient today in clinic.  Patient reports that he eats about 1 - 2 meals per day due to his work schedule.  Patient job requires physical labor (digging holes, putting up light poles, etc).  Sometimes carries snacks (peanut butter crackers).  Reports that he has trouble tolerating milk and diary products. "It makes my stomach hurt."  Reports that he can't gain weight and he is concerned about that.    Nutrition Focused Physical Exam: deferred   Medications: MVI, protonix   Labs: reviewed   Anthropometrics:   Height: 69 inches Weight: 170 lb (11/8) 172 lb 10/18 8/1 166 lb 8/7 173 lb UBW: prior to diagnosis 180s per patient.  Stable weight 160-170s BMI: 25  Estimated Energy Needs  Kcals: 4935-5217 calories/d Protein: 72-92 g/d Fluid: 2.3 L/d   NUTRITION DIAGNOSIS: Food and nutrition related knowledge deficit related to lactose intolerance, gastritis as evidenced by referral to RD.   INTERVENTION:  Educated patient on lactose intolerance and reading food labels.  Handout from AND regarding lactose nutrition therapy provided. Discussed diet considerations with gastritis. Discussed importance of increasing calories for weight gain. Provided patient with daily calorie goal to help gain weight.   Provided patient with non-diary free oral nutrition supplements (samples) and recipes of shakes to try.   Discussed importance of frequent meals/snacks.   Contact information provided to patient   MONITORING, EVALUATION, GOAL: Patient will consume adequate calories and protein to increase weight   Next Visit: no follow-up planned.  Patient to call RD if follow-up needed  Juno Bozard B. Zenia Resides, [place],  [place] Registered Dietitian [phone] (pager)

## 2018-08-19 ENCOUNTER — Other Ambulatory Visit: Payer: Self-pay | Admitting: Gastroenterology

## 2018-09-05 ENCOUNTER — Other Ambulatory Visit: Payer: Self-pay

## 2018-09-05 ENCOUNTER — Encounter (HOSPITAL_COMMUNITY): Payer: Self-pay | Admitting: Emergency Medicine

## 2018-09-05 ENCOUNTER — Emergency Department (HOSPITAL_COMMUNITY)
Admission: EM | Admit: 2018-09-05 | Discharge: 2018-09-06 | Disposition: A | Discharge status: Home / Self Care | Payer: Self-pay | Attending: Emergency Medicine | Admitting: Emergency Medicine

## 2018-09-05 DIAGNOSIS — F17210 Nicotine dependence, cigarettes, uncomplicated: Secondary | ICD-10-CM | POA: Insufficient documentation

## 2018-09-05 DIAGNOSIS — D649 Anemia, unspecified: Secondary | ICD-10-CM | POA: Insufficient documentation

## 2018-09-05 DIAGNOSIS — R112 Nausea with vomiting, unspecified: Secondary | ICD-10-CM | POA: Insufficient documentation

## 2018-09-05 DIAGNOSIS — R109 Unspecified abdominal pain: Secondary | ICD-10-CM | POA: Insufficient documentation

## 2018-09-05 DIAGNOSIS — Z8571 Personal history of Hodgkin lymphoma: Secondary | ICD-10-CM | POA: Insufficient documentation

## 2018-09-05 DIAGNOSIS — Z79899 Other long term (current) drug therapy: Secondary | ICD-10-CM | POA: Insufficient documentation

## 2018-09-05 NOTE — ED Triage Notes (Signed)
Pt returns for abdominal pain, treated for gastritis, continues to have pain and vomiting.

## 2018-09-06 ENCOUNTER — Emergency Department (HOSPITAL_COMMUNITY): Payer: Self-pay

## 2018-09-06 LAB — LIPASE, BLOOD: Lipase: 31 U/L (ref 11–51)

## 2018-09-06 LAB — COMPREHENSIVE METABOLIC PANEL WITH GFR
ALT: 16 U/L (ref 0–44)
AST: 22 U/L (ref 15–41)
Albumin: 3.5 g/dL (ref 3.5–5.0)
Alkaline Phosphatase: 45 U/L (ref 38–126)
Anion gap: 7 (ref 5–15)
BUN: 15 mg/dL (ref 6–20)
CO2: 28 mmol/L (ref 22–32)
Calcium: 9 mg/dL (ref 8.9–10.3)
Chloride: 100 mmol/L (ref 98–111)
Creatinine, Ser: 1.1 mg/dL (ref 0.61–1.24)
GFR calc Af Amer: >60 mL/min
GFR calc non Af Amer: >60 mL/min
Glucose, Bld: 108 mg/dL — ABNORMAL HIGH (ref 70–99)
Potassium: 3.7 mmol/L (ref 3.5–5.1)
Sodium: 135 mmol/L (ref 135–145)
Total Bilirubin: 0.7 mg/dL (ref 0.3–1.2)
Total Protein: 8.9 g/dL — ABNORMAL HIGH (ref 6.5–8.1)

## 2018-09-06 LAB — URINALYSIS, ROUTINE W REFLEX MICROSCOPIC
Bacteria, UA: NONE SEEN
Bilirubin Urine: NEGATIVE
GLUCOSE, UA: NEGATIVE mg/dL
Hgb urine dipstick: NEGATIVE
Ketones, ur: NEGATIVE mg/dL
Nitrite: NEGATIVE
Protein, ur: 30 mg/dL — AB
Specific Gravity, Urine: 1.028 (ref 1.005–1.030)
pH: 6 (ref 5.0–8.0)

## 2018-09-06 LAB — CBC WITH DIFFERENTIAL/PLATELET
ABS IMMATURE GRANULOCYTES: 0.02 10*3/uL (ref 0.00–0.07)
BASOS PCT: 0 %
Basophils Absolute: 0 10*3/uL (ref 0.0–0.1)
Eosinophils Absolute: 0.1 10*3/uL (ref 0.0–0.5)
Eosinophils Relative: 1 %
HEMATOCRIT: 40.1 % (ref 39.0–52.0)
Hemoglobin: 12.7 g/dL — ABNORMAL LOW (ref 13.0–17.0)
IMMATURE GRANULOCYTES: 0 %
Lymphocytes Relative: 39 %
Lymphs Abs: 2.7 10*3/uL (ref 0.7–4.0)
MCH: 28.6 pg (ref 26.0–34.0)
MCHC: 31.7 g/dL (ref 30.0–36.0)
MCV: 90.3 fL (ref 80.0–100.0)
MONO ABS: 0.8 10*3/uL (ref 0.1–1.0)
MONOS PCT: 11 %
NEUTROS ABS: 3.2 10*3/uL (ref 1.7–7.7)
NEUTROS PCT: 49 %
PLATELETS: 215 10*3/uL (ref 150–400)
RBC: 4.44 MIL/uL (ref 4.22–5.81)
RDW: 14 % (ref 11.5–15.5)
WBC: 6.8 10*3/uL (ref 4.0–10.5)
nRBC: 0 % (ref 0.0–0.2)

## 2018-09-06 MED ORDER — ONDANSETRON HCL 4 MG/2ML IJ SOLN
4.0000 mg | Freq: Once | INTRAMUSCULAR | Status: AC
  Administered 2018-09-06: 4 mg via INTRAVENOUS
  Filled 2018-09-06: qty 2

## 2018-09-06 MED ORDER — SODIUM CHLORIDE 0.9 % IV BOLUS
1000.0000 mL | Freq: Once | INTRAVENOUS | Status: AC
  Administered 2018-09-06: 1000 mL via INTRAVENOUS

## 2018-09-06 MED ORDER — IOPAMIDOL (ISOVUE-300) INJECTION 61%
100.0000 mL | Freq: Once | INTRAVENOUS | Status: AC | PRN
  Administered 2018-09-06: 100 mL via INTRAVENOUS

## 2018-09-06 MED ORDER — MORPHINE SULFATE (PF) 4 MG/ML IV SOLN
4.0000 mg | Freq: Once | INTRAVENOUS | Status: AC
  Administered 2018-09-06: 4 mg via INTRAVENOUS
  Filled 2018-09-06: qty 1

## 2018-09-06 MED ORDER — ONDANSETRON HCL 4 MG PO TABS: 4.0000 mg | ORAL_TABLET | Freq: Four times a day (QID) | ORAL | 0 refills | Status: AC | PRN

## 2018-09-06 MED ORDER — TRAMADOL HCL 50 MG PO TABS: 50.0000 mg | ORAL_TABLET | Freq: Four times a day (QID) | ORAL | 0 refills | Status: AC | PRN

## 2018-09-06 NOTE — Discharge Instructions (Addendum)
Return if pain is getting worse. °

## 2018-09-06 NOTE — ED Provider Notes (Signed)
[facility] EMERGENCY DEPARTMENT Provider Note   CSN: [id number] Arrival date & time: 09/05/18  2012     History   Chief Complaint Chief Complaint  Patient presents with  . Abdominal Pain    HPI Robert Acevedo is a 48 y.o. male.  The history is provided by the patient.  He has history of Hodgkin's lymphoma and comes in because of abdominal pain and nausea and vomiting.  3 months ago, he had upper endoscopy and colonoscopy and was diagnosed with gastritis and given a prescription for pantoprazole.  He was doing well until 3.5 days ago when he ate a [place] bacon biscuit.  Since then, he has had crampy midabdominal pain without radiation.  There is been associated nausea and vomiting.  Pain is worse anytime she eats anything, even water.  He denies constipation or diarrhea.  He denies fever or chills.  Pain is rated at 10/10.  He has not taken anything for it other than continuing to take the pantoprazole.  He states the pantoprazole actually seems to make it worse.  Past Medical History:  Diagnosis Date  . Bronchitis   . Hodgkin's lymphoma ([place])   . Lymphadenopathy, inguinal    left    Patient Active Problem List   Diagnosis Date Noted  . Colon cancer screening   . Poor appetite 03/30/2018  . Abdominal pain, epigastric 03/30/2018  . Loss of weight 03/30/2018  . 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 Nuclear sclerotic cataract of both eyes 12/03/2016  . 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 Proliferative retinopathy, non-diabetic, bilateral 12/03/2016  . Retinal edema 12/03/2016  . Port catheter in place 02/05/2016  . Uveitis 08/15/2015  . Hodgkin lymphoma ([place]) 01/23/2015  . Lytic bone lesion of hip   . Abnormal presence of protein in urine     Past Surgical History:  Procedure Laterality Date  . BONE MARROW ASPIRATION Left 01/20/15  . BONE MARROW BIOPSY Left 01/20/15  . COLONOSCOPY N/A 06/16/2018   Procedure: COLONOSCOPY;  Surgeon: Danie Binder, MD;   [facility];  Service: Endoscopy;  Laterality: N/A;  1:15pm  . ESOPHAGOGASTRODUODENOSCOPY N/A 06/16/2018   Procedure: ESOPHAGOGASTRODUODENOSCOPY (EGD);  Surgeon: Danie Binder, MD;  Location: [facility] SU[facility];  Service: Endoscopy;  Laterality: N/A;  . INCISION AND DRAINAGE PERITONSILLAR ABSCESS    . KNEE SURGERY    . LYMPH NODE BIOPSY Left 01/13/2015   Procedure: LEFT INGUINAL LYMPH NODE BIOPSY;  Surgeon: Aviva Signs Md, MD;  Location: [facility] ORS;  Service: General;  Laterality: Left;  . PORT-A-CATH REMOVAL Left 09/08/2016   Procedure: MINOR REMOVAL PORT-A-CATH;  Surgeon: Aviva Signs, MD;  Location: [facility] ORS;  Service: General;  Laterality: Left;  . PORTACATH PLACEMENT Left 01/31/2015   Procedure: INSERTION PORT-A-CATH;  Surgeon: Aviva Signs Md, MD;  Location: [facility] ORS;  Service: General;  Laterality: Left;  . WRIST SURGERY     right        Home Medications    Prior to Admission medications   Medication Sig Start Date End Date Taking? Authorizing Provider  Multiple Vitamin (MULTIVITAMIN WITH MINERALS) TABS tablet Take 1 tablet by mouth daily.    [provider]  pantoprazole (PROTONIX) 40 MG tablet TAKE 1 TABLET BY MOUTH ONCE DAILY BEFORE BREAKFAST 08/21/18   Annitta Needs, NP    Family History Family History  Problem Relation Age of Onset  . Seizures Mother   . Diabetes Father     Social History Social History  Tobacco Use  . Smoking status: Current Every Day Smoker    Packs/day: 0.50    Years: 2.00    Pack years: 1.00    Types: Cigarettes  . Smokeless tobacco: Never Used  Substance Use Topics  . Alcohol use: Yes    Comment: occasional  . Drug use: Yes    Types: Marijuana    Comment: daily     Allergies   Lactose intolerance (gi) and Clindamycin/lincomycin   Review of Systems Review of Systems  All other systems reviewed and are negative.    Physical Exam Updated Vital Signs BP (!) 132/93 (BP Location: Right Arm)   Pulse 63   Temp 98.3 F  (36.8 C) (Oral)   Resp 16   Ht '5\' 9"'  (1.753 m)   Wt 78 kg   SpO2 99%   BMI 25.40 kg/m   Physical Exam Vitals signs and nursing note reviewed.    48 year old male, resting comfortably and in no acute distress. Vital signs are significant for elevated diastolic blood pressure. Oxygen saturation is 99%, which is normal. Head is normocephalic and atraumatic. PERRLA, EOMI. Oropharynx is clear. Neck is nontender and supple without adenopathy or JVD. Back is nontender and there is no CVA tenderness. Lungs are clear without rales, wheezes, or rhonchi. Chest is nontender. Heart has regular rate and rhythm without murmur. Abdomen is soft, flat, with mild tenderness diffusely.  There is no rebound or guarding.  There are no masses or hepatosplenomegaly and peristalsis is slightly hypoactive. Extremities have no cyanosis or edema, full range of motion is present. Skin is warm and dry without rash. Neurologic: Mental status is normal, cranial nerves are intact, there are no motor or sensory deficits.  ED Treatments / Results  Labs (all labs ordered are listed, but only abnormal results are displayed) Labs Reviewed  COMPREHENSIVE METABOLIC PANEL - Abnormal; Notable for the following components:      Result Value   Glucose, Bld 108 (*)    Total Protein 8.9 (*)    All other components within normal limits  CBC WITH DIFFERENTIAL/PLATELET - Abnormal; Notable for the following components:   Hemoglobin 12.7 (*)    All other components within normal limits  URINALYSIS, ROUTINE W REFLEX MICROSCOPIC - Abnormal; Notable for the following components:   Protein, ur 30 (*)    Leukocytes, UA SMALL (*)    All other components within normal limits  LIPASE, BLOOD   Radiology Ct Abdomen Pelvis W Contrast  Result Date: 09/06/2018 CLINICAL DATA:  Abdominal pain. History of lymphoma. EXAM: CT ABDOMEN AND PELVIS WITH CONTRAST TECHNIQUE: Multidetector CT imaging of the abdomen and pelvis was performed using  the standard protocol following bolus administration of intravenous contrast. CONTRAST:  165m ISOVUE-300 IOPAMIDOL (ISOVUE-300) INJECTION 61% COMPARISON:  07/06/2018 CT [facility] FINDINGS: Lower chest: Top-normal heart size. Subsegmental atelectasis the right middle lobe, lingula and left lower lobe. No effusion. No dominant mass. Hepatobiliary: 10 x 13 mm hypodensity in the left hepatic lobe slightly smaller in appearance than on prior exam where it measured approximately 18 x 15 mm. Findings are nonspecific. Hemangioma is a possibility though other etiologies are not withstanding. No intra or extrahepatic biliary dilatation. Normal gallbladder. Pancreas: Normal Spleen: Normal Adrenals/Urinary Tract: Adrenal glands are unremarkable. Kidneys are normal, without renal calculi, focal lesion, or hydronephrosis. Bladder is unremarkable. Stomach/Bowel: Stomach is within normal limits. Appendix appears normal. No evidence of bowel wall thickening, distention, or inflammatory changes. Vascular/Lymphatic: Right short a left renal vein  a normal anatomic variant. No lymphadenopathy. No significant atherosclerotic disease or aneurysm of the aorta. Reproductive: Prostate and seminal vesicles are unremarkable. Other: Trace free fluid pelvis. No free air. Musculoskeletal: Stable lytic abnormalities of the pelvis similar to prior. No aggressive osseous lesions. No acute fracture. IMPRESSION: 1. No acute intra-abdominal or pelvic pathology. Lymphadenopathy or lymphoproliferative disorder identified. 2. 10 x 13 mm hypodensity in the left hepatic lobe slightly smaller in appearance than on prior exam where it measured 18 x 15 mm. Findings are nonspecific. Hemangioma is a possibility though other etiologies are not withstanding. 3. Stable lytic abnormalities of the bony pelvis. Electronically Signed   By: Ashley Royalty M.D.   On: 09/06/2018 02:30    Procedures Procedures   Medications Ordered in ED Medications  morphine 4 MG/ML  injection 4 mg (4 mg Intravenous Given 09/06/18 0033)  ondansetron (ZOFRAN) injection 4 mg (4 mg Intravenous Given 09/06/18 0032)  sodium chloride 0.9 % bolus 1,000 mL (1,000 mLs Intravenous New Bag/Given 09/06/18 0035)  morphine 4 MG/ML injection 4 mg (4 mg Intravenous Given 09/06/18 0136)  iopamidol (ISOVUE-300) 61 % injection 100 mL (100 mLs Intravenous Contrast Given 09/06/18 0150)     Initial Impression / Assessment and Plan / ED Course  I have reviewed the triage vital signs and the nursing notes.  Pertinent labs & imaging results that were available during my care of the patient were reviewed by me and considered in my medical decision making (see chart for details).  Abdominal pain and vomiting of uncertain cause.  Old records are reviewed confirming upper and lower endoscopy in October with diagnosis of gastritis and prescription given for pantoprazole.  Will check screening labs, give IV fluids, morphine, ondansetron.  Plan he will be sent for CT of abdomen and pelvis.  Of note, he did have CT of chest, abdomen, pelvis 2 months ago for follow-up for his lymphoma and no acute findings were seen at that time.  Labs are significant for mild anemia which is actually improved over baseline.  Remainder of labs are unremarkable.  CT shows no acute process.  He did require a second dose of morphine, but is now feeling much better.  Cause for pain is not clear, but no evidence of serious pathology at this point.  He is discharged with prescriptions for tramadol and ondansetron, follow-up with PCP.  Return precautions discussed.  Final Clinical Impressions(s) / ED Diagnoses   Final diagnoses:  Abdominal pain, unspecified abdominal location  Non-intractable vomiting with nausea, unspecified vomiting type  Normochromic normocytic anemia    ED Discharge Orders         Ordered    traMADol (ULTRAM) 50 MG tablet  Every 6 hours PRN     09/06/18 0255    ondansetron (ZOFRAN) 4 MG tablet  Every 6 hours PRN      09/06/18 4734           Delora Fuel, MD 03/70/96 [phone]

## 2018-09-13 ENCOUNTER — Ambulatory Visit (INDEPENDENT_AMBULATORY_CARE_PROVIDER_SITE_OTHER): Payer: Self-pay | Admitting: Gastroenterology

## 2018-09-13 ENCOUNTER — Encounter: Payer: Self-pay | Admitting: *Deleted

## 2018-09-13 ENCOUNTER — Encounter: Payer: Self-pay | Admitting: Gastroenterology

## 2018-09-13 DIAGNOSIS — R1084 Generalized abdominal pain: Secondary | ICD-10-CM

## 2018-09-13 DIAGNOSIS — R109 Unspecified abdominal pain: Secondary | ICD-10-CM

## 2018-09-13 MED ORDER — LIDOCAINE VISCOUS HCL 2 % MT SOLN: OROMUCOSAL | 5 refills | Status: DC

## 2018-09-13 MED ORDER — METRONIDAZOLE 500 MG PO TABS: 500.0000 mg | ORAL_TABLET | Freq: Two times a day (BID) | ORAL | 0 refills | Status: DC

## 2018-09-13 MED ORDER — CIPROFLOXACIN HCL 500 MG PO TABS: ORAL_TABLET | ORAL | 0 refills | Status: DC

## 2018-09-13 NOTE — Patient Instructions (Addendum)
DRINK WATER TO KEEP YOUR URINE LIGHT YELLOW.  FOLLOW A LOW FAT/DAIRY FREE DIET. MEATS SHOULD BE BAKED, BROILED, OR BOILED. AVOID fried foods. SEE HANDOUT.  CONTINUE PROTONIX. TAKE 30 MINUTES PRIOR TO BREAKFAST. CONTINUE TUMS WHEN NEEDED FOR PAIN RELIEF.  TAKE CIPRO AND FLAGYL TWICE DAILY FOR 5 DAYS. MEDICATION SIDE EFFECTS INCLUDE HEEL PAIN, NAUSEA, VOMITING. PATIENT SHOULD AVOID ALCOHOL AND COUGH SYRUP WITH ALCOHOL.   USE VISCOUS LIDOCAINE 2 TSP EVERY 4 HOURS WHEN NEEDED FOR FLARES OF HEARTBURN, CHEST PAIN, OR UPPER ABDOMINAL PAIN. USE A SYRINGE TO INJECT INTO THE BACK OF YOUR THROAT. USE NO MORE THAN 8 DOSES A DAY. IT WILL MAKE YOUR MOUTH, ESOPHAGUS, AND STOMACH NUMB.  COMPLETE CT SCAN. YOUR RESULTS WILL BE BACK IN 5 BUSINESS DAYS AFTER THE STUDY IS COMPLETE.  FOLLOW UP IN 3 MOS.   PLEASE CALL WITH QUESTIONS OR CONCERNS.  Low-Fat Diet BREADS, CEREALS, PASTA, RICE, DRIED PEAS, AND BEANS These products are high in carbohydrates and most are low in fat. Therefore, they can be increased in the diet as substitutes for fatty foods. They too, however, contain calories and should not be eaten in excess. Cereals can be eaten for snacks as well as for breakfast.  Include foods that contain fiber (fruits, vegetables, whole grains, and legumes). Research shows that fiber may lower blood cholesterol levels, especially the water-soluble fiber found in fruits, vegetables, oat products, and legumes. FRUITS AND VEGETABLES It is good to eat fruits and vegetables. Besides being sources of fiber, both are rich in vitamins and some minerals. They help you get the daily allowances of these nutrients. Fruits and vegetables can be used for snacks and desserts. MEATS Limit lean meat, chicken, [place], and fish to no more than 6 ounces per day. Beef, Pork, and Lamb Use lean cuts of beef, pork, and lamb. Lean cuts include:  Extra-lean ground beef.  Arm roast.  Sirloin tip.  Center-cut ham.  Round steak.  Loin  chops.  Rump roast.  Tenderloin.  Trim all fat off the outside of meats before cooking. It is not necessary to severely decrease the intake of red meat, but lean choices should be made. Lean meat is rich in protein and contains a highly absorbable form of iron. Premenopausal women, in particular, should avoid reducing lean red meat because this could increase the risk for low red blood cells (iron-deficiency anemia).  Chicken and [place] These are good sources of protein. The fat of poultry can be reduced by removing the skin and underlying fat layers before cooking. Chicken and [place] can be substituted for lean red meat in the diet. Poultry should not be fried or covered with high-fat sauces. Fish and Shellfish Fish is a good source of protein. Shellfish contain cholesterol, but they usually are low in saturated fatty acids. The preparation of fish is important. Like chicken and [place], they should not be fried or covered with high-fat sauces. EGGS Egg whites contain no fat or cholesterol. They can be eaten often. Try 1 to 2 egg whites instead of whole eggs in recipes or use egg substitutes that do not contain yolk.  MILK AND DAIRY PRODUCTS Use skim or 1% milk instead of 2% or whole milk. Decrease whole milk, natural, and processed cheeses. Use nonfat or low-fat (2%) cottage cheese or low-fat cheeses made from vegetable oils. Choose nonfat or low-fat (1 to 2%) yogurt. Experiment with evaporated skim milk in recipes that call for heavy cream. Substitute low-fat yogurt or low-fat cottage cheese  for sour cream in dips and salad dressings. Have at least 2 servings of low-fat dairy products, such as 2 glasses of skim (or 1%) milk each day to help get your daily calcium intake.  FATS AND OILS Butterfat, lard, and beef fats are high in saturated fat and cholesterol. These should be avoided.Vegetable fats do not contain cholesterol. AVOID coconut oil, palm oil, and palm kernel oil, WHICH are very high in  saturated fats. These should be limited. These fats are often used in bakery goods, processed foods, popcorn, oils, and nondairy creamers. Vegetable shortenings and some peanut butters contain hydrogenated oils, which are also saturated fats. Read the labels on these foods and check for saturated vegetable oils.  Desirable liquid vegetable oils are corn oil, cottonseed oil, olive oil, canola oil, safflower oil, soybean oil, and sunflower oil. Peanut oil is not as good, but small amounts are acceptable. Buy a heart-healthy tub margarine that has no partially hydrogenated oils in the ingredients. AVOID Mayonnaise and salad dressings often are made from unsaturated fats.  OTHER EATING TIPS Snacks  Most sweets should be limited as snacks. They tend to be rich in calories and fats, and their caloric content outweighs their nutritional value. Some good choices in snacks are graham crackers, melba toast, soda crackers, bagels (no egg), English muffins, fruits, and vegetables. These snacks are preferable to snack crackers, [place] fries, and chips. Popcorn should be air-popped or cooked in small amounts of liquid vegetable oil.  Desserts Eat fruit, low-fat yogurt, and fruit ices instead of pastries, cake, and cookies. Sherbet, angel food cake, gelatin dessert, frozen low-fat yogurt, or other frozen products that do not contain saturated fat (pure fruit juice bars, frozen ice pops) are also acceptable.   COOKING METHODS Choose those methods that use little or no fat. They include: Poaching.  Braising.  Steaming.  Grilling.  Baking.  Stir-frying.  Broiling.  Microwaving.  Foods can be cooked in a nonstick pan without added fat, or use a nonfat cooking spray in regular cookware. Limit fried foods and avoid frying in saturated fat. Add moisture to lean meats by using water, broth, cooking wines, and other nonfat or low-fat sauces along with the cooking methods mentioned above. Soups and stews should be  chilled after cooking. The fat that forms on top after a few hours in the refrigerator should be skimmed off. When preparing meals, avoid using excess salt. Salt can contribute to raising blood pressure in some people.  EATING AWAY FROM HOME Order entres, potatoes, and vegetables without sauces or butter. When meat exceeds the size of a deck of cards (3 to 4 ounces), the rest can be taken home for another meal. Choose vegetable or fruit salads and ask for low-calorie salad dressings to be served on the side. Use dressings sparingly. Limit high-fat toppings, such as bacon, crumbled eggs, cheese, sunflower seeds, and olives. Ask for heart-healthy tub margarine instead of butter.

## 2018-09-13 NOTE — Progress Notes (Signed)
NO PCP PER PATIENT °

## 2018-09-13 NOTE — Progress Notes (Signed)
PATIENT SCHEDULED  °

## 2018-09-13 NOTE — Assessment & Plan Note (Signed)
SYMPTOMS NOT CONTROLLED. DIFFERENTIAL DIAGNOSIS INCLUDES: SMALL INTESTINE BACTERIAL OVERGROWTH, MESENTERIC ISCHEMIA, LOW LIKELIHOOD OR RECURRENT LYMPHOMA IN THE SMALL BOWEL.  DRINK WATER TO KEEP YOUR URINE LIGHT YELLOW. FOLLOW A LOW FAT DIET. MEATS SHOULD BE BAKED, BROILED, OR BOILED. AVOID fried foods.  HANDOUT GIVEN. CONTINUE PROTONIX. TAKE 30 MINUTES PRIOR TO BREAKFAST. TUMS IF NEEDED. TAKE CIPRO AND FLAGYL TWICE DAILY FOR 5 DAYS. MEDICATION SIDE EFFECTS INCLUDE HEEL PAIN, NAUSEA, VOMITING. AVOID ALCOHOL AND COUGH SYRUP WITH ALCOHOL.  USE VISCOUS LIDOCAINE 2 TSP EVERY 4 HOURS WHEN NEEDED FOR FLARES OF HEARTBURN, CHEST PAIN, OR UPPER ABDOMINAL PAIN. USE A SYRINGE TO INJECT INTO THE BACK OF YOUR THROAT. USE NO MORE THAN 8 DOSES A DAY. IT WILL MAKE YOUR MOUTH, ESOPHAGUS, AND STOMACH NUMB. COMPLETE CT ANGIO. RESULTS WILL BE BACK IN 5 BUSINESS DAYS AFTER THE STUDY IS COMPLETE.  FOLLOW UP IN 3 MOS.   PLEASE CALL WITH QUESTIONS OR CONCERNS.

## 2018-09-13 NOTE — Progress Notes (Signed)
Subjective:    Patient ID: Robert Acevedo, male    DOB: 21-Oct-1970, 48 y.o.   MRN: [id number] Patient, No Pcp Per   HPI Having trouble with epigastric pain AFTER 6 IN SUB FROM [place] AND CAN BE SO BAD IT CAN BE SEVERE. ATE CEREAL AND APPLE AND HAD A LITTLE PAIN. TAKING GAS-X PILLS. THREW UP ONE TIME AND TASTED PILL IN HIS THROAT. JUST STARTED MULTIVITAMIN MAKE IT FEEL BETTER. DOESN'T EAT ONIONS. AVOIDS TOMATOES. STOPPED SMOKING CIGARETTES. GETS COLD. LAST COUPLE WEEKS HE EATS AND CAN'T EAT. BMs: CONSTIPATED(DIDN'T EAT MON, TUE AND ATE SOUPS/CRACKERS, ATE A LITTLE).  SOON AS FOOD HITS STOMACH IT HURTS. CIGS: QUIT SMOKING CIGARETTES 1.5PK/WEEK FOR 2 YEARS, NOW THC 2-3 TIMES A  DAY. HELPS HIM RELAX AND STIMULATES APPETITE. ATE [place] AND [place], BREAD: WHITE AND NOW HAS WHOLE WHEAT. AT TIMES MAY FEEL BLOATED. WEIGHT STABLE SINCE AUG 2019. TUMS HELPS AND FEELS RELIEF IF HE BURPS. NO DAIRY, MILK, AND CHEESE. ABDOMINAL PAIN AT HE BOTTOM MOST OF THE TIME BUT CAN BE IN RUQ AND LUQ.   PT DENIES FEVER, CHILLS, HEMATOCHEZIA, DYSURIA, HEMATURIA, HEMATEMESIS, nausea, vomiting, melena, diarrhea, CHEST PAIN, SHORTNESS OF BREATH, CHANGE IN BOWEL IN HABITS, problems swallowing, problems with sedation, OR heartburn or indigestion.  Past Medical History:  Diagnosis Date  . Bronchitis   . Hodgkin's lymphoma ([place])   . Lymphadenopathy, inguinal    left   Past Surgical History:  Procedure Laterality Date  . BONE MARROW ASPIRATION Left 01/20/15  . BONE MARROW BIOPSY Left 01/20/15  . COLONOSCOPY N/A 06/16/2018   Procedure: COLONOSCOPY;  Surgeon: Danie Binder, MD;  [facility];  Service: Endoscopy;  Laterality: N/A;  1:15pm  . ESOPHAGOGASTRODUODENOSCOPY N/A 06/16/2018   Procedure: ESOPHAGOGASTRODUODENOSCOPY (EGD);  Surgeon: Danie Binder, MD;  [facility];  Service: Endoscopy;  Laterality: N/A;  . INCISION AND DRAINAGE PERITONSILLAR ABSCESS    . KNEE SURGERY    . LYMPH NODE  BIOPSY Left 01/13/2015   Procedure: LEFT INGUINAL LYMPH NODE BIOPSY;  Surgeon: Aviva Signs Md, MD;  Location: [facility];  Service: General;  Laterality: Left;  . PORT-A-CATH REMOVAL Left 09/08/2016   Procedure: MINOR REMOVAL PORT-A-CATH;  Surgeon: Aviva Signs, MD;  Location: [facility];  Service: General;  Laterality: Left;  . PORTACATH PLACEMENT Left 01/31/2015   Procedure: INSERTION PORT-A-CATH;  Surgeon: Aviva Signs Md, MD;  Location: [facility];  Service: General;  Laterality: Left;  . WRIST SURGERY     right   Allergies  Allergen Reactions  . Lactose Intolerance (Gi)     Gi distress   . Clindamycin/Lincomycin Rash   Current Outpatient Medications  Medication Sig    . Calcium Carbonate Antacid (TUMS PO) Take 2 tablets by mouth as needed.    . Multiple Vitamin (MULTIVITAMIN WITH MINERALS) TABS tablet Take 1 tablet by mouth daily.    . pantoprazole (PROTONIX) 40 MG tablet TAKE 1 TABLET BY MOUTH ONCE DAILY BEFORE BREAKFAST    . Simethicone (GAS-X PO) Take by mouth as needed.    . traMADol (ULTRAM) 50 MG tablet Take 1 tablet (50 mg total) by mouth every 6 (six) hours as needed.    .       Review of Systems PER HPI OTHERWISE ALL SYSTEMS ARE NEGATIVE.     Objective:   Physical Exam Vitals signs reviewed.  Constitutional:      General: He is not in acute distress.    Appearance: He is  well-developed.  HENT:     Head: Normocephalic and atraumatic.     Mouth/Throat:     Pharynx: No oropharyngeal exudate.  Eyes:     General: No scleral icterus.    Pupils: Pupils are equal, round, and reactive to light.  Neck:     Musculoskeletal: Normal range of motion and neck supple.  Cardiovascular:     Rate and Rhythm: Normal rate and regular rhythm.     Heart sounds: Normal heart sounds.  Pulmonary:     Effort: Pulmonary effort is normal. No respiratory distress.     Breath sounds: Normal breath sounds.  Abdominal:     General: Bowel sounds are normal. There is no distension.     Palpations:  Abdomen is soft.     Tenderness: There is no abdominal tenderness. There is no guarding or rebound.  Musculoskeletal: Normal range of motion.        General: No swelling.  Lymphadenopathy:     Cervical: No cervical adenopathy.  Neurological:     Mental Status: He is alert and oriented to person, place, and time. Mental status is at baseline.     Comments: NO  NEW FOCAL DEFICITS  Psychiatric:        Behavior: Behavior normal.     Comments: PRESSURED SPEECH, anxious mood       Assessment & Plan:

## 2018-09-27 ENCOUNTER — Ambulatory Visit (HOSPITAL_COMMUNITY): Payer: Self-pay

## 2018-10-18 ENCOUNTER — Ambulatory Visit: Payer: Self-pay | Admitting: Gastroenterology

## 2018-10-26 ENCOUNTER — Telehealth: Payer: Self-pay | Admitting: Gastroenterology

## 2018-10-26 ENCOUNTER — Ambulatory Visit (HOSPITAL_COMMUNITY)
Admission: RE | Admit: 2018-10-26 | Discharge: 2018-10-26 | Disposition: A | Discharge status: Home / Self Care | Payer: Self-pay | Source: Ambulatory Visit | Attending: Gastroenterology | Admitting: Gastroenterology

## 2018-10-26 DIAGNOSIS — R109 Unspecified abdominal pain: Secondary | ICD-10-CM | POA: Insufficient documentation

## 2018-10-26 MED ORDER — PANTOPRAZOLE SODIUM 40 MG PO TBEC: DELAYED_RELEASE_TABLET | ORAL | 3 refills | Status: AC

## 2018-10-26 MED ORDER — IOPAMIDOL (ISOVUE-370) INJECTION 76%
100.0000 mL | Freq: Once | INTRAVENOUS | Status: AC | PRN
  Administered 2018-10-26: 100 mL via INTRAVENOUS

## 2018-10-26 NOTE — Telephone Encounter (Signed)
PT is aware and he needs refill on Protonix sent to pharmacy.

## 2018-10-26 NOTE — Telephone Encounter (Signed)
PLEASE CALL PT. HIS CT SHOWS NO ACUTE INTRAABDOMINAL PROCESS. HIS EPIGASTRIC PAIN IS MOST LIKELY DUE TO GERD/GASTRITIS.  DRINK WATER TO KEEP YOUR URINE LIGHT YELLOW.  FOLLOW A LOW FAT/DAIRY FREE DIET. MEATS SHOULD BE BAKED, BROILED, OR BOILED. AVOID fried foods.   CONTINUE PROTONIX. TAKE 30 MINUTES PRIOR TO BREAKFAST. CONTINUE TUMS WHEN NEEDED FOR PAIN RELIEF.  USE VISCOUS LIDOCAINE 2 TSP EVERY 4 HOURS WHEN NEEDED FOR FLARES OF HEARTBURN, CHEST PAIN, OR UPPER ABDOMINAL PAIN.   FOLLOW UP IN APR 2020.

## 2018-10-26 NOTE — Telephone Encounter (Signed)
LMOM that the Rx has been sent in.

## 2018-10-26 NOTE — Addendum Note (Signed)
Addended by: Danie Binder on: 10/26/2018 10:38 AM   Modules accepted: Orders

## 2018-10-26 NOTE — Telephone Encounter (Signed)
PLEASE CALL PT.  Rx sent FOR 90 DAYS WITH REFILLS FOR ON EYEAR.

## 2018-10-31 NOTE — Telephone Encounter (Signed)
PATIENT SCHEDULED  °

## 2018-12-07 ENCOUNTER — Other Ambulatory Visit: Payer: Self-pay

## 2018-12-07 ENCOUNTER — Encounter: Payer: Self-pay | Admitting: Gastroenterology

## 2018-12-07 ENCOUNTER — Ambulatory Visit (INDEPENDENT_AMBULATORY_CARE_PROVIDER_SITE_OTHER): Payer: Self-pay | Admitting: Gastroenterology

## 2018-12-07 DIAGNOSIS — R1013 Epigastric pain: Secondary | ICD-10-CM

## 2018-12-07 NOTE — Progress Notes (Signed)
ON RECALL  °

## 2018-12-07 NOTE — Patient Instructions (Signed)
DRINK WATER TO KEEP YOUR URINE LIGHT YELLOW.  AVOID TRIGGERS FOR REFLUX. SEE INFO BELOW.  CONTINUE PROTONIX. TAKE 30 MINUTES PRIOR TO YOUR FIRST MEAL.  FOLLOW UP IN 6 MOS. PLEASE CALL WITH QUESTIONS OR CONCERNS.   Lifestyle and home remedies TO CONTROL HEARTBURN You may eliminate or reduce the frequency of heartburn by making the following lifestyle changes:  . Control your weight. Being overweight is a major risk factor for heartburn and GERD. Excess pounds put pressure on your abdomen, pushing up your stomach and causing acid to back up into your esophagus.   . Eat smaller meals. 4 TO 6 MEALS A DAY. This reduces pressure on the lower esophageal sphincter, helping to prevent the valve from opening and acid from washing back into your esophagus.   Dolphus Jenny your belt. Clothes that fit tightly around your waist put pressure on your abdomen and the lower esophageal sphincter.   . Eliminate heartburn triggers. Everyone has specific triggers. Common triggers such as fatty or fried foods, spicy food, tomato sauce, carbonated beverages, alcohol, chocolate, mint, garlic, onion, caffeine and nicotine may make heartburn worse.   Marland Kitchen Avoid stooping or bending. Tying your shoes is OK. Bending over for longer periods to weed your garden isn't, especially soon after eating.   . Don't lie down after a meal. Wait at least three to four hours after eating before going to bed, and don't lie down right after eating.   Alternative medicine . Several home remedies exist for treating GERD, but they provide only temporary relief. They include drinking baking soda (sodium bicarbonate) added to water or drinking other fluids such as baking soda mixed with cream of tartar and water. . Although these liquids create temporary relief by neutralizing, washing away or buffering acids, eventually they aggravate the situation by adding gas and fluid to your stomach, increasing pressure and causing more acid reflux. Further,  adding more sodium to your diet may increase your blood pressure and add stress to your heart, and excessive bicarbonate ingestion can alter the acid-base balance in your body.

## 2018-12-07 NOTE — Progress Notes (Signed)
No pcp per patient 

## 2018-12-07 NOTE — Assessment & Plan Note (Signed)
MOST LIKELY DUE TO GASTRITIS/GERD. RESOLVED ON PROTONIX DAILY.   DRINK WATER TO KEEP YOUR URINE LIGHT YELLOW. AVOID TRIGGERS FOR REFLUX.  HANDOUT GIVEN. CONTINUE PROTONIX. TAKE 30 MINUTES PRIOR TO YOUR FIRST MEAL. FOLLOW UP IN 6 MOS. PLEASE CALL WITH QUESTIONS OR CONCERNS.

## 2018-12-07 NOTE — Progress Notes (Addendum)
Subjective:    Primary Care Physician:  Patient, No Pcp Per  Primary GI:  Barney Drain, MD   Patient Location: home   Provider Location: [place] office   Reason for Visit: [place] present on the virtual encounter, with roles: patient, myself (provider), MARTINA BOOTH CMA (update meds/allergies)   Total time (minutes) spent on medical discussion: 10 MINUTES   Due to COVID-19, visit was VIA TELEPHONE VISIT DUE TO COVID 19. VISIT IS CONDUCTED VIRTUALLY AND WAS REQUESTED BY PATIENT.   Virtual Visit via TELEPHONE   I connected with  Robert Acevedo @ [id number] and verified that I am speaking with the correct person using two identifiers.   I discussed the limitations, risks, security and privacy concerns of performing an evaluation and management service by telephone/video and the availability of in person appointments. I also discussed with the patient that there may be a patient responsible charge related to this service. The patient expressed understanding and agreed to proceed.     Patient ID: Robert Acevedo, male    DOB: 1971/03/18, 48 y.o.   MRN: [id number] Patient, No Pcp Per  HPI [place]. CALLED PT ON HIS CELL. LVM: [id number]. AVOIDING CHEESE AND GREASY FOODS. HASN'T GAINED WEIGHT BUT APPETITE IS PICKING UP. ABLE TO EAT MORE. FEELS SWEATING WHEN HE GETS UP IN THE AM.   PT DENIES FEVER, CHILLS, HEMATOCHEZIA, HEMATEMESIS, nausea, vomiting, melena, diarrhea, CHEST PAIN, SHORTNESS OF BREATH,  CHANGE IN BOWEL IN HABITS, constipation, abdominal pain, problems swallowing, OR heartburn or indigestion.   Past Medical History:  Diagnosis Date  . Bronchitis   . Hodgkin's lymphoma ([place])   . Lymphadenopathy, inguinal    left   Past Surgical History:  Procedure Laterality Date  . BONE MARROW ASPIRATION Left 01/20/15  . BONE MARROW BIOPSY Left 01/20/15  . COLONOSCOPY N/A 06/16/2018   Procedure: COLONOSCOPY;  Surgeon: Danie Binder, MD;  [facility];  Service: Endoscopy;  Laterality: N/A;  1:15pm  . ESOPHAGOGASTRODUODENOSCOPY N/A 06/16/2018   Procedure: ESOPHAGOGASTRODUODENOSCOPY (EGD);  Surgeon: Danie Binder, MD;  [facility];  Service: Endoscopy;  Laterality: N/A;  . INCISION AND DRAINAGE PERITONSILLAR ABSCESS    . KNEE SURGERY    . LYMPH NODE BIOPSY Left 01/13/2015   Procedure: LEFT INGUINAL LYMPH NODE BIOPSY;  Surgeon: Aviva Signs Md, MD;  Location: [facility];  Service: General;  Laterality: Left;  . PORT-A-CATH REMOVAL Left 09/08/2016   Procedure: MINOR REMOVAL PORT-A-CATH;  Surgeon: Aviva Signs, MD;  Location: [facility];  Service: General;  Laterality: Left;  . PORTACATH PLACEMENT Left 01/31/2015   Procedure: INSERTION PORT-A-CATH;  Surgeon: Aviva Signs Md, MD;  Location: [facility];  Service: General;  Laterality: Left;  . WRIST SURGERY     right   Allergies  Allergen Reactions  . Lactose Intolerance (Gi)     Gi distress   . Clindamycin/Lincomycin Rash    Current Outpatient Medications  Medication Sig    . Calcium Carbonate Antacid (TUMS PO) Take 2 tablets by mouth as needed.    . Multiple Vitamin (MULTIVITAMIN WITH MINERALS) TABS tablet Take 1 tablet by mouth daily.    . ondansetron (ZOFRAN) 4 MG tablet Take 1 tablet (4 mg total) by mouth every 6 (six) hours as needed.    . pantoprazole (PROTONIX) 40 MG tablet 1 po 30 mins prior to [place]    . ciprofloxacin (CIPRO) 500 MG tablet 1 PO BID FOR  5 DAYS (Patient not taking: Reported on 12/07/2018)    .      .      . Simethicone (GAS-X PO) Take by mouth as needed.    .        Review of Systems PER HPI OTHERWISE ALL SYSTEMS ARE NEGATIVE.    Objective:   Physical Exam   TELEPHONE VISIT DUE TO COVID 19, VISIT IS CONDUCTED VIRTUALLY AND WAS REQUESTED BY PATIENT.     Assessment & Plan:

## 2019-01-05 ENCOUNTER — Inpatient Hospital Stay (HOSPITAL_COMMUNITY): Payer: Self-pay | Attending: Hematology

## 2019-01-05 ENCOUNTER — Other Ambulatory Visit: Payer: Self-pay

## 2019-01-05 DIAGNOSIS — C8195 Hodgkin lymphoma, unspecified, lymph nodes of inguinal region and lower limb: Secondary | ICD-10-CM | POA: Insufficient documentation

## 2019-01-05 DIAGNOSIS — D890 Polyclonal hypergammaglobulinemia: Secondary | ICD-10-CM | POA: Insufficient documentation

## 2019-01-05 DIAGNOSIS — F17210 Nicotine dependence, cigarettes, uncomplicated: Secondary | ICD-10-CM | POA: Insufficient documentation

## 2019-01-05 DIAGNOSIS — C8190 Hodgkin lymphoma, unspecified, unspecified site: Secondary | ICD-10-CM

## 2019-01-05 LAB — TSH: TSH: 0.474 u[IU]/mL (ref 0.350–4.500)

## 2019-01-05 LAB — CBC WITH DIFFERENTIAL/PLATELET
Abs Immature Granulocytes: 0 10*3/uL (ref 0.00–0.07)
Basophils Absolute: 0 10*3/uL (ref 0.0–0.1)
Basophils Relative: 1 %
Eosinophils Absolute: 0.1 10*3/uL (ref 0.0–0.5)
Eosinophils Relative: 1 %
HCT: 39.9 % (ref 39.0–52.0)
Hemoglobin: 12.8 g/dL — ABNORMAL LOW (ref 13.0–17.0)
Immature Granulocytes: 0 %
Lymphocytes Relative: 40 %
Lymphs Abs: 1.7 10*3/uL (ref 0.7–4.0)
MCH: 29 pg (ref 26.0–34.0)
MCHC: 32.1 g/dL (ref 30.0–36.0)
MCV: 90.5 fL (ref 80.0–100.0)
Monocytes Absolute: 0.6 10*3/uL (ref 0.1–1.0)
Monocytes Relative: 15 %
Neutro Abs: 1.8 10*3/uL (ref 1.7–7.7)
Neutrophils Relative %: 43 %
Platelets: 169 10*3/uL (ref 150–400)
RBC: 4.41 MIL/uL (ref 4.22–5.81)
RDW: 13.8 % (ref 11.5–15.5)
WBC: 4.3 10*3/uL (ref 4.0–10.5)
nRBC: 0 % (ref 0.0–0.2)

## 2019-01-05 LAB — COMPREHENSIVE METABOLIC PANEL
ALT: 25 U/L (ref 0–44)
AST: 32 U/L (ref 15–41)
Albumin: 3.8 g/dL (ref 3.5–5.0)
Alkaline Phosphatase: 57 U/L (ref 38–126)
Anion gap: 6 (ref 5–15)
BUN: 15 mg/dL (ref 6–20)
CO2: 27 mmol/L (ref 22–32)
Calcium: 9.1 mg/dL (ref 8.9–10.3)
Chloride: 105 mmol/L (ref 98–111)
Creatinine, Ser: 1.15 mg/dL (ref 0.61–1.24)
GFR calc Af Amer: >60 mL/min (ref >60)
GFR calc non Af Amer: >60 mL/min (ref >60)
Glucose, Bld: 118 mg/dL — ABNORMAL HIGH (ref 70–99)
Potassium: 3.8 mmol/L (ref 3.5–5.1)
Sodium: 138 mmol/L (ref 135–145)
Total Bilirubin: 0.8 mg/dL (ref 0.3–1.2)
Total Protein: 8.6 g/dL — ABNORMAL HIGH (ref 6.5–8.1)

## 2019-01-05 LAB — LACTATE DEHYDROGENASE: LDH: 157 U/L (ref 98–192)

## 2019-01-05 LAB — SEDIMENTATION RATE: Sed Rate: 35 mm/hr — ABNORMAL HIGH (ref 0–16)

## 2019-01-06 LAB — IGG, IGA, IGM
IgA: 338 mg/dL (ref 90–386)
IgG (Immunoglobin G), Serum: 2821 mg/dL — ABNORMAL HIGH (ref 603–1613)
IgM (Immunoglobulin M), Srm: 150 mg/dL (ref 20–172)

## 2019-01-08 LAB — PROTEIN ELECTROPHORESIS, SERUM
A/G Ratio: 0.7 (ref 0.7–1.7)
Albumin ELP: 3.5 g/dL (ref 2.9–4.4)
Alpha-1-Globulin: 0.2 g/dL (ref 0.0–0.4)
Alpha-2-Globulin: 0.8 g/dL (ref 0.4–1.0)
Beta Globulin: 1.1 g/dL (ref 0.7–1.3)
Gamma Globulin: 2.8 g/dL — ABNORMAL HIGH (ref 0.4–1.8)
Globulin, Total: 5 g/dL — ABNORMAL HIGH (ref 2.2–3.9)
Total Protein ELP: 8.5 g/dL (ref 6.0–8.5)

## 2019-01-08 LAB — IMMUNOFIXATION ELECTROPHORESIS
IgA: 342 mg/dL (ref 90–386)
IgG (Immunoglobin G), Serum: 2897 mg/dL — ABNORMAL HIGH (ref 603–1613)
IgM (Immunoglobulin M), Srm: 152 mg/dL (ref 20–172)
Total Protein ELP: 8.4 g/dL (ref 6.0–8.5)

## 2019-01-08 LAB — KAPPA/LAMBDA LIGHT CHAINS
Kappa free light chain: 56.7 mg/L — ABNORMAL HIGH (ref 3.3–19.4)
Kappa, lambda light chain ratio: 2.11 — ABNORMAL HIGH (ref 0.26–1.65)
Lambda free light chains: 26.9 mg/L — ABNORMAL HIGH (ref 5.7–26.3)

## 2019-01-12 ENCOUNTER — Encounter (HOSPITAL_COMMUNITY): Payer: Self-pay | Admitting: Hematology

## 2019-01-12 ENCOUNTER — Inpatient Hospital Stay (HOSPITAL_BASED_OUTPATIENT_CLINIC_OR_DEPARTMENT_OTHER): Payer: Self-pay | Admitting: Hematology

## 2019-01-12 ENCOUNTER — Other Ambulatory Visit: Payer: Self-pay

## 2019-01-12 VITALS — BP 130/93 | HR 95 | Temp 98.0°F | Resp 18 | Wt 168.0 lb

## 2019-01-12 DIAGNOSIS — F17210 Nicotine dependence, cigarettes, uncomplicated: Secondary | ICD-10-CM

## 2019-01-12 DIAGNOSIS — C8195 Hodgkin lymphoma, unspecified, lymph nodes of inguinal region and lower limb: Secondary | ICD-10-CM

## 2019-01-12 DIAGNOSIS — D890 Polyclonal hypergammaglobulinemia: Secondary | ICD-10-CM

## 2019-01-12 DIAGNOSIS — C8190 Hodgkin lymphoma, unspecified, unspecified site: Secondary | ICD-10-CM

## 2019-01-12 NOTE — Assessment & Plan Note (Signed)
1.  Stage IIIb Hodgkin's lymphoma: -Diagnosed on 01/13/2015 by left inguinal lymph node biopsy, negative bone marrow biopsy, treated with 6 cycles of ABVD completed on 07/08/2015. -Restaging PET CT scan following 2 cycles showed complete response.  - Today's physical examination did not reveal any adenopathy or splenomegaly.  He has very mild night sweats for the past 1 year which are stable.  He does not report any fevers or weight loss.  He is continuing to work full-time job. - He follows up with Dr. Oneida Alar for his gastritis.  He was also evaluated by our nutritionist for weight gain recommendations. -I have suggested him to drink Ensure/boost clear daily. -I will see him back in 6 months for follow-up.  2.  Polyclonal gammopathy: - Found to have multiple lytic lesions in the pelvis at the time of diagnosis on PET CT scan in May 2016. -Biopsy of the left iliac crest lytic lesion on 11/16/2015 shows normal bone marrow with no evidence of myeloma. - I have also reviewed his SPEP and immunofixation which are normal.  Free light chain ratio is elevated with elevated kappa and lambda light chains, which could be from polyclonal gammopathy. -I plan to repeat them in 6 months.

## 2019-01-12 NOTE — Progress Notes (Signed)
[place], [place] [address]   CLINIC:  Medical Oncology/Hematology  PCP:  Patient, No Pcp Per No address on file None   REASON FOR VISIT:  Follow-up for hodgkins lymphoma  CURRENT THERAPY: Observation   BRIEF ONCOLOGIC HISTORY:    Hodgkin lymphoma ([place])   01/13/2015 Initial Biopsy    L inguinal LN biopsy on 01/13/2015 with Classical Hodgkin Lymphoma, nodular sclerosing type    01/20/2015 Bone Marrow Biopsy    normal BMBX, mild polyclonal plasmacytosis    01/28/2015 Imaging    PET/CT Mild hyper metabolism is associated with the borderline enlarged bilateral axillary and mediastinal lymph nodes. There is more intense FDG uptake associated with bilateral inguinal lymph nodes. focal area of peripheral consolidation in the lingula.    01/29/2015 Imaging    MUGA with EF 69%    01/29/2015 Imaging    1.spirometry is normal .no change with bronchodilator. lung volumes are normal. airway resistance somewhat high. DLCO normal decreased MIP/MEP may indicate weakness of the bellows function of the lung.     02/04/2015 - 07/08/2015 Chemotherapy    ABVD x 6 cycles    03/28/2015 PET scan    No adenopathy or abnormal hypermetabolic activity along the previous axillary, mediastinal, and inguinal lymph node chains. [place] 1.    03/31/2015 Imaging    MUGA- Normal LEFT ventricular ejection fraction of 70%, not significantly changed from the 69% calculated on the previous study of 01/29/2015.    05/30/2015 Imaging    MUGA- No significant change in ejection fraction equal 73%    07/29/2015 Imaging    PET/CT no specific evidence of recurrent metabolically active Hodgkin's    07/29/2015 Remission       09/10/2015 Imaging    MRI pelvis- Multiple stable lytic slightly enhancing lesions in the iliac bones and sacrum, unchanged since 12/20/2014. Even though biopsy was negative for myeloma in May 2016, I think the patient remains at increased risk for development of  myeloma.    11/26/2015 Pathology Results    BMBX negative, polyclonal plasmacytosis noted    01/05/2016 Imaging    CT neck- No evidence of recurrent lymphoma in the neck.    01/05/2016 Imaging    CT CAP- Small bilateral axillary nodes and AP window mediastinal node, within normal limits and non FDG avid on prior PET. No suspicious lymphadenopathy in the chest, abdomen, or pelvis by CT size criteria. Spleen is normal in size.     08/05/2016 Imaging    Stable exam. No evidence of recurrent lymphoma or other acute findings.    03/24/2017 Imaging    CT CAP- Numerous shotty lymph nodes in the mediastinum, bilateral axilla, left subpectoral chest wall, and bilateral inguinal regions, the largest measuring up to 10 mm in short axis. These are nonspecific, however lymphoma recurrence cannot be excluded.  Numerous solid and ground-glass pulmonary nodules. The largest 6.6 mm ground-glass pulmonary nodule in the left upper lobe is stable from 2016. Area of airspace consolidation, round atelectasis or pulmonary nodule in the right middle lobe measures 13 mm. Non-contrast chest CT at 3-6 months is recommended. If the nodules are stable at time of repeat CT, then future CT at 18-24 months (from today's scan) is considered optional for low-risk patients, but is recommended for high-risk patients. This recommendation follows the consensus statement: Guidelines for Management of Incidental Pulmonary Nodules Detected on CT Images: From the Fleischner Society 2017; Radiology 2017; 284:228-243.  Stable appearance of known bilateral  pelvic lytic osseous lesions.    04/15/2017 Imaging    CT abd/pelvis- 1. Negative for bowel obstruction or significant bowel wall thickening. 2. 8 x 11 mm vague hypodense focus within the liver, not clearly seen on prior studies. Suggest correlation with nonemergent MRI. 3. Trace fluid in the pelvis      CANCER STAGING: Cancer Staging Hodgkin lymphoma [facility]) Staging  form: Lymphoid Neoplasms, AJCC 6th Edition - Clinical stage from 03/04/2015: Stage III - Signed by Baird Cancer, PA-C on 11/04/2015    INTERVAL HISTORY:  Mr. Blasius 48 y.o. male returns for routine follow-up. He is here today alone. He states that he has some numbness in his hands. He states that he has experienced night sweats for the past year. He states that he has not been able to gain weight. He denies any itching after bathing of alcohol consumption. Denies any nausea, vomiting, or diarrhea. Denies any new pains. Had not noticed any recent bleeding such as epistaxis, hematuria or hematochezia. Denies recent chest pain on exertion, shortness of breath on minimal exertion, pre-syncopal episodes, or palpitations. Denies any numbness or tingling in  feet. Denies any recent fevers, infections, or recent hospitalizations. Patient reports appetite at 50% and energy level at 75%.   REVIEW OF SYSTEMS:  Review of Systems  Neurological: Positive for numbness.  Psychiatric/Behavioral: Positive for sleep disturbance.     PAST MEDICAL/SURGICAL HISTORY:  Past Medical History:  Diagnosis Date  . Bronchitis   . Hodgkin's lymphoma ([place])   . Lymphadenopathy, inguinal    left   Past Surgical History:  Procedure Laterality Date  . BONE MARROW ASPIRATION Left 01/20/15  . BONE MARROW BIOPSY Left 01/20/15  . COLONOSCOPY N/A 06/16/2018   Procedure: COLONOSCOPY;  Surgeon: Danie Binder, MD;  [facility];  Service: Endoscopy;  Laterality: N/A;  1:15pm  . ESOPHAGOGASTRODUODENOSCOPY N/A 06/16/2018   Procedure: ESOPHAGOGASTRODUODENOSCOPY (EGD);  Surgeon: Danie Binder, MD;  Location: [facility];  Service: Endoscopy;  Laterality: N/A;  . INCISION AND DRAINAGE PERITONSILLAR ABSCESS    . KNEE SURGERY    . LYMPH NODE BIOPSY Left 01/13/2015   Procedure: LEFT INGUINAL LYMPH NODE BIOPSY;  Surgeon: Aviva Signs Md, MD;  Location: [facility] ORS;  Service: General;  Laterality: Left;  . PORT-A-CATH  REMOVAL Left 09/08/2016   Procedure: MINOR REMOVAL PORT-A-CATH;  Surgeon: Aviva Signs, MD;  Location: [facility] ORS;  Service: General;  Laterality: Left;  . PORTACATH PLACEMENT Left 01/31/2015   Procedure: INSERTION PORT-A-CATH;  Surgeon: Aviva Signs Md, MD;  Location: [facility] ORS;  Service: General;  Laterality: Left;  . WRIST SURGERY     right     SOCIAL HISTORY:  Social History   Socioeconomic History  . Marital status: Single    Spouse name: Not on file  . Number of children: Not on file  . Years of education: Not on file  . Highest education level: Not on file  Occupational History  . Not on file  Social Needs  . Financial resource strain: Not on file  . Food insecurity:    Worry: Not on file    Inability: Not on file  . Transportation needs:    Medical: Not on file    Non-medical: Not on file  Tobacco Use  . Smoking status: Current Every Day Smoker    Packs/day: 0.50    Years: 2.00    Pack years: 1.00    Types: Cigarettes  . Smokeless tobacco: Never Used  Substance and  Sexual Activity  . Alcohol use: Yes    Comment: occasional  . Drug use: Yes    Types: Marijuana    Comment: sometimes  . Sexual activity: Yes    Birth control/protection: None  Lifestyle  . Physical activity:    Days per week: Not on file    Minutes per session: Not on file  . Stress: Not on file  Relationships  . Social connections:    Talks on phone: Not on file    Gets together: Not on file    Attends religious service: Not on file    Active member of club or organization: Not on file    Attends meetings of clubs or organizations: Not on file    Relationship status: Not on file  . Intimate partner violence:    Fear of current or ex partner: Not on file    Emotionally abused: Not on file    Physically abused: Not on file    Forced sexual activity: Not on file  Other Topics Concern  . 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 Seizures Mother   . Diabetes Father     CURRENT MEDICATIONS:  Outpatient Encounter Medications as of 01/12/2019  Medication Sig  . Calcium Carbonate Antacid (TUMS PO) Take 2 tablets by mouth as needed.  . Multiple Vitamin (MULTIVITAMIN WITH MINERALS) TABS tablet Take 1 tablet by mouth daily.  . ondansetron (ZOFRAN) 4 MG tablet Take 1 tablet (4 mg total) by mouth every 6 (six) hours as needed.  . pantoprazole (PROTONIX) 40 MG tablet 1 po 30 mins prior to [place]  . Simethicone (GAS-X PO) Take by mouth as needed.  . traMADol (ULTRAM) 50 MG tablet Take 1 tablet (50 mg total) by mouth every 6 (six) hours as needed. (Patient not taking: Reported on 12/07/2018)  . [DISCONTINUED] ciprofloxacin (CIPRO) 500 MG tablet 1 PO BID FOR 5 DAYS (Patient not taking: Reported on 12/07/2018)  . [DISCONTINUED] lidocaine (XYLOCAINE) 2 % solution 2 TSP PO Q4H PRN FOR UPPER ABDOMINAL/CHEST PAIN. NO MORE> 8 DOSES/DAY. (Patient not taking: Reported on 12/07/2018)  . [DISCONTINUED] metroNIDAZOLE (FLAGYL) 500 MG tablet Take 1 tablet (500 mg total) by mouth 2 (two) times daily. FOR 5 DAYS (Patient not taking: Reported on 12/07/2018)   No facility-administered encounter medications on file as of 01/12/2019.     ALLERGIES:  Allergies  Allergen Reactions  . Lactose Intolerance (Gi)     Gi distress   . Clindamycin/Lincomycin Rash     PHYSICAL EXAM:  ECOG Performance status: 0  Vitals:   01/12/19 1133  BP: (!) 130/93  Pulse: 95  Resp: 18  Temp: 98 F (36.7 C)  SpO2: 100%   Filed Weights   01/12/19 1133  Weight: 168 lb (76.2 kg)    Physical Exam Vitals signs reviewed.  Constitutional:      Appearance: Normal appearance.  Cardiovascular:     Rate and Rhythm: Normal rate and regular rhythm.     Heart sounds: Normal heart sounds.  Pulmonary:     Effort: Pulmonary effort is normal.     Breath sounds: Normal  breath sounds.  Abdominal:     General: There is no distension.     Palpations: Abdomen is soft. There is no mass.  Musculoskeletal:  General: No swelling.  Lymphadenopathy:     Cervical: No cervical adenopathy.  Skin:    General: Skin is warm.  Neurological:     General: No focal deficit present.     Mental Status: He is alert and oriented to person, place, and time.  Psychiatric:        Mood and Affect: Mood normal.        Behavior: Behavior normal.      LABORATORY DATA:  I have reviewed the labs as listed.  CBC    Component Value Date/Time   WBC 4.3 01/05/2019 1128   RBC 4.41 01/05/2019 1128   HGB 12.8 (L) 01/05/2019 1128   HCT 39.9 01/05/2019 1128   PLT 169 01/05/2019 1128   MCV 90.5 01/05/2019 1128   MCH 29.0 01/05/2019 1128   MCHC 32.1 01/05/2019 1128   RDW 13.8 01/05/2019 1128   LYMPHSABS 1.7 01/05/2019 1128   MONOABS 0.6 01/05/2019 1128   EOSABS 0.1 01/05/2019 1128   BASOSABS 0.0 01/05/2019 1128   CMP Latest Ref Rng & Units 01/05/2019 09/06/2018 06/30/2018  Glucose 70 - 99 mg/dL 118(H) 108(H) 110(H)  BUN 6 - 20 mg/dL _0 Creatinine 0.61 - 1.24 mg/dL 1.15 1.10 1.10  Sodium 135 - 145 mmol/L 138 135 139  Potassium 3.5 - 5.1 mmol/L 3.8 3.7 3.8  Chloride 98 - 111 mmol/L 105 100 106  CO2 22 - 32 mmol/L _1 Calcium 8.9 - 10.3 mg/dL 9.1 9.0 9.0  Total Protein 6.5 - 8.1 g/dL 8.6(H) 8.9(H) 8.8(H)  Total Bilirubin 0.3 - 1.2 mg/dL 0.8 0.7 0.9  Alkaline Phos 38 - 126 U/L 57 45 47  AST 15 - 41 U/L 32 22 24  ALT 0 - 44 U/L _2 DIAGNOSTIC IMAGING:  I have independently reviewed the scans and discussed with the patient.   I have reviewed Venita Lick LPN's note and agree with the documentation.  I personally performed a face-to-face visit, made revisions and my assessment and plan is as follows.    ASSESSMENT & PLAN:   Hodgkin lymphoma 1.  Stage IIIb Hodgkin's lymphoma: -Diagnosed on 01/13/2015 by left inguinal lymph node biopsy,  negative bone marrow biopsy, treated with 6 cycles of ABVD completed on 07/08/2015. -Restaging PET CT scan following 2 cycles showed complete response.  - Today's physical examination did not reveal any adenopathy or splenomegaly.  He has very mild night sweats for the past 1 year which are stable.  He does not report any fevers or weight loss.  He is continuing to work full-time job. - He follows up with Dr. Oneida Alar for his gastritis.  He was also evaluated by our nutritionist for weight gain recommendations. -I have suggested him to drink Ensure/boost clear daily. -I will see him back in 6 months for follow-up.  2.  Polyclonal gammopathy: - Found to have multiple lytic lesions in the pelvis at the time of diagnosis on PET CT scan in May 2016. -Biopsy of the left iliac crest lytic lesion on 11/16/2015 shows normal bone marrow with no evidence of myeloma. - I have also reviewed his SPEP and immunofixation which are normal.  Free light chain ratio is elevated with elevated kappa and lambda light chains, which could be from polyclonal gammopathy. -I plan to repeat them in 6 months.       Orders placed this encounter:  Orders Placed This Encounter  Procedures  . CBC with Differential/Platelet  .  Comprehensive metabolic panel  . Lactate dehydrogenase  . Kappa/lambda light chains  . Protein electrophoresis, serum      Derek Jack, MD [place] [phone]

## 2019-01-12 NOTE — Patient Instructions (Addendum)
[facility] at [facility] Discharge Instructions  You were seen today by Dr. Katragadda. He went over your recent lab results. He will see you back in 6 months for labs and follow up.   Thank you for choosing [facility] at [facility] to provide your oncology and hematology care.  To afford each patient quality time with our provider, please arrive at least 15 minutes before your scheduled appointment time.   If you have a lab appointment with the Cancer Center please come in thru the  Main Entrance and check in at the main information desk  You need to re-schedule your appointment should you arrive 10 or more minutes late.  We strive to give you quality time with our providers, and arriving late affects you and other patients whose appointments are after yours.  Also, if you no show three or more times for appointments you may be dismissed from the clinic at the providers discretion.     Again, thank you for choosing [facility].  Our hope is that these requests will decrease the amount of time that you wait before being seen by our physicians.       _____________________________________________________________  Should you have questions after your visit to [facility], please contact our office at [phone] between the hours of 8:00 a.m. and 4:30 p.m.  Voicemails left after 4:00 p.m. will not be returned until the following business day.  For prescription refill requests, have your pharmacy contact our office and allow 72 hours.    Cancer Center Support Programs:   > Cancer Support Group  2nd Tuesday of the month 1pm-2pm, Journey Room    

## 2019-02-15 ENCOUNTER — Other Ambulatory Visit: Payer: Self-pay

## 2019-02-15 ENCOUNTER — Emergency Department (HOSPITAL_COMMUNITY)
Admission: EM | Admit: 2019-02-15 | Discharge: 2019-02-15 | Disposition: A | Discharge status: Home / Self Care | Payer: Self-pay | Attending: Emergency Medicine | Admitting: Emergency Medicine

## 2019-02-15 ENCOUNTER — Encounter (HOSPITAL_COMMUNITY): Payer: Self-pay | Admitting: Emergency Medicine

## 2019-02-15 ENCOUNTER — Emergency Department (HOSPITAL_COMMUNITY): Payer: Self-pay

## 2019-02-15 DIAGNOSIS — S90129A Contusion of unspecified lesser toe(s) without damage to nail, initial encounter: Secondary | ICD-10-CM

## 2019-02-15 DIAGNOSIS — S90121A Contusion of right lesser toe(s) without damage to nail, initial encounter: Secondary | ICD-10-CM | POA: Insufficient documentation

## 2019-02-15 DIAGNOSIS — F17210 Nicotine dependence, cigarettes, uncomplicated: Secondary | ICD-10-CM | POA: Insufficient documentation

## 2019-02-15 DIAGNOSIS — Y990 Civilian activity done for income or pay: Secondary | ICD-10-CM | POA: Insufficient documentation

## 2019-02-15 DIAGNOSIS — Y939 Activity, unspecified: Secondary | ICD-10-CM | POA: Insufficient documentation

## 2019-02-15 DIAGNOSIS — W208XXA Other cause of strike by thrown, projected or falling object, initial encounter: Secondary | ICD-10-CM | POA: Insufficient documentation

## 2019-02-15 DIAGNOSIS — Z8571 Personal history of Hodgkin lymphoma: Secondary | ICD-10-CM | POA: Insufficient documentation

## 2019-02-15 DIAGNOSIS — Z79899 Other long term (current) drug therapy: Secondary | ICD-10-CM | POA: Insufficient documentation

## 2019-02-15 DIAGNOSIS — Y929 Unspecified place or not applicable: Secondary | ICD-10-CM | POA: Insufficient documentation

## 2019-02-15 NOTE — Discharge Instructions (Signed)
You can take Tylenol or Ibuprofen as directed for pain. You can alternate Tylenol and Ibuprofen every 4 hours. If you take Tylenol at 1pm, then you can take Ibuprofen at 5pm. Then you can take Tylenol again at 9pm.   Follow the RICE (Rest, Ice, Compression, Elevation) protocol as directed.   Return to the Emergency Department for any worsening pain, redness or swelling of the toe or any other worsening or concerning symptoms.

## 2019-02-15 NOTE — ED Provider Notes (Signed)
[facility] EMERGENCY DEPARTMENT Provider Note   CSN: [id number] Arrival date & time: 02/15/19  1258    History   Chief Complaint Chief Complaint  Patient presents with  . Foot Pain    HPI Robert Acevedo is a 48 y.o. male who presents for evaluation of right fifth toe pain, bruising, swelling after injuring it yesterday.  He reports that while at work, he had a tire drop on his foot.  He states since then, he has had pain to the right fifth digit.  He states that this pain is worsened by by walking.  He denies any numbness.     The history is provided by the patient.    Past Medical History:  Diagnosis Date  . Bronchitis   . Hodgkin's lymphoma ([place])   . Lymphadenopathy, inguinal    left    Patient Active Problem List   Diagnosis Date Noted  . Abdominal pain, colicky 94/58/5929  . Colon cancer screening   . Poor appetite 03/30/2018  . Abdominal pain, epigastric 03/30/2018  . Loss of weight 03/30/2018  . 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 Nuclear sclerotic cataract of both eyes 12/03/2016  . 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 Proliferative retinopathy, non-diabetic, bilateral 12/03/2016  . Retinal edema 12/03/2016  . Port catheter in place 02/05/2016  . Uveitis 08/15/2015  . Hodgkin lymphoma ([place]) 01/23/2015  . Lytic bone lesion of hip   . Abnormal presence of protein in urine     Past Surgical History:  Procedure Laterality Date  . BONE MARROW ASPIRATION Left 01/20/15  . BONE MARROW BIOPSY Left 01/20/15  . COLONOSCOPY N/A 06/16/2018   Procedure: COLONOSCOPY;  Surgeon: Danie Binder, MD;  [facility];  Service: Endoscopy;  Laterality: N/A;  1:15pm  . ESOPHAGOGASTRODUODENOSCOPY N/A 06/16/2018   Procedure: ESOPHAGOGASTRODUODENOSCOPY (EGD);  Surgeon: Danie Binder, MD;  Location: [facility];  Service: Endoscopy;  Laterality: N/A;  . INCISION AND DRAINAGE PERITONSILLAR ABSCESS    . KNEE SURGERY    . LYMPH NODE BIOPSY Left  01/13/2015   Procedure: LEFT INGUINAL LYMPH NODE BIOPSY;  Surgeon: Aviva Signs Md, MD;  Location: [facility] ORS;  Service: General;  Laterality: Left;  . PORT-A-CATH REMOVAL Left 09/08/2016   Procedure: MINOR REMOVAL PORT-A-CATH;  Surgeon: Aviva Signs, MD;  Location: [facility] ORS;  Service: General;  Laterality: Left;  . PORTACATH PLACEMENT Left 01/31/2015   Procedure: INSERTION PORT-A-CATH;  Surgeon: Aviva Signs Md, MD;  Location: [facility] ORS;  Service: General;  Laterality: Left;  . WRIST SURGERY     right        Home Medications    Prior to Admission medications   Medication Sig Start Date End Date Taking? Authorizing Provider  Calcium Carbonate Antacid (TUMS PO) Take 2 tablets by mouth as needed.    [provider]  Multiple Vitamin (MULTIVITAMIN WITH MINERALS) TABS tablet Take 1 tablet by mouth daily.    [provider]  ondansetron (ZOFRAN) 4 MG tablet Take 1 tablet (4 mg total) by mouth every 6 (six) hours as needed. 10/03/44   Delora Fuel, MD  pantoprazole (PROTONIX) 40 MG tablet 1 po 30 mins prior to [place] 10/26/18   Fields, Marga Melnick, MD  Simethicone (GAS-X PO) Take by mouth as needed.    [provider]  traMADol (ULTRAM) 50 MG tablet Take 1 tablet (50 mg total) by mouth every 6 (six) hours as needed. Patient not taking: Reported on 10/08/6379 03/05/10   Delora Fuel,  MD    Family History Family History  Problem Relation Age of Onset  . Seizures Mother   . Diabetes Father     Social History Social History   Tobacco Use  . Smoking status: Current Every Day Smoker    Packs/day: 0.50    Years: 2.00    Pack years: 1.00    Types: Cigarettes  . Smokeless tobacco: Never Used  Substance Use Topics  . Alcohol use: Yes    Comment: occasional  . Drug use: Yes    Types: Marijuana    Comment: sometimes     Allergies   Lactose intolerance (gi) and Clindamycin/lincomycin   Review of Systems Review of Systems  Musculoskeletal:       Toe pain  Neurological:  Negative for numbness.  All other systems reviewed and are negative.    Physical Exam Updated Vital Signs BP 99/61   Pulse 65   Temp 98.1 F (36.7 C) (Oral)   Resp 16   SpO2 99%   Physical Exam Vitals signs and nursing note reviewed.  Constitutional:      Appearance: He is well-developed.  HENT:     Head: Normocephalic and atraumatic.  Eyes:     General: No scleral icterus.       Right eye: No discharge.        Left eye: No discharge.     Conjunctiva/sclera: Conjunctivae normal.  Cardiovascular:     Pulses:          Dorsalis pedis pulses are 2+ on the right side and 2+ on the left side.  Pulmonary:     Effort: Pulmonary effort is normal.  Musculoskeletal:     Comments: Tenderness palpation noted to the mid aspect of the right fifth digit.  No deformity or crepitus noted.  There is some mild overlying soft tissue swelling and ecchymosis.  Nail is intact.  No signs of subungual hematoma.  Skin:    General: Skin is warm and dry.     Capillary Refill: Capillary refill takes less than 2 seconds.     Comments: Good distal cap refill. RLE is not dusky in appearance or cool to touch.  Neurological:     Mental Status: He is alert.  Psychiatric:        Speech: Speech normal.        Behavior: Behavior normal.      ED Treatments / Results  Labs (all labs ordered are listed, but only abnormal results are displayed) Labs Reviewed - No data to display  EKG None  Radiology Dg Toe 5th Right  Result Date: 02/15/2019 CLINICAL DATA:  Pain after trauma. EXAM: RIGHT FIFTH TOE COMPARISON:  None. FINDINGS: There is no evidence of fracture or dislocation. There is no evidence of arthropathy or other focal bone abnormality. Soft tissues are unremarkable. IMPRESSION: Negative. Electronically Signed   By: Dorise Bullion III M.D   On: 02/15/2019 13:47    Procedures Procedures (including critical care time)  Medications Ordered in ED Medications - No data to display   Initial  Impression / Assessment and Plan / ED Course  I have reviewed the triage vital signs and the nursing notes.  Pertinent labs & imaging results that were available during my care of the patient were reviewed by me and considered in my medical decision making (see chart for details).        48 year old male who presents for evaluation of right fifth toe pain after having a tire fall yesterday.  Reports pain is worse with walking. Patient is afebrile, non-toxic appearing, sitting comfortably on examination table. Vital signs reviewed and stable. Patient is neurovascularly intact.  Concern for contusion versus fracture versus dislocation.  X-rays ordered at triage.  X-rays reviewed.  Negative for any acute bony abnormality.  Discussed results with patient.  Encouraged at home supportive care measures. At this time, patient exhibits no emergent life-threatening condition that require further evaluation in ED or admission. Patient had ample opportunity for questions and discussion. All patient's questions were answered with full understanding. Strict return precautions discussed. Patient expresses understanding and agreement to plan.   Portions of this note were generated with [employer]. Dictation errors may occur despite best attempts at proofreading.   Final Clinical Impressions(s) / ED Diagnoses   Final diagnoses:  Contusion of toe without damage to nail, unspecified toe, initial encounter    ED Discharge Orders    None       Volanda Napoleon, PA-C 02/15/19 1400    Margette Fast, MD 02/15/19 [phone]

## 2019-02-15 NOTE — ED Triage Notes (Signed)
Patient reports dropping a tire rim on his foot yesterday. Injury to small toe on his R foot.

## 2019-05-29 ENCOUNTER — Encounter: Payer: Self-pay | Admitting: Gastroenterology

## 2019-07-01 ENCOUNTER — Encounter (HOSPITAL_COMMUNITY): Payer: Self-pay | Admitting: Emergency Medicine

## 2019-07-01 ENCOUNTER — Other Ambulatory Visit: Payer: Self-pay

## 2019-07-01 ENCOUNTER — Emergency Department (HOSPITAL_COMMUNITY): Payer: Self-pay

## 2019-07-01 ENCOUNTER — Emergency Department (HOSPITAL_COMMUNITY)
Admission: EM | Admit: 2019-07-01 | Discharge: 2019-07-01 | Disposition: A | Discharge status: Home / Self Care | Payer: Self-pay | Attending: Emergency Medicine | Admitting: Emergency Medicine

## 2019-07-01 DIAGNOSIS — M7989 Other specified soft tissue disorders: Secondary | ICD-10-CM

## 2019-07-01 DIAGNOSIS — F17210 Nicotine dependence, cigarettes, uncomplicated: Secondary | ICD-10-CM | POA: Insufficient documentation

## 2019-07-01 DIAGNOSIS — M79601 Pain in right arm: Secondary | ICD-10-CM | POA: Insufficient documentation

## 2019-07-01 DIAGNOSIS — Z79899 Other long term (current) drug therapy: Secondary | ICD-10-CM | POA: Insufficient documentation

## 2019-07-01 DIAGNOSIS — R2231 Localized swelling, mass and lump, right upper limb: Secondary | ICD-10-CM | POA: Insufficient documentation

## 2019-07-01 LAB — BASIC METABOLIC PANEL
Anion gap: 8 (ref 5–15)
BUN: 15 mg/dL (ref 6–20)
CO2: 26 mmol/L (ref 22–32)
Calcium: 9 mg/dL (ref 8.9–10.3)
Chloride: 101 mmol/L (ref 98–111)
Creatinine, Ser: 1.15 mg/dL (ref 0.61–1.24)
GFR calc Af Amer: >60 mL/min (ref >60)
GFR calc non Af Amer: >60 mL/min (ref >60)
Glucose, Bld: 88 mg/dL (ref 70–99)
Potassium: 3.7 mmol/L (ref 3.5–5.1)
Sodium: 135 mmol/L (ref 135–145)

## 2019-07-01 LAB — CBC WITH DIFFERENTIAL/PLATELET
Abs Immature Granulocytes: 0.01 10*3/uL (ref 0.00–0.07)
Basophils Absolute: 0 10*3/uL (ref 0.0–0.1)
Basophils Relative: 1 %
Eosinophils Absolute: 0 10*3/uL (ref 0.0–0.5)
Eosinophils Relative: 0 %
HCT: 41.6 % (ref 39.0–52.0)
Hemoglobin: 13.1 g/dL (ref 13.0–17.0)
Immature Granulocytes: 0 %
Lymphocytes Relative: 40 %
Lymphs Abs: 2.2 10*3/uL (ref 0.7–4.0)
MCH: 28.4 pg (ref 26.0–34.0)
MCHC: 31.5 g/dL (ref 30.0–36.0)
MCV: 90 fL (ref 80.0–100.0)
Monocytes Absolute: 0.7 10*3/uL (ref 0.1–1.0)
Monocytes Relative: 13 %
Neutro Abs: 2.6 10*3/uL (ref 1.7–7.7)
Neutrophils Relative %: 46 %
Platelets: 210 10*3/uL (ref 150–400)
RBC: 4.62 MIL/uL (ref 4.22–5.81)
RDW: 13.7 % (ref 11.5–15.5)
WBC: 5.6 10*3/uL (ref 4.0–10.5)
nRBC: 0 % (ref 0.0–0.2)

## 2019-07-01 LAB — C-REACTIVE PROTEIN: CRP: 1.3 mg/dL — ABNORMAL HIGH (ref <1.0)

## 2019-07-01 MED ORDER — IOHEXOL 350 MG/ML SOLN
100.0000 mL | Freq: Once | INTRAVENOUS | Status: AC | PRN
  Administered 2019-07-01: 100 mL via INTRAVENOUS

## 2019-07-01 MED ORDER — MORPHINE SULFATE (PF) 4 MG/ML IV SOLN
4.0000 mg | Freq: Once | INTRAVENOUS | Status: AC
  Administered 2019-07-01: 4 mg via INTRAVENOUS
  Filled 2019-07-01: qty 1

## 2019-07-01 MED ORDER — SULFAMETHOXAZOLE-TRIMETHOPRIM 800-160 MG PO TABS: 2.0000 | ORAL_TABLET | Freq: Two times a day (BID) | ORAL | 0 refills | Status: AC

## 2019-07-01 MED ORDER — FENTANYL CITRATE (PF) 100 MCG/2ML IJ SOLN
50.0000 ug | Freq: Once | INTRAMUSCULAR | Status: AC
  Administered 2019-07-01: 50 ug via INTRAVENOUS
  Filled 2019-07-01: qty 2

## 2019-07-01 MED ORDER — OXYCODONE-ACETAMINOPHEN 5-325 MG PO TABS: 1.0000 | ORAL_TABLET | Freq: Four times a day (QID) | ORAL | 0 refills | Status: AC | PRN

## 2019-07-01 NOTE — ED Notes (Signed)
EDP at bedside,  

## 2019-07-01 NOTE — Discharge Instructions (Signed)
Recommend warm compresses to affected area.  Can take Tylenol, Motrin for pain control.  For breakthrough pain, can use the prescribed Percocet.  Recommend taking antibiotics for possibility of infection.  Please come back tomorrow for the ultrasound study to evaluate for the possibility of a blood clot.  If you develop worsening pain, swelling, redness, fever, you should return to ER for reassessment.  Recommend recheck with your primary doctor in 48 hours.  If you are unable to get to a primary care doctor you can come back here for recheck.

## 2019-07-01 NOTE — ED Provider Notes (Signed)
[facility] EMERGENCY DEPARTMENT Provider Note   CSN: [id number] Arrival date & time: 07/01/19  1320     History   Chief Complaint Chief Complaint  Patient presents with  . Arm Pain    HPI Robert Acevedo is a 48 y.o. male.  Presents emerged department with chief complaint of right arm pain.  Patient states he is noted pain since last Thursday.  States pain has been steadily progressing.  Now severe, hurts with any sort of touch, worse with movement.  Burning sensation.  Radiates down his arm.  He denies fever.  No IVDU.  Denies recent injury.  Past medical history Hodgkin's lymphoma in remission.     HPI  Past Medical History:  Diagnosis Date  . Bronchitis   . Hodgkin's lymphoma ([place])   . Lymphadenopathy, inguinal    left    Patient Active Problem List   Diagnosis Date Noted  . Abdominal pain, colicky 82/50/0370  . Colon cancer screening   . Poor appetite 03/30/2018  . Abdominal pain, epigastric 03/30/2018  . Loss of weight 03/30/2018  . 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 Nuclear sclerotic cataract of both eyes 12/03/2016  . 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 Proliferative retinopathy, non-diabetic, bilateral 12/03/2016  . Retinal edema 12/03/2016  . Port catheter in place 02/05/2016  . Uveitis 08/15/2015  . Hodgkin lymphoma ([place]) 01/23/2015  . Lytic bone lesion of hip   . Abnormal presence of protein in urine     Past Surgical History:  Procedure Laterality Date  . BONE MARROW ASPIRATION Left 01/20/15  . BONE MARROW BIOPSY Left 01/20/15  . COLONOSCOPY N/A 06/16/2018   Procedure: COLONOSCOPY;  Surgeon: Danie Binder, MD;  [facility];  Service: Endoscopy;  Laterality: N/A;  1:15pm  . ESOPHAGOGASTRODUODENOSCOPY N/A 06/16/2018   Procedure: ESOPHAGOGASTRODUODENOSCOPY (EGD);  Surgeon: Danie Binder, MD;  Location: [facility];  Service: Endoscopy;  Laterality: N/A;  . INCISION AND DRAINAGE PERITONSILLAR ABSCESS    . KNEE  SURGERY    . LYMPH NODE BIOPSY Left 01/13/2015   Procedure: LEFT INGUINAL LYMPH NODE BIOPSY;  Surgeon: Aviva Signs Md, MD;  Location: [facility] ORS;  Service: General;  Laterality: Left;  . PORT-A-CATH REMOVAL Left 09/08/2016   Procedure: MINOR REMOVAL PORT-A-CATH;  Surgeon: Aviva Signs, MD;  Location: [facility] ORS;  Service: General;  Laterality: Left;  . PORTACATH PLACEMENT Left 01/31/2015   Procedure: INSERTION PORT-A-CATH;  Surgeon: Aviva Signs Md, MD;  Location: [facility] ORS;  Service: General;  Laterality: Left;  . WRIST SURGERY     right        Home Medications    Prior to Admission medications   Medication Sig Start Date End Date Taking? Authorizing Provider  Calcium Carbonate Antacid (TUMS PO) Take 2 tablets by mouth as needed.    [provider]  Multiple Vitamin (MULTIVITAMIN WITH MINERALS) TABS tablet Take 1 tablet by mouth daily.    [provider]  ondansetron (ZOFRAN) 4 MG tablet Take 1 tablet (4 mg total) by mouth every 6 (six) hours as needed. 12/06/86   Delora Fuel, MD  pantoprazole (PROTONIX) 40 MG tablet 1 po 30 mins prior to [place] 10/26/18   Fields, Marga Melnick, MD  Simethicone (GAS-X PO) Take by mouth as needed.    [provider]  traMADol (ULTRAM) 50 MG tablet Take 1 tablet (50 mg total) by mouth every 6 (six) hours as needed. Patient not taking: Reported on 04/30/6944 0/3/88   Delora Fuel,  MD    Family History Family History  Problem Relation Age of Onset  . Seizures Mother   . Diabetes Father     Social History Social History   Tobacco Use  . Smoking status: Current Every Day Smoker    Packs/day: 0.50    Years: 2.00    Pack years: 1.00    Types: Cigarettes  . Smokeless tobacco: Never Used  Substance Use Topics  . Alcohol use: Yes    Comment: occasional  . Drug use: Yes    Types: Marijuana    Comment: sometimes     Allergies   Lactose intolerance (gi) and Clindamycin/lincomycin   Review of Systems Review of Systems   Constitutional: Negative for chills and fever.  HENT: Negative for ear pain and sore throat.   Eyes: Negative for pain and visual disturbance.  Respiratory: Negative for cough and shortness of breath.   Cardiovascular: Negative for chest pain and palpitations.  Gastrointestinal: Negative for abdominal pain and vomiting.  Genitourinary: Negative for dysuria and hematuria.  Musculoskeletal: Positive for arthralgias. Negative for back pain.  Skin: Negative for color change and rash.  Neurological: Negative for seizures and syncope.  All other systems reviewed and are negative.    Physical Exam Updated Vital Signs BP 102/81 (BP Location: Left Arm)   Pulse 71   Temp 99.5 F (37.5 C) (Oral)   Resp 16   Ht '5\' 9"'  (1.753 m)   Wt 78 kg   SpO2 100%   BMI 25.40 kg/m   Physical Exam Vitals signs and nursing note reviewed.  Constitutional:      Appearance: He is well-developed.  HENT:     Head: Normocephalic and atraumatic.  Eyes:     Conjunctiva/sclera: Conjunctivae normal.  Neck:     Musculoskeletal: Neck supple.  Cardiovascular:     Rate and Rhythm: Normal rate and regular rhythm.     Heart sounds: No murmur.  Pulmonary:     Effort: Pulmonary effort is normal. No respiratory distress.     Breath sounds: Normal breath sounds.  Abdominal:     Palpations: Abdomen is soft.     Tenderness: There is no abdominal tenderness.  Musculoskeletal:     Comments: RUE: There is swelling noted over the distal lateral upper arm, firm area of induration approximately 2-3cm in diameter, no overlying erythema noted; patient has full range of motion of his elbow, does report some pain on full extension and full flexion; normal distal radial pulse, distal sensation and motor intact; no erythema noted, no streaking noted  Skin:    General: Skin is warm and dry.  Neurological:     Mental Status: He is alert.      ED Treatments / Results  Labs (all labs ordered are listed, but only abnormal  results are displayed) Labs Reviewed  CBC WITH DIFFERENTIAL/PLATELET  C-REACTIVE PROTEIN  BASIC METABOLIC PANEL    EKG None  Radiology No results found.  Procedures Procedures (including critical care time)  Medications Ordered in ED Medications  iohexol (OMNIPAQUE) 350 MG/ML injection 100 mL (has no administration in time range)  fentaNYL (SUBLIMAZE) injection 50 mcg (50 mcg Intravenous Given 07/01/19 1547)     Initial Impression / Assessment and Plan / ED Course  I have reviewed the triage vital signs and the nursing notes.  Pertinent labs & imaging results that were available during my care of the patient were reviewed by me and considered in my medical decision making (see chart for  details).        48 year old male presents to ER with chief complaint right arm pain for the past week.  On exam noted slightly swollen area over his distal forearm. No joint involvement. No erythema or skin changes to suggest cellulitis or clear abscess.  Labs were obtained which showed normal white count, mild elevation in CRP.  CT imaging negative for discrete abscess.  Recommend that patient come back tomorrow morning for an ultrasound to further evaluate, rule out blood clot.  Will give trial of antibiotics in case this is cellulitis.  Recommended warm compresses.  Recommended recheck with his primary doctor in 24 to 48 hours.    After the discussed management above, the patient was determined to be safe for discharge.  The patient was in agreement with this plan and all questions regarding their care were answered.  ED return precautions were discussed and the patient will return to the ED with any significant worsening of condition.    Final Clinical Impressions(s) / ED Diagnoses   Final diagnoses:  Right arm pain  Arm swelling    ED Discharge Orders    None       Lucrezia Starch, MD 07/01/19 2339

## 2019-07-01 NOTE — ED Triage Notes (Signed)
Pt reports pain in right arm since last Thursday. Pt reports continued to go to work and pain persisted but reports woke up this morning and states "arm is burning and is warm to touch, hurts to move." radial pulse strong. Cap refill wnl. Warmth noted from bicep to mid forearm on RUE. Pt denies any known injury.

## 2019-07-01 NOTE — ED Notes (Signed)
Pt expressed understanding of discharge instructions and follow up,

## 2019-07-02 ENCOUNTER — Ambulatory Visit (HOSPITAL_COMMUNITY)
Admission: RE | Admit: 2019-07-02 | Discharge: 2019-07-02 | Disposition: A | Discharge status: Home / Self Care | Payer: Self-pay | Source: Ambulatory Visit | Attending: Emergency Medicine | Admitting: Emergency Medicine

## 2019-07-02 DIAGNOSIS — M7989 Other specified soft tissue disorders: Secondary | ICD-10-CM | POA: Insufficient documentation

## 2019-07-02 NOTE — ED Notes (Signed)
Verbal order from [facility] MD to give pt an updated work note

## 2019-07-02 NOTE — ED Notes (Signed)
Pt returned call to er advising that [facility] was not able to refill the narcotic prescription, RN spoke with Beau Fanny at [facility] in [place] who advised that percocet's were on back order in [facility], did advise that they had norco if edp would want to switch, RN spoke with Dr Lacinda Axon who advised that he would write a new prescription for norco 5-325 with a quantity of 15, Dr Lacinda Axon attempted to escribe prescription without success, paper prescription written and placed with ed secretary, pt call at [phone] and advised to come to er to pick up prescription, Tameka at [facility] notified of new prescription and to void the previous percocet prescription,

## 2019-07-02 NOTE — ED Provider Notes (Signed)
Discussed Doppler study of right upper extremity with patient.  I also discussed the findings with the radiologist.  We will continue antibiotics for now.  He has an appointment with the oncology doctor in mid November.  He will return here for fever, chills, worsening pain or increased dimensions of lesion.   Nat Christen, MD 07/02/19 1235

## 2019-07-05 ENCOUNTER — Other Ambulatory Visit: Payer: Self-pay

## 2019-07-05 ENCOUNTER — Ambulatory Visit (HOSPITAL_COMMUNITY)
Admission: EM | Admit: 2019-07-05 | Discharge: 2019-07-05 | Disposition: A | Discharge status: Home / Self Care | Payer: Self-pay | Attending: Emergency Medicine | Admitting: Emergency Medicine

## 2019-07-05 ENCOUNTER — Encounter (HOSPITAL_COMMUNITY): Payer: Self-pay

## 2019-07-05 DIAGNOSIS — L03113 Cellulitis of right upper limb: Secondary | ICD-10-CM | POA: Diagnosis present

## 2019-07-05 DIAGNOSIS — F1290 Cannabis use, unspecified, uncomplicated: Secondary | ICD-10-CM | POA: Diagnosis present

## 2019-07-05 DIAGNOSIS — F17210 Nicotine dependence, cigarettes, uncomplicated: Secondary | ICD-10-CM | POA: Diagnosis present

## 2019-07-05 MED ORDER — CEPHALEXIN 500 MG PO CAPS: 500.0000 mg | ORAL_CAPSULE | Freq: Four times a day (QID) | ORAL | 0 refills | Status: AC

## 2019-07-05 NOTE — ED Provider Notes (Signed)
[facility]    CSN: [id number] Arrival date & time: 07/05/19  1216      History   Chief Complaint Chief Complaint  Patient presents with  . Insect Bite    HPI Robert Acevedo is a 48 y.o. male.   48 yr old AA male presents to UC for "second opinion" of right arm swelling/pain. Pt reports 1 week history of right inner AC swelling, denies injury or known trauma. Evaluated at [facility] and advised cellulitis, possible insect bite. Pt denies IVDU, report +0.5ppd smoker, + smells and admits to recent marijuanna use.  Pt has been on abx for 2 days(bactrim) with minimal improvement, pt has right arm in sling hanging at waist.   The history is provided by the patient. No language interpreter was used.    Past Medical History:  Diagnosis Date  . Bronchitis   . Hodgkin's lymphoma ([place])   . Lymphadenopathy, inguinal    left    Patient Active Problem List   Diagnosis Date Noted  . Cellulitis of right upper extremity 07/05/2019  . Cigarette smoker 07/05/2019  . Marijuana smoker 07/05/2019  . Abdominal pain, colicky 35/59/7416  . Colon cancer screening   . Poor appetite 03/30/2018  . Abdominal pain, epigastric 03/30/2018  . Loss of weight 03/30/2018  . 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 Nuclear sclerotic cataract of both eyes 12/03/2016  . 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 Proliferative retinopathy, non-diabetic, bilateral 12/03/2016  . Retinal edema 12/03/2016  . Port catheter in place 02/05/2016  . Uveitis 08/15/2015  . Hodgkin lymphoma ([place]) 01/23/2015  . Lytic bone lesion of hip   . Abnormal presence of protein in urine     Past Surgical History:  Procedure Laterality Date  . BONE MARROW ASPIRATION Left 01/20/15  . BONE MARROW BIOPSY Left 01/20/15  . COLONOSCOPY N/A 06/16/2018   Procedure: COLONOSCOPY;  Surgeon: Danie Binder, MD;  [facility];  Service: Endoscopy;  Laterality: N/A;  1:15pm  . ESOPHAGOGASTRODUODENOSCOPY N/A  06/16/2018   Procedure: ESOPHAGOGASTRODUODENOSCOPY (EGD);  Surgeon: Danie Binder, MD;  Location[facility];  Service: Endoscopy;  Laterality: N/A;  . INCISION AND DRAINAGE PERITONSILLAR ABSCESS    . KNEE SURGERY    . LYMPH NODE BIOPSY Left 01/13/2015   Procedure: LEFT INGUINAL LYMPH NODE BIOPSY;  Surgeon: Aviva Signs Md, MD;  Location: [facility] ORS;  Service: General;  Laterality: Left;  . PORT-A-CATH REMOVAL Left 09/08/2016   Procedure: MINOR REMOVAL PORT-A-CATH;  Surgeon: Aviva Signs, MD;  Location: [facility] ORS;  Service: General;  Laterality: Left;  . PORTACATH PLACEMENT Left 01/31/2015   Procedure: INSERTION PORT-A-CATH;  Surgeon: Aviva Signs Md, MD;  Location: [facility] ORS;  Service: General;  Laterality: Left;  . WRIST SURGERY     right       Home Medications    Prior to Admission medications   Medication Sig Start Date End Date Taking? Authorizing Provider  Calcium Carbonate Antacid (TUMS PO) Take 2 tablets by mouth as needed.    [provider]  cephALEXin (KEFLEX) 500 MG capsule Take 1 capsule (500 mg total) by mouth 4 (four) times daily for 5 days. 38/4/53 64/68/03  Ausha Sieh, Jeanett Schlein, NP  Multiple Vitamin (MULTIVITAMIN WITH MINERALS) TABS tablet Take 1 tablet by mouth daily.    [provider]  ondansetron (ZOFRAN) 4 MG tablet Take 1 tablet (4 mg total) by mouth every 6 (six) hours as needed. Patient not taking: Reported on 07/01/2019 09/06/18  Delora Fuel, MD  pantoprazole (PROTONIX) 40 MG tablet 1 po 30 mins prior to [place] 10/26/18   Fields, Marga Melnick, MD  Simethicone (GAS-X PO) Take by mouth as needed.    [provider]  sulfamethoxazole-trimethoprim (BACTRIM DS) 800-160 MG tablet Take 2 tablets by mouth 2 (two) times daily for 7 days. 07/01/19 07/08/19  Lucrezia Starch, MD  traMADol (ULTRAM) 50 MG tablet Take 1 tablet (50 mg total) by mouth every 6 (six) hours as needed. Patient not taking: Reported on 11/30/3293 09/06/82   Delora Fuel, MD    Family  History Family History  Problem Relation Age of Onset  . Seizures Mother   . Diabetes Father     Social History Social History   Tobacco Use  . Smoking status: Current Every Day Smoker    Packs/day: 0.50    Years: 2.00    Pack years: 1.00    Types: Cigarettes  . Smokeless tobacco: Never Used  Substance Use Topics  . Alcohol use: Yes    Comment: occasional  . Drug use: Yes    Types: Marijuana    Comment: sometimes     Allergies   Lactose intolerance (gi) and Clindamycin/lincomycin   Review of Systems Review of Systems  Constitutional: Negative for chills and fever.  HENT: Negative for ear pain and sore throat.   Eyes: Negative for pain and visual disturbance.  Respiratory: Negative for cough and shortness of breath.   Cardiovascular: Negative for chest pain and palpitations.  Gastrointestinal: Negative for abdominal pain and vomiting.  Genitourinary: Negative for dysuria and hematuria.  Musculoskeletal: Positive for arthralgias, joint swelling and myalgias. Negative for back pain.  Skin: Positive for color change. Negative for rash.  Neurological: Negative for seizures and syncope.  All other systems reviewed and are negative.    Physical Exam Triage Vital Signs ED Triage Vitals  Enc Vitals Group     BP 07/05/19 1232 111/68     Pulse Rate 07/05/19 1232 81     Resp 07/05/19 1232 18     Temp 07/05/19 1232 99 F (37.2 C)     Temp Source 07/05/19 1232 Oral     SpO2 07/05/19 1232 100 %     Weight 07/05/19 1233 173 lb (78.5 kg)     Height --      Head Circumference --      Peak Flow --      Pain Score 07/05/19 1233 10     Pain Loc --      Pain Edu? --      Excl. in [place]? --    No data found.  Updated Vital Signs BP 111/68 (BP Location: Right Arm)   Pulse 81   Temp 99 F (37.2 C) (Oral)   Resp 18   Wt 173 lb (78.5 kg)   SpO2 100%   BMI 25.55 kg/m   Visual Acuity Right Eye Distance:   Left Eye Distance:   Bilateral Distance:    Right Eye Near:    Left Eye Near:    Bilateral Near:     Physical Exam Vitals signs and nursing note reviewed.  Constitutional:      General: He is awake.     Appearance: Normal appearance. He is well-developed.  HENT:     Head: Normocephalic and atraumatic.  Eyes:     General: Lids are normal.     Conjunctiva/sclera: Conjunctivae normal.  Neck:     Musculoskeletal: Full passive range of motion  without pain, normal range of motion and neck supple.  Cardiovascular:     Rate and Rhythm: Normal rate and regular rhythm.     Pulses: Normal pulses.          Radial pulses are 2+ on the right side and 2+ on the left side.     Heart sounds: No murmur.  Pulmonary:     Effort: Pulmonary effort is normal. No respiratory distress.     Breath sounds: Normal breath sounds.  Abdominal:     Palpations: Abdomen is soft.     Tenderness: There is no abdominal tenderness.  Musculoskeletal:     Right elbow: He exhibits swelling. He exhibits normal range of motion, no effusion and no deformity. Tenderness found.       Arms:  Skin:    General: Skin is warm and dry.     Capillary Refill: Capillary refill takes less than 2 seconds.  Neurological:     General: No focal deficit present.     Mental Status: He is alert.     GCS: GCS eye subscore is 4. GCS verbal subscore is 5. GCS motor subscore is 6.  Psychiatric:        Attention and Perception: Attention and perception normal.        Mood and Affect: Mood normal.        Speech: Speech normal.        Behavior: Behavior is cooperative.      UC Treatments / Results  Labs (all labs ordered are listed, but only abnormal results are displayed) Labs Reviewed - No data to display  EKG   Radiology No results found.  Procedures Procedures (including critical care time)  Medications Ordered in UC Medications - No data to display  Initial Impression / Assessment and Plan / UC Course  I have reviewed the triage vital signs and the nursing notes.  Pertinent  labs & imaging results that were available during my care of the patient were reviewed by me and considered in my medical decision making (see chart for details).     Discussed DDX w pt: cellulitis, developing DVT, hematoma, abscess. Recommend pt to elevate right arm, warm moist compresses, continue abx adding keflex, follow up with PCP tomorrow-got to ER for new or worsening issues. Pt verbalized understanding to this provider. Final Clinical Impressions(s) / UC Diagnoses   Final diagnoses:  Cellulitis of right upper extremity  Cigarette smoker  Marijuana smoker     Discharge Instructions     Rest, elevate right arm higher than heart, warm moist compresses to right arm 20 minutes three times daily. Continue Bactrim,adding keflex antibiotic as well. You must follow up tomorrow with your provider for recheck of area may need repeat US and or IV antibiotics. May take over the counter tylenol and or ibuprofen for pain(as label directed for dosing). Go to ER for new or worsening issues streaking, nausea,vomiting, fever, over 101, etc    ED Prescriptions    Medication Sig Dispense Auth. Provider   cephALEXin (KEFLEX) 500 MG capsule Take 1 capsule (500 mg total) by mouth 4 (four) times daily for 5 days. 20 capsule Elmer Merwin, Jeanett Schlein, NP     PDMP not reviewed this encounter.   Tori Milks, NP 61/44/31 1449

## 2019-07-05 NOTE — Discharge Instructions (Addendum)
Rest, elevate right arm higher than heart, warm moist compresses to right arm 20 minutes three times daily. Continue Bactrim,adding keflex antibiotic as well. You must follow up tomorrow with your provider for recheck of area may need repeat US and or IV antibiotics. May take over the counter tylenol and or ibuprofen for pain(as label directed for dosing). Go to ER for new or worsening issues streaking, nausea,vomiting, fever, over 101, etc

## 2019-07-05 NOTE — ED Triage Notes (Signed)
Pt states he went to the ER at [facility]. Pt states he was told that he may have a insect bite on his right elbow.

## 2019-07-06 NOTE — ED Notes (Signed)
Pt called in to request a refill of hydrocodone.  Instructed patient that the doctors do not refill narcotics and that he would need to come back in for a recheck of his problem.

## 2019-07-09 ENCOUNTER — Inpatient Hospital Stay (HOSPITAL_COMMUNITY): Payer: Self-pay | Attending: Hematology

## 2019-07-09 ENCOUNTER — Telehealth (HOSPITAL_COMMUNITY): Payer: Self-pay

## 2019-07-09 ENCOUNTER — Emergency Department (HOSPITAL_COMMUNITY)
Admission: EM | Admit: 2019-07-09 | Discharge: 2019-07-09 | Disposition: A | Discharge status: Home / Self Care | Payer: Self-pay | Attending: Emergency Medicine | Admitting: Emergency Medicine

## 2019-07-09 ENCOUNTER — Encounter (HOSPITAL_COMMUNITY): Payer: Self-pay | Admitting: Emergency Medicine

## 2019-07-09 ENCOUNTER — Other Ambulatory Visit: Payer: Self-pay

## 2019-07-09 DIAGNOSIS — Z760 Encounter for issue of repeat prescription: Secondary | ICD-10-CM | POA: Insufficient documentation

## 2019-07-09 DIAGNOSIS — C8195 Hodgkin lymphoma, unspecified, lymph nodes of inguinal region and lower limb: Secondary | ICD-10-CM | POA: Insufficient documentation

## 2019-07-09 DIAGNOSIS — C8190 Hodgkin lymphoma, unspecified, unspecified site: Secondary | ICD-10-CM

## 2019-07-09 DIAGNOSIS — Z5321 Procedure and treatment not carried out due to patient leaving prior to being seen by health care provider: Secondary | ICD-10-CM | POA: Insufficient documentation

## 2019-07-09 LAB — CBC WITH DIFFERENTIAL/PLATELET
Abs Immature Granulocytes: 0.03 10*3/uL (ref 0.00–0.07)
Basophils Absolute: 0 10*3/uL (ref 0.0–0.1)
Basophils Relative: 0 %
Eosinophils Absolute: 0.1 10*3/uL (ref 0.0–0.5)
Eosinophils Relative: 1 %
HCT: 40.7 % (ref 39.0–52.0)
Hemoglobin: 12.8 g/dL — ABNORMAL LOW (ref 13.0–17.0)
Immature Granulocytes: 0 %
Lymphocytes Relative: 16 %
Lymphs Abs: 1.7 10*3/uL (ref 0.7–4.0)
MCH: 27.9 pg (ref 26.0–34.0)
MCHC: 31.4 g/dL (ref 30.0–36.0)
MCV: 88.7 fL (ref 80.0–100.0)
Monocytes Absolute: 1.2 10*3/uL — ABNORMAL HIGH (ref 0.1–1.0)
Monocytes Relative: 12 %
Neutro Abs: 7.4 10*3/uL (ref 1.7–7.7)
Neutrophils Relative %: 71 %
Platelets: 271 10*3/uL (ref 150–400)
RBC: 4.59 MIL/uL (ref 4.22–5.81)
RDW: 13.2 % (ref 11.5–15.5)
WBC: 10.4 10*3/uL (ref 4.0–10.5)
nRBC: 0 % (ref 0.0–0.2)

## 2019-07-09 LAB — COMPREHENSIVE METABOLIC PANEL
ALT: 30 U/L (ref 0–44)
AST: 26 U/L (ref 15–41)
Albumin: 3.4 g/dL — ABNORMAL LOW (ref 3.5–5.0)
Alkaline Phosphatase: 60 U/L (ref 38–126)
Anion gap: 9 (ref 5–15)
BUN: 19 mg/dL (ref 6–20)
CO2: 27 mmol/L (ref 22–32)
Calcium: 8.8 mg/dL — ABNORMAL LOW (ref 8.9–10.3)
Chloride: 96 mmol/L — ABNORMAL LOW (ref 98–111)
Creatinine, Ser: 1.19 mg/dL (ref 0.61–1.24)
GFR calc Af Amer: >60 mL/min (ref >60)
GFR calc non Af Amer: >60 mL/min (ref >60)
Glucose, Bld: 103 mg/dL — ABNORMAL HIGH (ref 70–99)
Potassium: 3.7 mmol/L (ref 3.5–5.1)
Sodium: 132 mmol/L — ABNORMAL LOW (ref 135–145)
Total Bilirubin: 0.7 mg/dL (ref 0.3–1.2)
Total Protein: 9.5 g/dL — ABNORMAL HIGH (ref 6.5–8.1)

## 2019-07-09 LAB — LACTATE DEHYDROGENASE: LDH: 137 U/L (ref 98–192)

## 2019-07-09 NOTE — ED Triage Notes (Signed)
Pt states that he was here and treated for cellulitis. He ran out of pain medication on Friday and he is wanting a refill

## 2019-07-10 LAB — PROTEIN ELECTROPHORESIS, SERUM
A/G Ratio: 0.6 — ABNORMAL LOW (ref 0.7–1.7)
Albumin ELP: 3.3 g/dL (ref 2.9–4.4)
Alpha-1-Globulin: 0.3 g/dL (ref 0.0–0.4)
Alpha-2-Globulin: 1.1 g/dL — ABNORMAL HIGH (ref 0.4–1.0)
Beta Globulin: 1.1 g/dL (ref 0.7–1.3)
Gamma Globulin: 2.8 g/dL — ABNORMAL HIGH (ref 0.4–1.8)
Globulin, Total: 5.3 g/dL — ABNORMAL HIGH (ref 2.2–3.9)
Total Protein ELP: 8.6 g/dL — ABNORMAL HIGH (ref 6.0–8.5)

## 2019-07-10 LAB — KAPPA/LAMBDA LIGHT CHAINS
Kappa free light chain: 103.9 mg/L — ABNORMAL HIGH (ref 3.3–19.4)
Kappa, lambda light chain ratio: 2.18 — ABNORMAL HIGH (ref 0.26–1.65)
Lambda free light chains: 47.7 mg/L — ABNORMAL HIGH (ref 5.7–26.3)

## 2019-07-13 ENCOUNTER — Encounter (HOSPITAL_COMMUNITY): Payer: Self-pay | Admitting: Emergency Medicine

## 2019-07-13 ENCOUNTER — Emergency Department (HOSPITAL_COMMUNITY): Payer: Self-pay

## 2019-07-13 ENCOUNTER — Emergency Department (HOSPITAL_COMMUNITY)
Admission: EM | Admit: 2019-07-13 | Discharge: 2019-07-13 | Disposition: A | Discharge status: Home / Self Care | Payer: Self-pay | Attending: Emergency Medicine | Admitting: Emergency Medicine

## 2019-07-13 ENCOUNTER — Other Ambulatory Visit: Payer: Self-pay

## 2019-07-13 DIAGNOSIS — M25521 Pain in right elbow: Secondary | ICD-10-CM | POA: Insufficient documentation

## 2019-07-13 DIAGNOSIS — Z5321 Procedure and treatment not carried out due to patient leaving prior to being seen by health care provider: Secondary | ICD-10-CM | POA: Insufficient documentation

## 2019-07-13 LAB — LACTIC ACID, PLASMA: Lactic Acid, Venous: 0.8 mmol/L (ref 0.5–1.9)

## 2019-07-13 LAB — CBC WITH DIFFERENTIAL/PLATELET
Abs Immature Granulocytes: 0.03 10*3/uL (ref 0.00–0.07)
Basophils Absolute: 0 10*3/uL (ref 0.0–0.1)
Basophils Relative: 1 %
Eosinophils Absolute: 0.2 10*3/uL (ref 0.0–0.5)
Eosinophils Relative: 3 %
HCT: 36.3 % — ABNORMAL LOW (ref 39.0–52.0)
Hemoglobin: 11.7 g/dL — ABNORMAL LOW (ref 13.0–17.0)
Immature Granulocytes: 1 %
Lymphocytes Relative: 26 %
Lymphs Abs: 1.7 10*3/uL (ref 0.7–4.0)
MCH: 28.7 pg (ref 26.0–34.0)
MCHC: 32.2 g/dL (ref 30.0–36.0)
MCV: 89.2 fL (ref 80.0–100.0)
Monocytes Absolute: 1.1 10*3/uL — ABNORMAL HIGH (ref 0.1–1.0)
Monocytes Relative: 17 %
Neutro Abs: 3.5 10*3/uL (ref 1.7–7.7)
Neutrophils Relative %: 52 %
Platelets: 303 10*3/uL (ref 150–400)
RBC: 4.07 MIL/uL — ABNORMAL LOW (ref 4.22–5.81)
RDW: 13.1 % (ref 11.5–15.5)
WBC: 6.4 10*3/uL (ref 4.0–10.5)
nRBC: 0 % (ref 0.0–0.2)

## 2019-07-13 LAB — COMPREHENSIVE METABOLIC PANEL
ALT: 28 U/L (ref 0–44)
AST: 33 U/L (ref 15–41)
Albumin: 2.8 g/dL — ABNORMAL LOW (ref 3.5–5.0)
Alkaline Phosphatase: 50 U/L (ref 38–126)
Anion gap: 9 (ref 5–15)
BUN: 26 mg/dL — ABNORMAL HIGH (ref 6–20)
CO2: 29 mmol/L (ref 22–32)
Calcium: 8.7 mg/dL — ABNORMAL LOW (ref 8.9–10.3)
Chloride: 96 mmol/L — ABNORMAL LOW (ref 98–111)
Creatinine, Ser: 1.15 mg/dL (ref 0.61–1.24)
GFR calc Af Amer: >60 mL/min (ref >60)
GFR calc non Af Amer: >60 mL/min (ref >60)
Glucose, Bld: 104 mg/dL — ABNORMAL HIGH (ref 70–99)
Potassium: 4 mmol/L (ref 3.5–5.1)
Sodium: 134 mmol/L — ABNORMAL LOW (ref 135–145)
Total Bilirubin: 0.4 mg/dL (ref 0.3–1.2)
Total Protein: 8.5 g/dL — ABNORMAL HIGH (ref 6.5–8.1)

## 2019-07-13 MED ORDER — SODIUM CHLORIDE 0.9% FLUSH: 3.0000 mL | Freq: Once | INTRAVENOUS | Status: DC

## 2019-07-13 NOTE — ED Triage Notes (Signed)
Patient here for follow up for his right elbow cellulitis.  Patient having drainage from the elbow.  He took antibiotics and now getting worse.

## 2019-07-13 NOTE — ED Notes (Addendum)
Pt stated he was leaving the Emergency room. Pt advised to stay. Pt left.

## 2019-07-13 NOTE — ED Notes (Addendum)
Pt refused VS and was verbally inappropriate with this EMT.

## 2019-07-16 ENCOUNTER — Ambulatory Visit (HOSPITAL_COMMUNITY): Payer: Self-pay | Admitting: Hematology
